# Patient Record
Sex: Female | Born: 1965 | Race: White | Hispanic: No | Smoking: Never smoker
Health system: Southern US, Community
[De-identification: ages and names within clinical notes are randomized; demographics above are authoritative.]

## PROBLEM LIST (undated history)

## (undated) DIAGNOSIS — F329 Major depressive disorder, single episode, unspecified: Secondary | ICD-10-CM

## (undated) DIAGNOSIS — E663 Overweight: Secondary | ICD-10-CM

## (undated) DIAGNOSIS — K589 Irritable bowel syndrome without diarrhea: Secondary | ICD-10-CM

## (undated) DIAGNOSIS — I509 Heart failure, unspecified: Secondary | ICD-10-CM

## (undated) DIAGNOSIS — E8940 Asymptomatic postprocedural ovarian failure: Secondary | ICD-10-CM

## (undated) DIAGNOSIS — J45909 Unspecified asthma, uncomplicated: Secondary | ICD-10-CM

## (undated) DIAGNOSIS — F32A Depression, unspecified: Secondary | ICD-10-CM

## (undated) DIAGNOSIS — G8929 Other chronic pain: Secondary | ICD-10-CM

## (undated) DIAGNOSIS — E785 Hyperlipidemia, unspecified: Secondary | ICD-10-CM

## (undated) DIAGNOSIS — F419 Anxiety disorder, unspecified: Secondary | ICD-10-CM

## (undated) DIAGNOSIS — M549 Dorsalgia, unspecified: Secondary | ICD-10-CM

## (undated) DIAGNOSIS — G35 Multiple sclerosis: Secondary | ICD-10-CM

## (undated) DIAGNOSIS — M797 Fibromyalgia: Secondary | ICD-10-CM

## (undated) DIAGNOSIS — G40909 Epilepsy, unspecified, not intractable, without status epilepticus: Secondary | ICD-10-CM

## (undated) DIAGNOSIS — M1990 Unspecified osteoarthritis, unspecified site: Secondary | ICD-10-CM

## (undated) DIAGNOSIS — K219 Gastro-esophageal reflux disease without esophagitis: Secondary | ICD-10-CM

## (undated) HISTORY — PX: BRAIN SURGERY: SHX531

## (undated) HISTORY — DX: Overweight: E66.3

## (undated) HISTORY — DX: Irritable bowel syndrome, unspecified: K58.9

## (undated) HISTORY — DX: Fibromyalgia: M79.7

## (undated) HISTORY — DX: Depression, unspecified: F32.A

## (undated) HISTORY — PX: TUBAL LIGATION: SHX77

## (undated) HISTORY — DX: Hyperlipidemia, unspecified: E78.5

## (undated) HISTORY — DX: Other chronic pain: G89.29

## (undated) HISTORY — PX: COLONOSCOPY: SHX174

## (undated) HISTORY — DX: Dorsalgia, unspecified: M54.9

## (undated) HISTORY — DX: Unspecified osteoarthritis, unspecified site: M19.90

## (undated) HISTORY — DX: Epilepsy, unspecified, not intractable, without status epilepticus: G40.909

## (undated) HISTORY — DX: Unspecified asthma, uncomplicated: J45.909

## (undated) HISTORY — PX: RHINOPLASTY: SUR1284

## (undated) HISTORY — PX: CHOLECYSTECTOMY: SHX55

## (undated) HISTORY — DX: Anxiety disorder, unspecified: F41.9

## (undated) HISTORY — DX: Asymptomatic postprocedural ovarian failure: E89.40

## (undated) HISTORY — DX: Gastro-esophageal reflux disease without esophagitis: K21.9

## (undated) HISTORY — DX: Major depressive disorder, single episode, unspecified: F32.9

---

## 1994-09-16 HISTORY — PX: VAGINAL HYSTERECTOMY: SUR661

## 2000-09-16 DIAGNOSIS — E8940 Asymptomatic postprocedural ovarian failure: Secondary | ICD-10-CM

## 2000-09-16 HISTORY — PX: SALPINGOOPHORECTOMY: SHX82

## 2000-09-16 HISTORY — DX: Asymptomatic postprocedural ovarian failure: E89.40

## 2001-06-25 ENCOUNTER — Encounter (HOSPITAL_COMMUNITY): Admission: RE | Admit: 2001-06-25 | Discharge: 2001-07-25 | Payer: Self-pay | Admitting: Rheumatology

## 2003-11-15 ENCOUNTER — Inpatient Hospital Stay (HOSPITAL_COMMUNITY): Admission: EM | Admit: 2003-11-15 | Discharge: 2003-11-18 | Payer: Self-pay | Admitting: Emergency Medicine

## 2003-12-12 ENCOUNTER — Ambulatory Visit (HOSPITAL_COMMUNITY): Admission: RE | Admit: 2003-12-12 | Discharge: 2003-12-12 | Payer: Self-pay | Admitting: Internal Medicine

## 2003-12-19 ENCOUNTER — Ambulatory Visit (HOSPITAL_COMMUNITY): Admission: RE | Admit: 2003-12-19 | Discharge: 2003-12-19 | Payer: Self-pay | Admitting: Pulmonary Disease

## 2004-02-17 ENCOUNTER — Ambulatory Visit (HOSPITAL_COMMUNITY): Admission: RE | Admit: 2004-02-17 | Discharge: 2004-02-17 | Payer: Self-pay | Admitting: Internal Medicine

## 2004-03-14 ENCOUNTER — Ambulatory Visit (HOSPITAL_COMMUNITY): Admission: RE | Admit: 2004-03-14 | Discharge: 2004-03-14 | Payer: Self-pay | Admitting: Orthopaedic Surgery

## 2004-08-29 ENCOUNTER — Ambulatory Visit (HOSPITAL_COMMUNITY): Admission: RE | Admit: 2004-08-29 | Discharge: 2004-08-29 | Payer: Self-pay | Admitting: Pulmonary Disease

## 2004-11-06 ENCOUNTER — Ambulatory Visit: Payer: Self-pay | Admitting: Internal Medicine

## 2005-01-08 ENCOUNTER — Ambulatory Visit: Payer: Self-pay | Admitting: Internal Medicine

## 2005-04-24 ENCOUNTER — Ambulatory Visit: Payer: Self-pay | Admitting: Internal Medicine

## 2005-04-29 ENCOUNTER — Ambulatory Visit (HOSPITAL_COMMUNITY): Admission: RE | Admit: 2005-04-29 | Discharge: 2005-04-29 | Payer: Self-pay | Admitting: Internal Medicine

## 2005-06-19 ENCOUNTER — Ambulatory Visit: Payer: Self-pay | Admitting: Internal Medicine

## 2005-08-20 ENCOUNTER — Ambulatory Visit (HOSPITAL_COMMUNITY): Admission: RE | Admit: 2005-08-20 | Discharge: 2005-08-20 | Payer: Self-pay | Admitting: Pulmonary Disease

## 2005-11-14 ENCOUNTER — Ambulatory Visit (HOSPITAL_COMMUNITY): Admission: RE | Admit: 2005-11-14 | Discharge: 2005-11-14 | Payer: Self-pay | Admitting: Pulmonary Disease

## 2005-12-10 ENCOUNTER — Ambulatory Visit (HOSPITAL_COMMUNITY): Admission: RE | Admit: 2005-12-10 | Discharge: 2005-12-10 | Payer: Self-pay | Admitting: *Deleted

## 2006-02-09 ENCOUNTER — Observation Stay (HOSPITAL_COMMUNITY): Admission: EM | Admit: 2006-02-09 | Discharge: 2006-02-10 | Payer: Self-pay | Admitting: Emergency Medicine

## 2006-05-15 ENCOUNTER — Ambulatory Visit (HOSPITAL_COMMUNITY): Admission: RE | Admit: 2006-05-15 | Discharge: 2006-05-15 | Payer: Self-pay | Admitting: Pulmonary Disease

## 2006-07-31 ENCOUNTER — Observation Stay (HOSPITAL_COMMUNITY): Admission: RE | Admit: 2006-07-31 | Discharge: 2006-08-01 | Payer: Self-pay | Admitting: Obstetrics and Gynecology

## 2006-07-31 ENCOUNTER — Encounter (INDEPENDENT_AMBULATORY_CARE_PROVIDER_SITE_OTHER): Payer: Self-pay | Admitting: Specialist

## 2007-11-30 ENCOUNTER — Ambulatory Visit (HOSPITAL_COMMUNITY): Admission: RE | Admit: 2007-11-30 | Discharge: 2007-11-30 | Payer: Self-pay | Admitting: Pulmonary Disease

## 2008-01-13 ENCOUNTER — Ambulatory Visit (HOSPITAL_BASED_OUTPATIENT_CLINIC_OR_DEPARTMENT_OTHER): Admission: RE | Admit: 2008-01-13 | Discharge: 2008-01-13 | Payer: Self-pay | Admitting: *Deleted

## 2008-02-12 ENCOUNTER — Ambulatory Visit: Payer: Self-pay | Admitting: Pulmonary Disease

## 2008-06-28 ENCOUNTER — Ambulatory Visit (HOSPITAL_COMMUNITY): Admission: RE | Admit: 2008-06-28 | Discharge: 2008-06-28 | Payer: Self-pay | Admitting: Orthopaedic Surgery

## 2008-09-13 ENCOUNTER — Ambulatory Visit: Admission: RE | Admit: 2008-09-13 | Discharge: 2008-09-13 | Payer: Self-pay | Admitting: Pulmonary Disease

## 2008-10-04 ENCOUNTER — Other Ambulatory Visit: Admission: RE | Admit: 2008-10-04 | Discharge: 2008-10-04 | Payer: Self-pay | Admitting: Obstetrics & Gynecology

## 2008-10-11 ENCOUNTER — Ambulatory Visit (HOSPITAL_COMMUNITY): Admission: RE | Admit: 2008-10-11 | Discharge: 2008-10-11 | Payer: Self-pay | Admitting: Obstetrics & Gynecology

## 2008-11-10 ENCOUNTER — Ambulatory Visit: Payer: Self-pay | Admitting: Internal Medicine

## 2008-11-22 ENCOUNTER — Encounter: Payer: Self-pay | Admitting: Internal Medicine

## 2008-11-22 ENCOUNTER — Ambulatory Visit (HOSPITAL_COMMUNITY): Admission: RE | Admit: 2008-11-22 | Discharge: 2008-11-22 | Payer: Self-pay | Admitting: Internal Medicine

## 2008-11-22 ENCOUNTER — Ambulatory Visit: Payer: Self-pay | Admitting: Internal Medicine

## 2008-11-22 HISTORY — PX: ESOPHAGOGASTRODUODENOSCOPY: SHX1529

## 2008-11-26 ENCOUNTER — Encounter: Payer: Self-pay | Admitting: Internal Medicine

## 2009-03-13 ENCOUNTER — Ambulatory Visit (HOSPITAL_COMMUNITY): Admission: RE | Admit: 2009-03-13 | Discharge: 2009-03-13 | Payer: Self-pay | Admitting: Pulmonary Disease

## 2009-05-17 ENCOUNTER — Ambulatory Visit (HOSPITAL_BASED_OUTPATIENT_CLINIC_OR_DEPARTMENT_OTHER): Admission: RE | Admit: 2009-05-17 | Discharge: 2009-05-17 | Payer: Self-pay | Admitting: Neurology

## 2010-03-16 ENCOUNTER — Emergency Department (HOSPITAL_COMMUNITY): Admission: EM | Admit: 2010-03-16 | Discharge: 2010-03-17 | Payer: Self-pay | Admitting: Emergency Medicine

## 2010-06-20 ENCOUNTER — Ambulatory Visit: Payer: Self-pay | Admitting: Internal Medicine

## 2010-06-20 DIAGNOSIS — K921 Melena: Secondary | ICD-10-CM

## 2010-06-20 DIAGNOSIS — R1312 Dysphagia, oropharyngeal phase: Secondary | ICD-10-CM

## 2010-06-20 DIAGNOSIS — R110 Nausea: Secondary | ICD-10-CM

## 2010-06-21 ENCOUNTER — Encounter: Payer: Self-pay | Admitting: Internal Medicine

## 2010-06-26 ENCOUNTER — Ambulatory Visit (HOSPITAL_COMMUNITY): Admission: RE | Admit: 2010-06-26 | Discharge: 2010-06-26 | Payer: Self-pay | Admitting: Internal Medicine

## 2010-06-28 ENCOUNTER — Encounter: Payer: Self-pay | Admitting: Internal Medicine

## 2010-07-11 ENCOUNTER — Telehealth (INDEPENDENT_AMBULATORY_CARE_PROVIDER_SITE_OTHER): Payer: Self-pay | Admitting: *Deleted

## 2010-07-11 ENCOUNTER — Encounter: Payer: Self-pay | Admitting: Internal Medicine

## 2010-07-11 LAB — CONVERTED CEMR LAB
ALT: 8 units/L (ref 0–35)
AST: 14 units/L (ref 0–37)
Albumin: 4.2 g/dL (ref 3.5–5.2)
Alkaline Phosphatase: 43 units/L (ref 39–117)
Bilirubin, Direct: 0.1 mg/dL (ref 0.0–0.3)
Indirect Bilirubin: 0.4 mg/dL (ref 0.0–0.9)
Total Bilirubin: 0.5 mg/dL (ref 0.3–1.2)
Total Protein: 6.9 g/dL (ref 6.0–8.3)

## 2010-07-16 ENCOUNTER — Ambulatory Visit (HOSPITAL_COMMUNITY): Admission: RE | Admit: 2010-07-16 | Discharge: 2010-07-16 | Payer: Self-pay | Admitting: Internal Medicine

## 2010-07-23 ENCOUNTER — Ambulatory Visit: Payer: Self-pay | Admitting: Internal Medicine

## 2010-07-23 DIAGNOSIS — K644 Residual hemorrhoidal skin tags: Secondary | ICD-10-CM | POA: Insufficient documentation

## 2010-07-24 ENCOUNTER — Encounter: Payer: Self-pay | Admitting: Gastroenterology

## 2010-07-24 ENCOUNTER — Ambulatory Visit: Payer: Self-pay | Admitting: Internal Medicine

## 2010-07-24 ENCOUNTER — Telehealth (INDEPENDENT_AMBULATORY_CARE_PROVIDER_SITE_OTHER): Payer: Self-pay

## 2010-07-24 LAB — CONVERTED CEMR LAB
Basophils Absolute: 0 10*3/uL (ref 0.0–0.1)
Basophils Relative: 0 % (ref 0–1)
Eosinophils Absolute: 0.1 10*3/uL (ref 0.0–0.7)
Eosinophils Relative: 1 % (ref 0–5)
HCT: 43.6 % (ref 36.0–46.0)
Hemoglobin: 13.7 g/dL (ref 12.0–15.0)
Lymphocytes Relative: 35 % (ref 12–46)
Lymphs Abs: 2.6 10*3/uL (ref 0.7–4.0)
MCHC: 31.4 g/dL (ref 30.0–36.0)
MCV: 98.9 fL (ref 78.0–100.0)
Monocytes Absolute: 0.8 10*3/uL (ref 0.1–1.0)
Monocytes Relative: 11 % (ref 3–12)
Neutro Abs: 3.9 10*3/uL (ref 1.7–7.7)
Neutrophils Relative %: 53 % (ref 43–77)
Platelets: 341 10*3/uL (ref 150–400)
RBC: 4.41 M/uL (ref 3.87–5.11)
RDW: 12.8 % (ref 11.5–15.5)
WBC: 7.4 10*3/uL (ref 4.0–10.5)

## 2010-07-30 ENCOUNTER — Encounter: Payer: Self-pay | Admitting: Internal Medicine

## 2010-10-07 ENCOUNTER — Encounter: Payer: Self-pay | Admitting: Pulmonary Disease

## 2010-10-07 ENCOUNTER — Encounter: Payer: Self-pay | Admitting: Internal Medicine

## 2010-10-12 ENCOUNTER — Encounter (INDEPENDENT_AMBULATORY_CARE_PROVIDER_SITE_OTHER): Payer: Self-pay

## 2010-10-16 NOTE — Letter (Signed)
Summary: RECORDS FROM DR [facility]  RECORDS FROM DR [facility]   Imported By: Rexene Alberts 06/21/2010 16:03:11  _____________________________________________________________________  External Attachment:    Type:   Image     Comment:   External Document

## 2010-10-16 NOTE — Letter (Signed)
Summary: CT SCAN ORDER  CT SCAN ORDER   Imported By: Ave Filter 06/20/2010 12:04:12  _____________________________________________________________________  External Attachment:    Type:   Image     Comment:   External Document

## 2010-10-16 NOTE — Miscellaneous (Signed)
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 Thanks.  Appended Document: Orders Update LMOM to call.  Appended Document: Orders Update Please f/u on this. Pt has not had labs done.  Appended Document: Orders Update LFTs from September 2011 scanned into EMR. WNL.

## 2010-10-16 NOTE — Progress Notes (Signed)
Summary: vomiting  Phone Note Call from Patient Call back at Home Phone [phone]   Caller: Patient Summary of Call: pt came to office- yesterday after she picked up her child from school, she got something to eat and started having abd pain and a fever. She layed down and took a nap from 230-730 and when she woke up, she started vomiting and it looked like there was blood in it. Her ifobt is positive. please advise Initial call taken by: Hendricks Limes LPN,  July 24, 2010 8:53 AM     Appended Document: vomiting pt reports fevers but does not actually take temp. personally saw pt today in exam room, as she brought in emesis for [place] to view. on rectal exam at office, visit, +hemorrhoids, ifobt positive, likely hemorrhoids source. continues to c/o abdominal pain. emesis had very scant amount of pinkish tinged to contents. has stool studies orderd by dr. Juanetta Gosling, was dropping these off today. started on a ppi at office visit a few days ago. needs time for PPI to take effect. sent pt to have cbc drawn as was very anxious about test results and emesis.

## 2010-10-16 NOTE — Assessment & Plan Note (Signed)
Summary: OV TO REASSESS BOWEL FUNCTION/SS   Visit Type:  Follow-up Visit Primary Care Provider:  Juanetta Gosling  CC:  F/U bowel bunctions.  History of Present Illness: Colleen Sellers is a 45 year old Caucasian female who is well known to our practice. She presents today in follow-up from a CT and UGI/BPE. These were done secondary to reported abdominal pain and dysphagia, she had recently had an EGD/colon  by Dr. Jena Gauss on 11/22/08 due to symptoms of alternating constipation and diarrhea, intermittent bpr, dysphagia, reflux. EGD showed a noncritical Schatzki ring, otherwise normal. Colonoscopy showed a friable anal canal and scatterered pancolic diverticula, otherwise normal. At her visit in mid-october, she reported lower abdominal pain, yet today she reports no lower abdominal pain, only upper abdominal/epigastric pain. She states that for the past three months she experiences crampy pain 30-45 minutes after eating. +nausea. c/o underlying constant "achy" pain. Reports BM daily or every other day as yellow/"pinkish hamburger meat". She is down 7 lbs to 177 from mid October, as she states she is afraid to eat secondary to pain. however, her weight is maintining in the higher 170s to low 180s after reviewing records. CT was essentially normal yet showed mild CBD dilation, LFTs normal. UGI/BPE showed normal esophagus motility, near absence of normal rugal folds suggestive of atrophic gastritis.   Current Medications (verified): 1)  Lorcet 10/650 10-650 Mg Tabs (Hydrocodone-Acetaminophen) .... As Needed 2)  Zyrtec Allergy 10 Mg Caps (Cetirizine Hcl) .... Once Daily 3)  Lasix 40 Mg Tabs (Furosemide) .... As Needed 4)  Oxycodone Hcl 5 Mg Tabs (Oxycodone Hcl) .... At Bedtime 5)  Depakote 500 Mg Tbec (Divalproex Sodium) .... Once Daily 6)  Alprazolam 1 Mg Tabs (Alprazolam) .... At Bedtime 7)  Adderall 20 Mg Tabs (Amphetamine-Dextroamphetamine) .Marland Kitchen.. 1 By Mouth Two Times A Day  Allergies (verified): 1)  ! Codeine 2)   ! Morphine  Past History:  Past Medical History: Last updated: 06/20/2010 IBS Seizure disorder Depression Fibromyalgia Chronic back pain Migraine Vertigo DDD Fatigue mild narcolepsy GERD scoliosis  Past Surgical History: Last updated: 06/20/2010 rectocele 07 total hysterectomy cholecystectomy tubal ligation mass in frontal sinus cavity, removed through nose, benign  deviated septum   Review of Systems General:  Denies fever, chills, and anorexia. Eyes:  Denies blurring, irritation, and discharge. ENT:  Denies sore throat, hoarseness, and difficulty swallowing. CV:  Denies chest pains, palpitations, and syncope. Resp:  Denies dyspnea at rest, cough, and wheezing. GI:  Complains of nausea and abdominal pain; denies indigestion/heartburn, diarrhea, and constipation. GU:  Denies urinary burning, blood in urine, and urinary frequency. Colleen:  Denies joint pain / LOM, joint swelling, and joint stiffness. Derm:  Denies rash, itching, and dry skin. Neuro:  Denies weakness, paralysis, and frequent headaches. Psych:  Denies depression and anxiety. Endo:  Denies cold intolerance and heat intolerance.  Vital Signs:  Patient profile:   45 year old female Height:      69 inches Weight:      177.50 pounds BMI:     26.31 Temp:     97.5 degrees F oral Pulse rate:   64 / minute BP sitting:   102 / 80  (left arm) Cuff size:   regular  Vitals Entered By: Cloria Spring LPN (July 23, 2010 11:29 AM)  Physical Exam  General:  Well developed, well nourished, no acute distress. Lungs:  Clear throughout to auscultation. Heart:  Regular rate and rhythm; no murmurs, rubs,  or bruits. Abdomen:  normal bowel sounds, epigastric  tenderness, without guarding, without rebound, and no hepatomegally or splenomegaly.   Rectal:  external hemorrhoid.    Impression & Recommendations:  Problem # 1:  ABDOMINAL PAIN OTHER SPECIFIED SITE (ICD-789.09)  Colleen Sellers is a 45 year old female with a  hx of chronic abdominal pain,N/V. she was seen in october with c/o lower abdominal pain and bloating. 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 For past three months she now reports crampy pain 30-45 minutes after eating. +nausea. c/o underlying constant "achy" pain. Reports BM daily or every other day as yellow/"pinkish hamburger meat". Not on PPI. Diff dx: gastritis, peptic ulcer disease, esophagitis  Nexium 40 mg daily ifobt to check for occult blood  Return in 3 mos Pt will contact us if upper abdominal pain does not resolve with PPI therapy Will follow-up in ifobt  Orders: Est. Patient Level III ([address])  Problem # 2:  NAUSEA (ICD-787.02) Likely secondary to #1. See plan.   Problem # 3:  HEMORRHOIDS-EXTERNAL (ICD-455.3)  Pt with hx of hemorrhoid surgery, rectal exam + for hemorrhoids. LIkely source of possible reported hematochezia (pinkish hamburger meat stool reportedly).  ifobt Anusol suppositories contact us if persists or worsens Return in 3 mos  Orders: Est. Patient Level III ([address]) Prescriptions: ANUSOL-HC 25 MG SUPP (HYDROCORTISONE ACETATE) one per rectum twice a day as needed  #20 x 0   Entered and Authorized by:   Gerrit Halls NP   Signed by:   Gerrit Halls NP on 07/23/2010   Method used:   Faxed to ...       [facility]  [address] # [phone]* (retail)       [address]       [place], [place]  [address]       Ph: [phone] or [phone]       Fax: [phone]   RxID:   [id number] NEXIUM 40 MG CPDR (ESOMEPRAZOLE MAGNESIUM) take one by mouth daily  #30 x 3   Entered and Authorized by:   Gerrit Halls NP   Signed by:   Gerrit Halls NP on 07/23/2010   Method used:   Faxed to ...       [facility]  [address] # [phone]* (retail)       [address]       [place], [place]  [address]       Ph: [phone] or [phone]       Fax: [phone]   RxID:   [id number]   Appended Document: OV TO REASSESS BOWEL  FUNCTION/SS 3 MONTH F/U REMINDER IS IN THE COMPUTER

## 2010-10-16 NOTE — Progress Notes (Signed)
Summary: pt doesnt understand why she needs this test done  Phone Note Call from Patient   Summary of Call: Pt called unsure what her test was about that is scheduled for 10/31. She also asked about her blood work weather or not to get it done. I told her if the doctor had ordered it to go ahead and have the blood work done and I would have CM call her when she gets in to explain why she needs this test done. [phone] Initial call taken by: Karina Eves,  July 11, 2010 1:23 PM     Appended Document: pt doesnt understand why she needs this test done I called pt no answer, lmom.  Appended Document: pt doesnt understand why she needs this test done test needed to further evaluate dysphagia and abd pain per lsl  Appended Document: pt doesnt understand why she needs this test done Actually, pt seen by AS.  Appended Document: pt doesnt understand why she needs this test done [facility] to call.   Appended Document: pt doesnt understand why she needs this test done Pt called and wanted her results and I told her they are not in yet.  Appended Document: pt doesnt understand why she needs this test done pt aware of appt for 07/23/10 @ 11 w/LSL

## 2010-10-16 NOTE — Assessment & Plan Note (Signed)
Summary: ABD PAIN/DISCOLORED STOOLS/LAW   Visit Type:  Follow-up Consult Referring Seanmichael Salmons:  Gerilyn Pilgrim Primary Care Janila Arrazola:  Juanetta Gosling  Chief Complaint:  abd pain and discolored stools.  History of Present Illness: Colleen Sellers is a 45 year old Caucasian female who is well known to our practice. She has a history of chronic abdominal pain, N/V, constipation, and diarrhea. She was referred by her neurologist, Dr Gerilyn Pilgrim, to be evaluated for abdominal bloating, pain. Pt presented to his office with symptoms of lower abdominal pain, almost suprapubic, and "dark stools". Last EGD/colonoscopy was done by Dr. Jena Gauss on 11/22/08 due to symptoms of alternating constipation and diarrhea, intermittent bpr, dysphagia, reflux. EGD showed a noncritical Schatzki ring, otherwise normal. Colonoscopy showed a friable anal canal and scatterered pancolic diverticula, otherwise normal. Pt's main complaint today is of a 2-3 months hx of stabbing, intermittent lower abdominal pain, "like having a baby", not associated with eating/drinking, exacerbated by movement, and unrelieved by BM. States she "breaks into a sweat when using bathroom" and feels like she will "pass out". Reports daily BM, described as "balls" at first then soft. States stool appear to look like "hambuger meat", "pinkish/red". Reported wiping last night and saw a quarter-sized amount of bright red blood on tissue. States has lost 2 pant sizes over the past few months. Weight actually up about 5 pounds from last documented weight. +nausea, loss of appetite, has to "force" herself to eat. C/o oropharygneal dysphagia with pills or tough textures. Labs from Dr. Gerilyn Pilgrim with chart from 05/28/10. LFTs, amylase normal. Will be scanned into EMR.   Current Medications (verified): 1)  Lorcet 10/650 10-650 Mg Tabs (Hydrocodone-Acetaminophen) .... As Needed 2)  Zyrtec Allergy 10 Mg Caps (Cetirizine Hcl) .... Once Daily 3)  Lasix 40 Mg Tabs (Furosemide) .... As Needed 4)   Oxycodone Hcl 5 Mg Tabs (Oxycodone Hcl) .... At Bedtime 5)  Depakote 500 Mg Tbec (Divalproex Sodium) .... Once Daily 6)  Alprazolam 1 Mg Tabs (Alprazolam) .... At Bedtime 7)  Adderall 20 Mg Tabs (Amphetamine-Dextroamphetamine) .Marland Kitchen.. 1 By Mouth Two Times A Day  Allergies: 1)  ! Codeine 2)  ! Morphine  Past History:  Past Medical History: IBS Seizure disorder Depression Fibromyalgia Chronic back pain Migraine Vertigo DDD Fatigue mild narcolepsy GERD scoliosis  Past Surgical History: rectocele 07 total hysterectomy cholecystectomy tubal ligation mass in frontal sinus cavity, removed through nose, benign  deviated septum  Family History: maternal Grandfather: colon ca                                     breast ca                                     uterine cancer  Paternal grandparents: uterine cancer  Dad deceased: renal failure Mom deceased: suicide Siblings: gum cancer               HTN                                        Social History: Patient has never smoked.  Alcohol Use - no Daily Caffeine Use: 2 cups of coffee in am Smoking Status:  never  Review of Systems General:  Complains of fever, chills, and anorexia;  not documented by thermometer reading. Eyes:  Denies blurring, irritation, and discharge. ENT:  Complains of difficulty swallowing; denies sore throat and hoarseness. CV:  Denies chest pains, angina, and dyspnea on exertion. Resp:  Denies dyspnea at rest, cough, and wheezing. GI:  Complains of difficulty swallowing, nausea, and bloody BM's; denies indigestion/heartburn. GU:  Denies urinary burning, blood in urine, and urinary frequency. MS:  Denies joint pain / LOM and joint swelling. Derm:  Denies rash, itching, and dry skin. Neuro:  Denies weakness and paralysis. Psych:  Denies anxiety and confusion.  Vital Signs:  Patient profile:   45 year old female Height:      69 inches Weight:      184 pounds BMI:     27.27 Temp:     97.7  degrees F oral Pulse rate:   72 / minute BP sitting:   122 / 82  (left arm) Cuff size:   regular  Vitals Entered By: Hendricks Limes LPN (June 20, 2010 10:52 AM)  Physical Exam  General:  Well developed, well nourished, no acute distress. Head:  Normocephalic and atraumatic. Eyes:  conjuctiva clear, no icterus Mouth:  No deformity or lesions, dentition normal. Neck:  Supple; no masses or thyromegaly. Lungs:  Clear throughout to auscultation. Heart:  Regular rate and rhythm; no murmurs, rubs,  or bruits. Abdomen:  normal bowel sounds, without guarding, and no hepatomegally or splenomegaly.  tender to palpation lower abdomen. +Carnett's sign.  Msk:  Symmetrical with no gross deformities. Normal posture. Pulses:  Normal pulses noted. Extremities:  No clubbing, cyanosis, edema or deformities noted. Neurologic:  Alert and  oriented x4;  grossly normal neurologically.  Impression & Recommendations:  Problem # 1:  ABDOMINAL PAIN OTHER SPECIFIED SITE (ICD-37.56)  45 year old Caucasian female with long-standing history of chronic abdominal pain. Last EGD/Colonoscopy done 11/22/08 with relatively normal findings. Has noted for the past 2-3 months a new onset of intermittent, lower abdominal, "like having a baby", unrelated to eating or drinking, not relieved by anything. +Carnett's sign.  As EGD/Colonoscopy done in 2010, we will proceed with a CT of abd/pelvis to assess any intra-abdominal abnormalities.  If CT is normal, may need to proceed with colonoscopy.  Orders: Consultation Level IV ([address])  Problem # 2:  HEMATOCHEZIA (ICD-578.1)  2-3 month history of bloody stools. Noted small amount of fresh blood on tissue last night, quarter-sized amount. Denies hemorrhoids. Last colon/egd 2010.  See #1  Orders: Consultation Level IV (60454)  Problem # 3:  NAUSEA (ICD-787.02)  Nausea X 2-3 months. Although reportedly down 2 pant sizes, unable to assess actual weight change via  documentation.  Likely related to abdominal pain.  Pt informed we would assess CT first, then proceed with further work-up if CT negative.   Orders: Consultation Level IV (09811)  Problem # 4:  DYSPHAGIA OROPHARYNGEAL PHASE (ICD-787.22)  Dysphagia noted when swallowing pills, tough textures. No odonyphagia. Denies reflux. Last EGD 11/2008, no reported dilatation performed, non-critical Schatzki ring.  Continue current plan of care. Informed patient we will assess abdominal pain with CT, review results. Will most likely need UGI/BPE since last EGD somewhat recent. Pt informed of this and stated understanding.  Orders: Consultation Level IV (91478)  Patient Instructions: 1)  CT of abd/pelvis 2)  Possbile UGI/BPE in near future after abdominal pain addressed I would like to thank Dr. Gerilyn Pilgrim for allowing [place] to take part in the care of this nice patient.   Appended Document: ABD PAIN/DISCOLORED STOOLS/LAW Please set  up for an UGI/BPE secondary to dysphagia, recent endoscopy 2010. Pt was informed at ov that this may be the next step.   Appended Document: ABD PAIN/DISCOLORED STOOLS/LAW Pt to be set up for office visit to reassess bowel function.   Appended Document: ABD PAIN/DISCOLORED STOOLS/LAW Pt scheduled for 07/16/10@8 :30am  Appended Document: ABD PAIN/DISCOLORED STOOLS/LAW Please schedule OV as per Anna's request.   Appended Document: ABD PAIN/DISCOLORED STOOLS/LAW lmom for return call

## 2010-10-16 NOTE — Letter (Addendum)
Summary: UGI/BPE ORDER  UGI/BPE ORDER   Imported By: Ave Filter 07/11/2010 09:32:57  _____________________________________________________________________  External Attachments:     1. Type:   Image          Comment:   External Document    2. Type:   Image          Comment:   External Document    3. Type:   Image          Comment:   External Document  Appended Document: UGI/BPE ORDER Called pt lmom for pt to return my call.

## 2010-10-16 NOTE — Miscellaneous (Signed)
Summary: Orders Update  Clinical Lists Changes  Orders: Added new Test order of T-CBC w/Diff ([id number]) - Signed 

## 2010-10-16 NOTE — Assessment & Plan Note (Signed)
Summary: DROPPED OFF STOOL/SS  pt returned ifobt and it was positive  Allergies: 1)  ! Codeine 2)  ! Morphine  Other Orders: Immuno-chemical Fecal Occult ([address])  Appended Document: DROPPED OFF STOOL/SS noted; needed cbc just prior to ov in 3 mos  Appended Document: DROPPED OFF STOOL/SS Pt aware of the above. Lab order on file for 10/17/2010.

## 2010-10-18 ENCOUNTER — Encounter (INDEPENDENT_AMBULATORY_CARE_PROVIDER_SITE_OTHER): Payer: Self-pay | Admitting: *Deleted

## 2010-10-18 NOTE — Letter (Signed)
Summary: Recall, Labs Needed  [facility]  [address]   [place], [place] [address]   Phone: [phone]  Fax: [phone]    October 12, 2010  Colleen Sellers [address] [place], [place]  [address] August 31, 1966   Dear Ms. Uppal,   Our records indicate it is time to repeat your blood work.  You can take the enclosed form to the lab on or near the date indicated.  Please make note of the new location of the lab:   [address], 2nd floor   [facility]  Our office will call you within a week to ten business days with the results.  If you do not hear from [place] in 10 business days, you should call the office.  If you have any questions regarding this, call the office at [phone], and ask for the nurse.  Labs are due on 10/17/10.   Sincerely,    Hendricks Limes LPN  [facility] Ph: [phone]   Fax: [phone]

## 2010-10-24 NOTE — Letter (Signed)
Summary: Recall Office Visit  [facility]  [address]   [place], [place] [address]   Phone: [phone]  Fax: [phone]      October 18, 2010   Colleen Sellers [address] [place], [place]  [address] 01-24-1966   Dear Ms. Sanker,   According to our records, it is time for you to schedule a follow-up office visit with [place].   At your convenience, please call [phone] to schedule an office visit. If you have any questions, concerns, or feel that this letter is in error, we would appreciate your call.   Sincerely,    Belem Eves  [facility] Ph: [phone]   Fax: [phone]

## 2010-12-02 LAB — CBC
MCH: 32.2 pg (ref 26.0–34.0)
MCHC: 34.3 g/dL (ref 30.0–36.0)
MCV: 93.7 fL (ref 78.0–100.0)
Platelets: 247 10*3/uL (ref 150–400)
RDW: 12.5 % (ref 11.5–15.5)
WBC: 6.8 10*3/uL (ref 4.0–10.5)

## 2010-12-02 LAB — URINALYSIS, ROUTINE W REFLEX MICROSCOPIC
Bilirubin Urine: NEGATIVE
Glucose, UA: NEGATIVE mg/dL
Hgb urine dipstick: NEGATIVE
Nitrite: NEGATIVE
Protein, ur: NEGATIVE mg/dL
Specific Gravity, Urine: 1.02 (ref 1.005–1.030)
Urobilinogen, UA: 0.2 mg/dL (ref 0.0–1.0)
pH: 7 (ref 5.0–8.0)

## 2010-12-02 LAB — BASIC METABOLIC PANEL
BUN: 8 mg/dL (ref 6–23)
CO2: 28 mEq/L (ref 19–32)
Chloride: 106 mEq/L (ref 96–112)
Creatinine, Ser: 0.94 mg/dL (ref 0.4–1.2)
Glucose, Bld: 114 mg/dL — ABNORMAL HIGH (ref 70–99)

## 2010-12-02 LAB — DIFFERENTIAL
Basophils Absolute: 0 10*3/uL (ref 0.0–0.1)
Eosinophils Absolute: 0.3 10*3/uL (ref 0.0–0.7)
Eosinophils Relative: 5 % (ref 0–5)
Monocytes Absolute: 0.9 10*3/uL (ref 0.1–1.0)

## 2010-12-02 LAB — SEDIMENTATION RATE: Sed Rate: 7 mm/hr (ref 0–22)

## 2011-01-29 NOTE — Procedures (Signed)
NAMEMERRANDA, Colleen Sellers NO.:  [id number]   MEDICAL RECORD NO.:  [id number]          PATIENT TYPE:  OUT   LOCATION:  SLEEP CENTER                 FACILITY:  [facility]   PHYSICIAN:  Barbaraann Share, MD,FCCPDATE OF BIRTH:  Dec 07, 1963   DATE OF STUDY:  01/13/2008                            NOCTURNAL POLYSOMNOGRAM   REFERRING PHYSICIAN:   LOCATION:  Sleep lab.   REFERRING PHYSICIAN:  Estanislado Pandy, M.D.   INDICATIONS FOR THE STUDY:  Hypersomnia with sleep apnea.   EPWORTH SLEEPINESS SCORE:  16.   SLEEP ARCHITECTURE:  Patient had total sleep time of 386 minutes with no  slow wave sleep and decreased quantity of REM.  Sleep onset latency was  slightly increased at 33 minutes, and REM onset was normal at 66  minutes.  Sleep efficiency was mildly decreased at 87%.   RESPIRATORY DATA:  Patient was found to have 3 obstructive apneas, 8  central apneas, and 4 obstructive hypopneas for an apnea-hypopnea index  of 2.3 events per hour.  The events were not positional, nor were they  REM-predominant.  Minimal snoring was noted during this study.   OXYGEN DATA:  Patient had O2 desaturation as low as 87% with her  obstructive events.   CARDIAC DATA:  No clinically significant arrhythmias were noted.   MOVEMENT-PARASOMNIA:  Patient had no significant leg jerks or abnormal  behaviors.   IMPRESSIONS-RECOMMENDATIONS:  1. Small numbers of obstructive and central events which do not meet      the apnea-hypopnea index criteria for the obstructive sleep apnea      syndrome.  There was transient oxygen desaturation to 87% with the      small numbers of      events.  2. No significant leg jerks or abnormal behaviors noted.      Barbaraann Share, MD,FCCP  Diplomate, American Board of Sleep  Medicine  Electronically Signed     [facility]  D:  02/16/2008 18:30:49  T:  02/16/2008 19:00:45  Job:  [id number]

## 2011-01-29 NOTE — Op Note (Signed)
NAMEADLEAN, HARDEMAN                 ACCOUNT NO.:  [id number]   MEDICAL RECORD NO.:  [id number]          PATIENT TYPE:  AMB   LOCATION:  DAY                           FACILITY:  [facility]   PHYSICIAN:  R. Roetta Sessions, M.D. DATE OF BIRTH:  1965-12-03   DATE OF PROCEDURE:  11/22/2008  DATE OF DISCHARGE:                               OPERATIVE REPORT   PROCEDURE PERFORMED:  Esophagogastroduodenoscopy with small bowel biopsy  followed by diagnostic colonoscopy.   INDICATIONS FOR PROCEDURE:  The patient is a 45 year old lady with  alternating constipation and diarrhea, now with intermittent blood per  rectum.  She describes frequent regurgitation, reflux symptoms, no  odynophagia, no dysphagia.  She is concerned that her altered bowel  habits may be related to celiac disease and wants to be checked.  She  has not been on any acid suppression therapy recently.   Esophagogastroduodenoscopy and colonoscopy now being done to further  evaluate her symptoms.  This approach has been discussed with the  patient at length.  Potential risks, benefits, alternatives and  limitations have been reviewed.  Her last colonoscopy was in 2001 for  intermittent rectal bleeding.  She was found to have a normal rectum and  terminal ileum as well as colon.   Oxygen saturations, blood pressure, pulse and respirations were  monitored throughout the entirety of the procedure.   CONSCIOUS SEDATION:  Versed 10 mg IV, Demerol 150 mg IV in divided  doses.  Cetacaine spray for topical pharyngeal anesthesia.   INSTRUMENT USED:  Pentax video chip system.   FINDINGS:  Esophagogastroduodenoscopy examination of the tubular  esophagus revealed a noncritical Schatzki ring.  The overlying  esophageal mucosa appeared entirely normal.  Esophagogastric junction  was easily traversed.   Stomach:  The gastric cavity was empty and insufflated well with air.  Thorough examination of the gastric mucosa including retroflex view of  the proximal stomach, esophagogastric junction demonstrated a small  hiatal hernia and a couple of tiny antral erosions.  Otherwise, the  gastric mucosa appeared entirely normal.  Pylorus was patent and easily  traversed.  Examination of the bulb, second and third portion revealed  no abnormalities.   THERAPY/DIAGNOSTIC MANEUVERS PERFORMED:  Biopsy of the second and third  portion of duodenum were taken for histologic study.  The patient  tolerated the procedure well and was prepared for colonoscopy.   Digital rectal exam revealed no abnormalities.   ENDOSCOPIC FINDINGS:  Prep was adequate.  Colon:  Colonic mucosa was surveyed from the rectosigmoid junction to  the left, transverse and right colon to the area of the appendiceal  orifice, ileocecal valve and cecum.  These structures were well seen and  photographed for the record.  The terminal ileum was intubated to 10 cm.  From this level was slowly and cautiously withdrawn.  All previously  mentioned mucosal surfaces were again seen.  The patient was noted to  have scattered pancolonic diverticula.  Otherwise, the colonic mucosa as  well as the terminal ileal mucosa appeared entirely normal.  Scope was  pulled down in the rectum where  a thorough examination of the rectal  mucosa including a retroflex view of the anal verge and en face view of  the anal canal demonstrated only friable anal canal.  The patient  tolerated both procedures well and was reacted in endoscopy.   IMPRESSION:  Esophagogastroduodenoscopy:  Noncritical Schatzki's ring,  otherwise normal esophagus.  Small hiatal hernia.  Tiny antral erosions  without clinical significance.  Otherwise, normal stomach, normal D1  through D3, status post biopsy of D2 and D3.  Colonoscopy:  Friable anal canal.  Otherwise normal rectum.  Scattered  pancolonic diverticula.  Remainder of colonic mucosa and terminal ileal  mucosa appeared normal.   RECOMMENDATIONS:  1. Begin Zegerid  40 mg orally daily 30 minutes before breakfast.      Prescription given.  2. Begin fiber supplement with Benefiber 1 tablespoon daily.  3. 10-day course of Anusol HC suppositories 1 per rectum at bedtime.  4. Follow up on pathology.  5. Further recommendations to follow.      Jonathon Bellows, M.D.  Electronically Signed     RMR/MEDQ  D:  11/22/2008  T:  11/22/2008  Job:  [id number]   cc:   Ramon Dredge L. Juanetta Gosling, M.D.  Fax: [phone]

## 2011-01-29 NOTE — Procedures (Signed)
NAMENECHUMA, BOVEN                 ACCOUNT NO.:  [id number]   MEDICAL RECORD NO.:  [id number]          PATIENT TYPE:  OUT   LOCATION:  SLEE                          FACILITY:  [facility]   PHYSICIAN:  Kofi A. Gerilyn Pilgrim, M.D. DATE OF BIRTH:  [birth year]   DATE OF PROCEDURE:  DATE OF DISCHARGE:  09/13/2008                             SLEEP DISORDER REPORT   NOCTURNAL POLYSOMNOGRAPHY REPORT:   REFERRING PHYSICIAN:  Edward L. Juanetta Gosling, MD   The recording date September 13, 2008.   INDICATIONS:  A 42-year lady who presents with snoring, hypersomnia, and  is being evaluated for obstructive sleep apnea syndrome.   MEDICATIONS:  Estrogen, hydrocodone, topiramate, and Zyrtec.   Epworth sleepiness scale 14 and BMI 26.   ARCHITECTURAL SUMMARY:  The total recording time is 387 minutes.  The  stage N1 7.2%, N2 68%, N3 6% and REM sleep 19% respiratory; the baseline  oxygen saturation is 98%, lowest saturation 94%.  The AHI is 0.   LEG MOVEMENT SUMMARY:  PLM index 0.   ELECTROCARDIOGRAM SUMMARY:  Average heart rate 51 with isolated PVCs  observed.   IMPRESSION:  This is an unremarkable nocturnal polysomnography.      Kofi A. Gerilyn Pilgrim, M.D.  Electronically Signed     KAD/MEDQ  D:  09/18/2008  T:  09/19/2008  Job:  [id number]

## 2011-01-29 NOTE — Procedures (Signed)
NAMECLOEY, SFERRAZZA                 ACCOUNT NO.:  [id number]   MEDICAL RECORD NO.:  [id number]          PATIENT TYPE:  OUT   LOCATION:  SLEE                         FACILITY:  [facility]   PHYSICIAN:  Kofi A. Gerilyn Pilgrim, M.D. DATE OF BIRTH:  Jun 25, 1966   DATE OF PROCEDURE:  05/17/2009  DATE OF DISCHARGE:  05/17/2009                             SLEEP DISORDER REPORT   INDICATIONS:  This is a 43-year lady who presents with hypersomnia.  She  has had a previous polysomnography, which was unremarkable.   MEDICATIONS:  Hydrocodone, Singulair, Depakote, Zyrtec, omeprazole,  estradiol, and Prilosec.   EPWORTH SLEEPINESS SCALE:  18.   BMI:  27.   ARCHITECTURE SUMMARY:  This is a multiple sleep latency test.  The test  is conducted after the patient was given a sleep logs and well rested.  Previous sleep study was unrevealing.  The patient had a total of 5 naps  with the mean sleep latency 4.9.  There was one sleep onset REM cycle.   IMPRESSION:  This study provide objective evidence for severe daytime  sleepiness, but does not meets the criteria for narcolepsy.      Kofi A. Gerilyn Pilgrim, M.D.  Electronically Signed     KAD/MEDQ  D:  05/19/2009  T:  05/20/2009  Job:  [id number]

## 2011-01-29 NOTE — Consult Note (Signed)
NAMEHILARY, MILKS                 ACCOUNT NO.:  [id number]   MEDICAL RECORD NO.:  [id number]          PATIENT TYPE:  AMB   LOCATION:  DAY                           FACILITY:  [facility]   PHYSICIAN:  R. Roetta Sessions, M.D. DATE OF BIRTH:  1966/01/10   DATE OF CONSULTATION:  11/10/2008  DATE OF DISCHARGE:                                 CONSULTATION   REASON FOR CONSULTATION:  Abdominal pain, diarrhea, and rectal bleeding.   HISTORY OF PRESENT ILLNESS:  Colleen Sellers is a pleasant 45 year old Caucasian female with a  long history of intermittent postprandial abdominal cramps and diarrhea  punctuated by long periods of constipation.  She has had some  intermittent blood per rectum lately with diarrhea.  Over the past 2  weeks, however, she has been constipated and has gone 2-3 days without a  bowel movement.  She at times passes some bright red blood per rectum.   She also complains of some difficulty with food coming back in the back  of her throat the morning after she has consumed the evening meal.  She  goes to bed about 4 hours after she eats.  She has not had any true  odynophagia or dysphagia.  She has not had any nausea or vomiting.  Her  gallbladder is out.   There is no family history of GI neoplasia.  She tells me she has more  problems when she drinks beer or has anything with whole wheat.  She has  abdominal bloating and tend to have more diarrhea and she actually has  actually asked me about celiac disease.  It is notable she did have a  negative celiac panel she saw [place] back in 2006.  She also had a small-  bowel follow-through which demonstrated normal appearing mucosa with a  rapid transit..  She did have a sigmoidoscopy by Dr. Loreta Ave back in 2005  for nausea, vomiting, and some bloody diarrhea.  She was found to have  small external hemorrhoids.  Back in 2001, she underwent a colonoscopy  for intermittent rectal bleeding.  She was found have a normal rectum  and  terminal ileum and normal appearing colon.   PAST MEDICAL HISTORY:  Significant for irritable bowel syndrome, seizure  disorder, depression, fibromyalgia, status post cholecystectomy,  hysterectomy, tubal ligation, chronic back pain.   CURRENT MEDICATIONS:  Lorcet p.r.n., Topamax 100 mg daily, estradiol 10  mg daily, Zyrtec 10 mg daily.   ALLERGIES:  Codeine.   FAMILY HISTORY:  Mother committed suicide.  Father had a leg amputation.  No history chronic GI or liver illness.   SOCIAL HISTORY:  Patient is divorced and has four children, three which  are grown, in good health.  She is unemployed.  No tobacco.  Rarely  consumes alcohol.   REVIEW OF SYSTEMS:  No chest pain.  No dyspnea on exertion.  She has  lost weight over the past 9 years.  She was 204 pounds back in 2001.  She is 178 pounds today.  No night sweats.  No fever or chills.   PHYSICAL  EXAMINATION:  A pleasant 45 year old lady resting comfortably.  Weight 178, height 5 feet 9 and a half inches, temperature 97.7, BP  112/70, pulse 56.  SKIN:  Warm and dry.  HEENT:  No scleral icterus.  Conjunctivae pink.  CHEST:  Lungs are clear to auscultation.  CARDIAC:  Regular rate and rhythm without murmur, gallop or rub.  ABDOMEN:  Flat, positive bowel sounds, soft, nontender without  appreciable mass or organomegaly.  EXTREMITIES:  No edema.  RECTAL:  Exam deferred until time of colonoscopy.   IMPRESSION:  Colleen Sellers is a pleasant 45 year old lady with altering  constipation and diarrhea, now with intermittent blood per rectum which  she has had intermittently over the years.  She also describes food  coming up in the back of her throat the next morning, the same food she  consumed the evening before.  She is concerned about celiac disease.  She did have a negative celiac panel previously.   She really does not have any typical gastroesophageal reflux disease  symptoms and she had no evidence of inflammatory bowel disease on  prior  sigmoidoscopy, colonoscopy or small-bowel follow-through although it has  been several years since she has been evaluated.   I told Colleen Sellers we ought to go ahead and evaluate her upper GI tract  with an EGD and go ahead and repeat colonoscopy.  Risks, benefits,  alternatives, and limitations have been reviewed and questions answered.  She is agreeable.  We will make further recommendations in the very near  future.   I would like to thank Dr. Shaune Pollack for allowing me to see this nice  lady once again in consultation.      Colleen Sellers, M.D.  Electronically Signed     RMR/MEDQ  D:  11/10/2008  T:  11/10/2008  Job:  [id number]

## 2011-02-01 NOTE — Consult Note (Signed)
[facility]  Patient:    Colleen Sellers, Colleen Sellers Visit Number: [id number] MRN: [id number]          Service Type: RHE Location: [facility] Attending Physician:  Aundra Dubin Dictated by:   Aundra Dubin, M.D. Proc. Date: 06/25/01 Admit Date:  06/25/2001                            Consultation Report  CHIEF COMPLAINT:  Arthritis.  Dear Renae Fickle:  Thank you for this consultation.  Ms. Colleen Sellers is a 45 year old white female who has had a long history of back pain.  Worked with [facility] in 1994.  She was evaluated by Dr. Venetia Maxon during the spring of 2002.  She tells me an MRI was taken and that she has bulging disks and arthritis.  Her back is her worst area of achiness and is in the low back. She says this is a "continuous ache."  She is here also for an evaluation of possible fibromyalgia.  She has achiness all over that she says is flu like. She has headaches almost every day.  She is quite tired and fatigued.  She describes her energy level as being "exhausted."  She has a diagnosis of irritable bowel syndrome for about four years.  There has been no swollen joints.  She is stiff in the mornings for two hours and awakens unrested.  REVIEW OF SYSTEMS:  She describes having low grade fevers "off and on."  Her weight is down 22 pounds and she has no explanation for this.  She says her appetite is normal.  She denies rashes, psoriasis, photosensitivity, oral ulcers, alopecia, pleurisy, Raynauds.  There has been no blood or mucus to the bowel movement.  She denies chest pain or shortness of breath.  PAST MEDICAL HISTORY/PAST SURGICAL HISTORY:  Depression.  Her mother committed suicide when she was 11 years of age.  She was treated with antidepressants at that time.  She named several antidepressant medicines that she has been on and she says they do not particularly help.  She works with a Therapist, sports and a Warden/ranger.  Cholecystectomy 1996,  hysterectomy 1997.  MEDICATIONS: 1. Xanax 1-1.5 mg h.s. p.r.n. 2. Lorcet b.i.d.-t.i.d. p.r.n.  ALLERGIES:  None.  FAMILY HISTORY:  Her father is 79 years of age, has bad circulation and may require a leg amputation.  He also has HTN.  Her mother died at age 78.  SOCIAL HISTORY:  She has lived most of her life in [place].  She is presently separated.  She has four children at home.  She is on SSI.  She last worked about 10 years ago.  She completed 11th grade.  She never smoked and does not drink alcohol.  PHYSICAL EXAMINATION  VITAL SIGNS:  Weight 199 pounds, height 5 feet 10 inches, blood pressure 120/80, respirations 16.  GENERAL:  She appears tired and low spirited.  SKIN:  Clear.  HEENT:  Normal hair pattern.  PERRL/EOMI.  Mouth:  Clear.  NECK:  Negative JVD.  LUNGS:  Clear.  HEART:  Regular, no murmur.  ABDOMEN:  Soft, nontender.  Negative HSM.  MUSCULOSKELETAL:  Hands, wrists, elbows, shoulders, neck:  Good range of motion and no synovitis.  Neck was moderately stiff.  Trigger points were in the shoulder, neck occiput, anterior chest, and upper paraspinous muscles were reported as tender and hurting but there was no wincing.  The back was more tender and  there was wincing.  Hips, knees, ankles, feet had a painless full range of motion and showed no synovitis.  NEUROLOGIC:  Strength 5/5.  DTRs are 2+ throughout.  Negative SLRs.  ASSESSMENT AND PLAN: 1. Back pain.  Although she has the reported bulging disk by MRI, I do not    believe that she is really showing any radiculopathy.  She has had a long    history of back pain.  She has been through physical therapy.  Some of the    usual modalities such as walking and swimming certainly could be useful to    her.  These were recommended. 2. Depression.  I believe she may be better characterized as having dysthymia.    She also has the insomnia.  She is still awakening unrested.  She will keep    her Xanax at 1 mg and  I have added a small amount of Flexeril which she    might increase to 10-20 mg h.s.  Spent time explaining how improved sleep    can reduce some of this overall aching. 3. Hysterectomy. 4. Irritable bowel syndrome.  Ed, we will try the above approach.  I believe one place to help is to improve her sleep.  She is on adequate amount of pain medicine.  I do not see signs and symptoms to suggest an inflammatory arthritis.  I will check a CBC, CMET, and TSH today.  She will return in two months. Dictated by:   Aundra Dubin, M.D. Attending Physician:  Aundra Dubin DD:  06/25/01 TD:  06/26/01 Job: [id number]

## 2011-02-01 NOTE — Group Therapy Note (Signed)
Colleen Sellers, Colleen Sellers                 ACCOUNT NO.:  [id number]   MEDICAL RECORD NO.:  [id number]          PATIENT TYPE:  INP   LOCATION:  A218                          FACILITY:  [facility]   PHYSICIAN:  Edward L. Juanetta Gosling, M.D.DATE OF BIRTH:  04-14-1966   DATE OF PROCEDURE:  02/10/2006  DATE OF DISCHARGE:                                   PROGRESS NOTE   PROBLEM:  Possible seizure disorder.   HISTORY:  Colleen Sellers had surgery with an ethmoidectomy several days ago and  was put on Ultram.  Ultram has been associated with seizures and she did  have a seizure.  She has not had any since she has been here.  This morning,  she says she feels okay and wants to go home.  Dr. Gerilyn Pilgrim, the  neurologist, is not available today.   OBJECTIVE:  VITAL SIGNS:  Her physical exam shows her temperature is 97.4,  pulse 61, respirations 20, blood pressure 115/70.  O2 SAT is 96% on room  air.  Weight 173.8.  Her face looks okay.  Her chest is clear.  She has been  up and moving around this morning and her CT was negative.   ASSESSMENT:  I think she probably can go home for outpatient followup with  instructions to return immediately if she has problems.  I will discuss this  with her in a moment and see what she wants to do.      Edward L. Juanetta Gosling, M.D.  Electronically Signed     ELH/MEDQ  D:  02/10/2006  T:  02/10/2006  Job:  [id number]

## 2011-02-01 NOTE — Op Note (Signed)
NAME:  Colleen Sellers, Colleen Sellers                          ACCOUNT NO.:  [id number]   MEDICAL RECORD NO.:  [id number]                   PATIENT TYPE:  AMB   LOCATION:  DAY                                  FACILITY:  [facility]   PHYSICIAN:  Lionel December, M.D.                 DATE OF BIRTH:  1965/09/19   DATE OF PROCEDURE:  12/12/2003  DATE OF DISCHARGE:                                 OPERATIVE REPORT   PROCEDURE:  Flexible sigmoidoscopy.   ENDOSCOPIST:  Lionel December, M.D.   INDICATIONS:  This patient is a 45 year old Caucasian female who was  admitted about 4 weeks ago to this facility for nausea, vomiting and  diarrhea which subsequently turned bloody.  Stool studies were negative.  Sigmoidoscopy was offered at that time, but the patient wanted to wait. She  has not responded to antispasmodic therapy and is still having diarrhea and  intermittent bleeding. Since she had a colonoscopy in November 2001 we  decided to do just a sigmoidoscopy and she is agreeable.   FINDINGS:  Procedure performed in endoscopy suite.  The patient's vital  signs and O2 saturation were monitored during the procedure.  The patient  was placed in the left lateral recumbent position and rectal examination  performed.  She complained of some anal discomfort and Xylocaine jelly was  applied to her anal canal.  A digital exam was normal.   Olympus videoscope was placed in the rectum where the mucosa was noted to be  normal.  It was advanced in sigmoid colon to 40 cm and mucosa was entirely  normal.  Pictures were taken for the record.  The rectal mucosa was  reexamined on the way out and there were no mucosal abnormalities.   The scope was retroflexed to examine the anorectal junction and small  hemorrhoids were noted below the dentate line.  The endoscope was  straightened and withdrawn.  The patient tolerated the procedure well.   FINAL DIAGNOSES:  1. Small external hemorrhoids, otherwise normal sigmoidoscopy.  2. I  suspect her diarrhea is do to irritable bowel syndrome, post infection,     and rectal bleeding secondary to hemorrhoids.   RECOMMENDATIONS:  1. We will stop her levosin and try her on dicyclomine 10-20 mg before each     meal.  2. She will keep a stool diary as to the frequency and consistency of her     stools, as well as record of bleeding episodes and will return for OV in     4-6 weeks.      ___________________________________________                                            Lionel December, M.D.   NR/MEDQ  D:  12/12/2003  T:  12/12/2003  Job:  [id number]   cc:   Ramon Dredge L. Juanetta Gosling, M.D.  [address]  [place]  [place] [address]  Fax: [phone]

## 2011-02-01 NOTE — H&P (Signed)
NAME:  Colleen Sellers, Colleen Sellers NO.:  [id number]   MEDICAL RECORD NO.:  [id number]                   PATIENT TYPE:  EMS   LOCATION:  ED                                   FACILITY:  [facility]   PHYSICIAN:  Vania Rea, M.D.              DATE OF BIRTH:  07-May-1966   DATE OF ADMISSION:  11/15/2003  DATE OF DISCHARGE:                                HISTORY & PHYSICAL   PRIMARY CARE PHYSICIAN:  Dr. Juanetta Gosling.   CHIEF COMPLAINT:  Vomiting and bloody diarrhea since yesterday.   HISTORY OF PRESENT ILLNESS:  This is a 45 year old Caucasian lady with a  history of irritable bowel syndrome, fibromyalgia and depression who was in  her baseline state of healthuntil yesterday when she started having fevers  on and off, and episodic vomiting.  The patient vomited twice.  The first  time, it contained undigested food.  Both times, contained bilious material,  no blood.  Also started to have diarrhea yesterday multiple times, and in  the evening, there was some blood mixed with loose stool.  Today, so far,  she has had about five loose stools, sometimes blood mixed in with the  stool.  Sometimes she says it looks like frank pots of blood.  Diarrhea is  associated with abdominal cramps, episodic chest pains and episodic  shortness of breath.  The patient has no history of fibroids.  The patient  had a colonoscopy two years ago, and according to the patient, polyps were  found.  The patient has been having dizziness but no associated syncope.   The patient denies headache, sinusitis or post nasal drip.  Denies any  episodes of weight loss. Denies nausea or constipation.  Denies frequency,  dysuria o hematuria.  Denies orthopnea and PND, but has been having episodic  lower extremity edema.   The patient lives with four children.  No one else in the household is  having vomiting, diarrhea, or viral illness.  The patient has no history of  recent antibiotic use.   PAST MEDICAL  HISTORY:  Nasal allergies, depression for many years, irritable  bowel syndrome, chronic lower-extremity pain.  Numbness of both hands for  the past two weeks, fibromyalgia.  Status post cholecystectomy in 1996,  status post hysterectomy in 1997.   MEDICATIONS:  1. Clarinex one tablet daily.  2. Wellbutrin 300 mg daily.  3. Xanax 0.5 mg p.r.n.  4. Lorcet 10/650 p.r.n. for pain.   ALLERGIES:  1. CODEINE AND SULFATE causes nausea.  2. MORPHINE caused itching.  3. DILAUDID causes a feeling of heat all over.   SOCIAL HISTORY/FAMILY HISTORY:  The patient is a divorced mother of five.  She suffers from chronic depression.  She has a history of her mother  comitting suicide at age 46 when the patient was 45 years old.  The patient  has been depressed ever since.  She  has not worked outside of her home for  the past 13 years.  She has never smoked.  She does not drink alcohol.  She  has never used illicit drugs.  Her father is 34 years old and has  atherosclerosis and hypertension.   REVIEW OF SYSTEMS:  No further contributory.   PHYSICAL EXAMINATION:  GENERAL:  This is an obese, young Caucasian lady,  lying in bed, in no obvious distress.  VITAL SIGNS:  Temperature 98.2, blood pressure 128/70, pulse 61,  respirations 20.  HEENT:  She is pink and anicteric.  Mucous membranes are moist.  She has  received IV fluids, Zofran and morphine in the emergency room.  There is no  jugular venous distension.  No lymphadenopathy.  CHEST:  Clear to auscultation bilaterally.  CARDIOVASCULAR:  Regular rhythm, no murmurs.  ABDOMEN:  Obese, soft with marked epigastric tenderness.  She has normal  abdominal bowel sounds.  RECTAL:  Per the emergency room physician, revealed some blood PR.  EXTREMITIES:  She has 1+ edema bilaterally.  CNS:  Depressed affect, but alert and oriented x3, no focal deficit.   LABORATORY DATA:  White count 12,000, hematocrit 38.8, MCV 91.7, RDW 11.7.  Absolute granulocyte  count is elevated at 9.2 thousand.  Liver function  tests are completely normal.  Lipase is 30.  Amylase is not done.  Serum  chemistry is also normal with a sodium of 135, potassium 3.6, chloride 103.  CO2 28.  BUN 7.  Creatinine 0.9.  Calcium 8.7.  Urinalysis:  Specific  gravity 1.010, and completely bland.  Abdominal series was done which was  negative for any acute abnormality.   ASSESSMENT:  This is a young Caucasian lady with a history of irritable  bowel syndrome and fibromyalgia who presents with a history of two days of  vomiting and bloody diarrhea, who is found to have epigastric and blood per  rectum, but whose urinalysis reveals no evidence of dehydration, whose serum  chemistry is so far normal, but who does have a mild leukocytosis.   PLAN:  1. We will admit her, continue hydration, work her up for an infectious     diarrhea.  2. We will have the gastroenterologist see her in view of her history of     polyps, and they may consider doing a repeat colonoscopy.     ___________________________________________                                         Vania Rea, M.D.   LC/MEDQ  D:  11/15/2003  T:  11/15/2003  Job:  [id number]
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
## 2011-02-01 NOTE — Discharge Summary (Signed)
NAMELONDIN, ANTONE                 ACCOUNT NO.:  [id number]   MEDICAL RECORD NO.:  [id number]          PATIENT TYPE:  INP   LOCATION:  A218                          FACILITY:  [facility]   PHYSICIAN:  Edward L. Juanetta Gosling, M.D.DATE OF BIRTH:  07/12/1966   DATE OF ADMISSION:  02/09/2006  DATE OF DISCHARGE:  05/28/2007LH                                 DISCHARGE SUMMARY   FINAL DISCHARGE DIAGNOSES:  1.  Possible seizure disorder.  2.  Status post recent ethmoidectomy.  3.  Chronic low back pain.  4.  Fibromyalgia.   HISTORY OF PRESENT ILLNESS:  This is a 45 year old who was brought to the  emergency room because of confusion.  She had had a recent ethmoidectomy,  and she was placed on a number of medications including Ultram.  She had  been taking her medications as prescribed when she developed confusion and  may have had a seizure.  The seizure history is not clear.   PHYSICAL EXAMINATION ON ADMISSION:  GENERAL:  She was awake but somewhat  lethargic.  The rest of her exam per the previous history and physical.   LABORATORY DATA:  Lab work also per the history and physical.   HOSPITAL COURSE:  She was brought in and had a CT of the brain, which was  normal, routine lab tests which were normal, and had no further episodes.  Over the next 24 hours, she became very alert, oriented, had no further  seizure activity, and she is discharged with the thought that this may have  been related to Ultracet.  She is going to have outpatient neurological  evaluation and was told to return here immediately if she has further  episodes.  She is going to stop the Ultram.      Edward L. Juanetta Gosling, M.D.  Electronically Signed     ELH/MEDQ  D:  02/10/2006  T:  02/10/2006  Job:  [id number]

## 2011-02-01 NOTE — Discharge Summary (Signed)
NAME:  Colleen Sellers, Colleen Sellers                          ACCOUNT NO.:  [id number]   MEDICAL RECORD NO.:  [id number]                   PATIENT TYPE:  INP   LOCATION:  A319                                 FACILITY:  [facility]   PHYSICIAN:  Vania Rea, M.D.              DATE OF BIRTH:  20-Jul-1966   DATE OF ADMISSION:  11/15/2003  DATE OF DISCHARGE:  11/18/2003                                 DISCHARGE SUMMARY   PRIMARY CARE PHYSICIAN:  Dr. Kari Baars.   DISCHARGE DIAGNOSES:  1. Inflammatory diarrhea, resolved.  2. Lower gastrointestinal bleed, resolved.  3. History of fibromyalgia, stable.  4. History of irritable bowel syndrome.  5. History of depression.  6. History of nasal allergies.  7. History of asthma.  8. History of migraine.   DISCHARGE CONDITION:  Stable.   DISPOSITION:  Discharged to home.   HOSPITAL COURSE:  Please refer to history and physical of November 15, 2003.  This is a 45 year old Caucasian lady with a history of irritable bowel  syndrome, fibromyalgia, depression, who was at her baseline until she  started having nausea, vomiting, and diarrhea for 2 days.  On the evening of  the first day the diarrhea started to become bloody, and she was passing by  the next day loose stool mixed with blood as well as frank blood.  The  patient had a past history of cholecystectomy.  The patient was admitted,  kept initially on clear liquids, IV fluid rehydration, and GI consult  sought.  It was the opinion of the GI specialist that this lady had an  inflammatory diarrhea.  She had a history of a colonoscopy about 2 years ago  by Dr. Roetta Sessions which was reported as negative.  With simple  symptomatic treatments the patient's symptoms started to resolve.  She  received no antibiotics for most of her care.  Her C. difficile was  negative, and cultures of the stool so far are negative.  Her last stool  Hemoccult was negative.  The patient was started on Cipro this morning, but  it has been reconsidered; she will not be discharged on Cipro.  We will  await the results of her stool cultures.  The patient is now tolerating a  normal diet without any ill effect.   The patient developed an attack of wheezing this morning and was given  nebulizations.  The patient uses albuterol nebulizer at home.   FOLLOW-UP:  1. Follow up with Dr. Kari Baars.  2. Follow up with Dr. Ok Anis Rehman/Dr. Roetta Sessions for possible     sigmoidoscopy in a few weeks.   SPECIAL INSTRUCTIONS:  Just continue the usual hygienic precautions; to wash  hands before meals and after using the bathroom, and avoid foods which upset  the stomach.  She is to return to the emergency room or call her doctor for  any further episodes of passing blood per rectum.  ___________________________________________                                         Vania Rea, M.D.   LC/MEDQ  D:  11/18/2003  T:  11/19/2003  Job:  [id number]

## 2011-02-01 NOTE — Consult Note (Signed)
NAME:  Colleen Sellers, Colleen Sellers                          ACCOUNT NO.:  [id number]   MEDICAL RECORD NO.:  [id number]                   PATIENT TYPE:  INP   LOCATION:  A319                                 FACILITY:  [facility]   PHYSICIAN:  Lionel December, M.D.                 DATE OF BIRTH:  03-10-1966   DATE OF CONSULTATION:  11/16/2003  DATE OF DISCHARGE:                                   CONSULTATION   REASON FOR CONSULTATION:  Bloody diarrhea.   HISTORY OF PRESENT ILLNESS:  Colleen Sellers is a 45 year old Caucasian female who was  admitted to Dr. Blair Dolphin service yesterday for evaluation and treatment of  acute nausea, vomiting, abdominal pain, and diarrhea.   The patient was in her usual state of health until she woke up 1 day prior  to admission.  She had nausea, vomiting, cramps, and began to have loose,  watery stools.  After having five or six stools she began to pass fresh  blood with her bowel movements.  She felt very bad.  She also felt febrile  although she did not check her temperature.  She came to the emergency room  yesterday.  She was evaluated and hospitalized for further management.  She  has been treated with IV fluids and she has been tolerating clear liquids.  She had five stools yesterday and noted blood in all of them but today thus  far she has had one liquid stool but this did not have any obvious or frank  blood in it.  She feels some better.  She notices rawness in her lower  abdomen.  She has not had any nausea or vomiting.  She still does not feel  well and has a headache.  There is no recent history of antibiotic use.   She is on:  1. Wellbutrin 300 mg daily.  2. Claritin 10 mg daily.  3. Xanax 0.5 mg t.i.d. p.r.n.  4. Hydrocodone/APAP 10/650 q.4h. p.r.n.  5. Promethazine 12.5 mg IV q.4h. p.r.n.   PAST MEDICAL HISTORY:  History of irritable bowel syndrome which lately has  not bothered her.  She generally has post prandial cramps and diarrhea.  She  has been evaluated  by Dr. Jena Gauss in the past.  She had colonoscopy in  November 2001.  At that time she was also having hematochezia.  She had a  normal colonoscopy and a terminal ileoscopy.  She has history of depression,  fibromyalgia.  She has had tubal ligation, hysterectomy, and  cholecystectomy.   ALLERGIES:  CODEINE causing nausea.  Dr. Blair Dolphin note also mentions  SULFA, MORPHINE, and DILAUDID although the patient did not tell me or forgot  about it.   FAMILY HISTORY:  Mother died at age 73.  She committed suicide.  Father has  arthrosclerosis and is not doing well.  She has two brothers and one sister  in good health.   SOCIAL HISTORY:  She is divorced.  She is presently not working.  She has  four children.  She does not smoke cigarettes or drink alcohol.   PHYSICAL EXAMINATION:  GENERAL:  Well-developed, well-nourished Caucasian  female who does not appear to be in any distress.  VITAL SIGNS:  She weighs 187.4 pounds, she is 5 feet 9.5 inches tall.  Pulse  is 57 per minute, blood pressure 100/54, respiratory rate is 20, and  temperature is 97.6.  HEENT:  Conjunctivae are pink, sclerae nonicteric.  Oropharyngeal mucosa is  normal.  NECK:  Without masses or thyromegaly.  Neck is supple.  CARDIAC:  With regular rhythm, normal S1 and S2.  No murmur or gallop noted.  LUNGS:  Clear to auscultation.  ABDOMEN:  Symmetrical, bowel sounds are normal.  Palpation reveals soft  abdomen.  She has mild generalized tenderness without any guarding or  rebound.  No organomegaly or masses noted.  RECTAL:  Deferred.  She does not have clubbing, peripheral edema, or skin  rash.   LABORATORY DATA:  From admission:  WBC 7.0, H&H is 12 and 35.0, platelet  count is 276.  Diff pertinent for 1% eosinophils.  Sodium 137, potassium  3.8, chloride 105, CO2 is 31, glucose 109, BUN 5, creatinine 1.0, calcium  8.2.  Her H&H from this morning is 11.9 and 34.7.  Stool C. difficile toxin  titer and culture is pending.    ASSESSMENT:  Colleen Sellers is a 45 year old Caucasian female who presents with acute  illness starting with nausea, vomiting, abdominal cramps, and nonbloody  diarrhea which subsequently turned bloody.  She is improving with  symptomatic therapy.  She has remained afebrile in the hospital.  There is  no history of prior antibiotic use but she just came [place] from the beach  recently.   I suspect we are dealing with infectious enterocolitis although inflammatory  bowel disease can present in a similar fashion.  As long as she is improving  with supportive therapy we would hold off any endoscopic evaluation.  However, if her symptoms aggress would offer a flexible sigmoidoscopy.   RECOMMENDATIONS:  1. As discussed with Dr. Orvan Falconer, the Reglan will be discontinued as it may     induce cramps or make her diarrhea worse.  2. Levsin/SL one q.i.d. p.r.n.  3. Diet will be advanced to full liquids.  4. Also obtain stool for O&P.  5. She will have CBC and LFTs in a.m.  6. The patient will be reevaluated in a.m. and determination made whether or     not she should undergo a flexible sigmoidoscopy.  However, if stool     culture reveals Salmonella or other pathogens she will not need any more     studies.   I would like to thank Dr. Orvan Falconer for the opportunity to participate in the  care of this nice lady.      ___________________________________________                                            Lionel December, M.D.   NR/MEDQ  D:  11/16/2003  T:  11/16/2003  Job:  [id number]

## 2011-02-01 NOTE — Op Note (Signed)
Colleen Sellers, MIRABELLA                 ACCOUNT NO.:  [id number]   MEDICAL RECORD NO.:  [id number]          PATIENT TYPE:  AMB   LOCATION:  DAY                           FACILITY:  [facility]   PHYSICIAN:  Tilda Burrow, M.D. DATE OF BIRTH:  October 08, 1965   DATE OF PROCEDURE:  07/31/2006  DATE OF DISCHARGE:                                 OPERATIVE REPORT   PREOPERATIVE DIAGNOSIS:  Rectocele, external hemorrhoid, painful ovarian  cyst.   POSTOPERATIVE DIAGNOSIS:  Endometriosis, extensive pelvic adhesions, status  post vaginal hysterectomy, rectocele, external hemorrhoid.   PROCEDURE:  Laparoscopic bilateral salpingo-oophorectomy, excision of  endometriosis implant left uterosacral ligament, posterior repair, and  external hemorrhoidectomy.   SURGEON:  Tilda Burrow, M.D.   ASSISTANT:  Marlinda Mike, RN and [place], [place]   ANESTHESIA:  General.   COMPLICATIONS:  None.   FINDINGS:  Endometriosis densely attached to the left uterosacral ligament,  bilateral ovaries attached to the vaginal cuff.  External hemorrhoid and  rectocele.   DESCRIPTION OF PROCEDURE:  The patient was taken to the operating room and  prepped for combined abdominal and vaginal procedure.  Foley catheter was in  place.  The abdominal portion of the case was performed first.  Infraumbilical vertical 1 cm skin incision was made as well as transverse  suprapubic incision of 1 cm length.  The Veress needle was introduced  through the umbilicus being careful to orient it to the pelvis while  elevating the abdominal wall.  Pneumonia was achieved, but there was some  preperitoneal insufflation of CO2 identified when we placed the laparoscopic  trocar.  The abdominal contents were inspected and there was no suspicion of  trauma to the internal organs.  There was no bleeding.  The pelvis was  inspected and photos #1 and 2 show the adnexal structures as identified.  The infundibulopelvic ligaments on both sides were very elongate,  relaxed,  and the tubes and ovaries were densely adherent to the pelvic cuff.  There  was endometriosis on the left uterosacral ligaments is identified in photo  #4 and epiploic fat attachments to the pelvic structures is identified in  photo #3.  Attention was then directed to resection.  Photo #5 shows the  right tube and ovary on traction and counter traction and the ureters  clearly visible out of the surgical field.  We transected the right  infundibulopelvic ligament and left infundibulopelvic ligament staying close  to the tube and ovary and gradually peeling them off the pelvic sidewall.  Careful attention was used throughout the case to maintain appreciation of  where the ureters traversed the pelvic side wall and then dived into the  pelvic floor.  Ureters were considered safe throughout the procedure.  The  epiploic fat appendages were released from the tube and ovary and then the  specimens removed as seen in photo #6 and 7.  Photo #6 shows the ureter  coursing clearly along the side wall and shows the pelvis after resection of  the dense endometriosis implant on the left uterosacral ligament.   The uterosacral ligament excision was  performed by identifying the ureter  going posteriorly 0.5 cm, opening the retroperitoneum, and then carefully  displacing the ureter and retroperitoneal structures laterally while placing  the endometrial implant on counter traction and using Harmonic scalpel to  carefully peel the specimen off from the underlying fatty tissue.  A  specimen approximately 1 cm x 8 mm x 8 mm was removed.  Improved mobility of  the pelvic cuff was achieved by this process.  Irrigation of the pelvis was  performed and hemostasis confirmed.  Photo #8 shows some phsyiologic  attachments to the right ileocecal junction.  This was released partially in  case this was the source of discomfort.  Irrigation again confirmed good  hemostasis and 200 mL approximately was left in  the pelvis and laparoscopic  instruments removed.  The specimens had been removed earlier with an  EndoCatch bag.  Fascial closure of the 10-12 mm trocars was performed easily  and the right lower quadrant 5 mm port did not require fascial closure.  Subcuticular 3-0 Dexon closure of the skin was followed by placement of  Dermabond over the skin of the abdominal incisions.   Posterior repair.  The legs were placed in the high lithotomy position using  the adjustable leg supports and the vaginal portions of the procedure  performed.  The hymen remnants were grasped with Allis clamps.  The  posterior vaginal tissues infiltrated with Marcaine with epinephrine, split  in the midline halfway up the vagina.  The vaginal mucosa resected off of  the underlying connective tissue.  The digital rectal examination was placed  beneath the vaginal bib into the rectum and using this to maintain  orientation to the rectum, we were able to identify pararectal supportive  tissues on either side, pulling the levator plate together in such a way  with vertical mattress sutures to result in improved posterior perineal  support and reduction of anterior flexion of the rectosigmoid.   Vaginal mucosa was trimmed and pulled together with interrupted 2-0 chromic  suture.   Hemorrhoidectomy was then performed by grasping the prolapsed redundant  rectal hemorrhoid with hemostats, excising the excess tissue, and placing  three interrupted 2-0 chromic sutures to reapproximate the skin edges and  then Bovie cautery used as necessary to control some oozing.  Marcaine with  epinephrine was infiltrated on the right side to improve pain control  initially postoperatively.  The patient went to the recovery room in good  condition.  Needle, sponge, and instrument counts correct.      Tilda Burrow, M.D.  Electronically Signed     JVF/MEDQ  D:  07/31/2006  T:  07/31/2006  Job:  [id number]

## 2011-02-01 NOTE — H&P (Signed)
NAME:  Colleen Sellers, Colleen Sellers NO.:  [id number]   MEDICAL RECORD NO.:  [id number]                   PATIENT TYPE:  EMS   LOCATION:  ED                                   FACILITY:  [facility]   PHYSICIAN:  Vania Rea, M.D.              DATE OF BIRTH:  02/18/1966   DATE OF ADMISSION:  11/15/2003  DATE OF DISCHARGE:                                HISTORY & PHYSICAL   PRIMARY CARE PHYSICIAN:  Dr. Juanetta Gosling.   CHIEF COMPLAINT:  Vomiting and bloody diarrhea since yesterday.   HISTORY OF PRESENT ILLNESS:  This is a 45 year old Caucasian lady with a  history of irritable bowel syndrome, fibromyalgia and depression who was in  her baseline state of health until yesterday when she started having fevers  on and off, and episodic vomiting.  The patient vomited twice.  The first  time, it contained undigested food.  Both times, contained bilious material,  no blood.  Also started to have diarrhea yesterday multiple times, and in  the evening, there was some blood mixed with loose stool.  Today, so far,  she has had about five loose stools, sometimes blood mixed in with the  stool.  Sometimes she says it looks like frank pots of  blood.  Diarrhea is  associated with abdominal cramps, episodic chest pains and episodic  shortness of breath.  The patient has no history of fibroids.  The patient  had a colonoscopy two years ago, and according to the patient, polyps were  found.  The patient has been having dizziness but no associated syncope.   The patient denies headache, sinusitis or post nasal drip.  Denies any  episodes of weight loss.  Denies nausea or constipation.  Denies frequency,  dysuria or hematuria.  Denies orthopnea and PND, but has been having  episodic lower-extremity edema.   DICTATION ENDED AT THIS POINT.     ___________________________________________                                         Vania Rea, M.D.   LC/MEDQ  D:  11/15/2003  T:  11/15/2003   Job:  [id number]

## 2011-02-01 NOTE — H&P (Signed)
Colleen Sellers, Colleen Sellers                 ACCOUNT NO.:  [id number]   MEDICAL RECORD NO.:  [id number]          PATIENT TYPE:  INP   LOCATION:  A218                          FACILITY:  [facility]   PHYSICIAN:  Tesfaye D. Felecia Shelling, MD   DATE OF BIRTH:  01/04/1966   DATE OF ADMISSION:  02/09/2006  DATE OF DISCHARGE:  LH                                HISTORY & PHYSICAL   CHIEF COMPLAINT:  Confusion and change in mental status.   HISTORY OF PRESENT ILLNESS:  This is a 45 years old female patient with  history of multiple medical illnesses including anxiety, depression and  fibromyalgia who was brought to the emergency room with the above  complaints.  The patient recently under a surgery for allergic rhinitis.  She was given multiple pain medications.  The patient claimed she took her  medicine according to her prescription.  However, the patient had an episode  of shaking-like seizure, then she was confused and became lethargic.  The  patient was then brought to the emergency room where she was evaluated.  Her  lab tests and a CT scan of the head was negative.  The patient was admitted  under telemetry for further evaluation.   REVIEW OF SYSTEMS:  The patient is somewhat confused and lethargic.  Unable  to give a detailed history.  However, according to her daughter, who lives  with the patient, the patient had no complaint of chest pain, nausea,  vomiting or calf fever or chills.   PAST MEDICAL HISTORY:  1.  Inflammatory diarrhea.  2.  History of lower GI bleed.  3.  Fibromyalgia.  4.  Irritable bowel syndrome.  5.  Depression disorder.  6.  Nasal allergies.  7.  Migraine headaches.   CURRENT MEDICATIONS:  1.  Darvocet-N 100 as needed.  2.  Hydrocodone/acetaminophen 10/500 1 tablet as needed.  3.  Ultracet 50 mg as needed.  4.  Omnicef 300 mg daily.  5.  Xanax as needed.   SOCIAL HISTORY:  The patient lives with her daughter.  No history of  alcohol, tobacco or substance abuse.   PHYSICAL EXAMINATION:  The patient is arousable but somewhat lethargic and  confused with vitals blood pressure 138/80, pulse 70, respiratory rate 18,  temperature 97.9 degree Fahrenheit.  HEENT pupils are equal and reactive.  Neck is supple.  Chest clear lung field.  Good air entry.  Cardiovascular  system secondary sound heard.  No murmur, no gallop.  Abdomen is soft and  relaxed.  Bowel sounds are present.  No masses or organomegaly.  Extremities  no leg edema.   LABS:  WBC 9.7, hemoglobin 13.2, hematocrit 40.6, platelets 331,000.  Sodium  140, potassium 3.7, chloride 102, carbon dioxide 31, glucose 96, BUN 12,  creatinine 1.0.   ASSESSMENT:  This is a 45 year old female patient, with a history of  multiple medical illnesses, who recently underwent surgery on her nose.  She  came with complaint of seizure-like activity and confusion.  The patient has  been taking multiple narcotic medication including Darvocet, hydrocodone and  Ultracet.  Her CT scan of the head and lab tests are within normal limits.  Her symptoms could be due to a combination of narcotic medications.   PLAN:  We will admit the patient under telemetry.  We will do neurologic  checks q.2h.  We will hold all narcotic medications.  We will continue the  patient on IV fluid and we will continue to monitor any seizure activity.      Tesfaye D. Felecia Shelling, MD  Electronically Signed     TDF/MEDQ  D:  02/09/2006  T:  02/09/2006  Job:  [id number]

## 2011-07-01 ENCOUNTER — Encounter: Payer: Self-pay | Admitting: Cardiology

## 2011-07-02 ENCOUNTER — Encounter: Payer: Self-pay | Admitting: *Deleted

## 2011-07-02 ENCOUNTER — Encounter: Payer: Self-pay | Admitting: Cardiology

## 2011-07-02 ENCOUNTER — Ambulatory Visit (INDEPENDENT_AMBULATORY_CARE_PROVIDER_SITE_OTHER): Payer: Medicaid - Out of State | Admitting: Cardiology

## 2011-07-02 DIAGNOSIS — M549 Dorsalgia, unspecified: Secondary | ICD-10-CM

## 2011-07-02 DIAGNOSIS — K589 Irritable bowel syndrome without diarrhea: Secondary | ICD-10-CM

## 2011-07-02 DIAGNOSIS — M797 Fibromyalgia: Secondary | ICD-10-CM | POA: Insufficient documentation

## 2011-07-02 DIAGNOSIS — E669 Obesity, unspecified: Secondary | ICD-10-CM

## 2011-07-02 DIAGNOSIS — G40909 Epilepsy, unspecified, not intractable, without status epilepticus: Secondary | ICD-10-CM

## 2011-07-02 DIAGNOSIS — G8929 Other chronic pain: Secondary | ICD-10-CM | POA: Insufficient documentation

## 2011-07-02 DIAGNOSIS — Q676 Pectus excavatum: Secondary | ICD-10-CM

## 2011-07-02 DIAGNOSIS — R079 Chest pain, unspecified: Secondary | ICD-10-CM | POA: Insufficient documentation

## 2011-07-02 DIAGNOSIS — F329 Major depressive disorder, single episode, unspecified: Secondary | ICD-10-CM

## 2011-07-02 NOTE — Patient Instructions (Signed)
Your physician recommends that you schedule a follow-up appointment in: 2 months   Your physician has requested that you have a stress echocardiogram. For further information please visit [email or web address] Please follow instruction sheet as given.

## 2011-07-02 NOTE — Assessment & Plan Note (Addendum)
Patient has a minor pectus deformity.  Chest x-ray reviewed, which demonstrates a mild thoracic scoliosis.  While these abnormalities may be contributing to impairment of respiratory dynamics, I doubt they are severe enough to create symptoms.

## 2011-07-02 NOTE — Progress Notes (Signed)
HPI: Ms. Dornbush is seen at the kind request of Dr. Juanetta Gosling for evaluation of chest discomfort associated with dyspnea and diaphoresis.  This nice young woman is disabled as a result of fibromyalgia and arthritis.  She has no known cardiac disease, but has previously been evaluated by Dr. Earna Coder, perhaps 4 years ago.  Records concerning that assessment have been requested.  A Holter monitor interpreted by Dr. Dorethea Clan 7 years ago was normal.  She has also undergone pulmonary function testing with an ENT physician who was previously following her.  Those records as well as the results of recent laboratory studies have also been requested.    She presents with fairly long-standing symptoms, stretching back for at least a number of years.  She experiences nonexertional chest heaviness associated with dyspnea.  When discomfort is severe, she also notes diaphoresis.  There is intermittent radiation to the left shoulder.  There is no relationship to exertion, which typically is modest due to easy fatigability.  She denies pedal edema, orthopnea, PND, or palpitations.  Current Outpatient Prescriptions  Medication Sig Dispense Refill  . ALPRAZolam (XANAX) 1 MG tablet Take 1 mg by mouth at bedtime as needed.        Marland Kitchen amphetamine-dextroamphetamine (ADDERALL, 20MG ,) 20 MG tablet Take 20 mg by mouth 2 (two) times daily.        . cetirizine (ZYRTEC) 10 MG tablet Take 10 mg by mouth daily.        . divalproex (DEPAKOTE) 500 MG DR tablet Take 100 mg by mouth at bedtime.        . furosemide (LASIX) 40 MG tablet Take 40 mg by mouth as needed.        . Oxycodone-Acetaminophen (PERCOCET PO) Take by mouth as needed.           Allergies  Allergen Reactions  . Codeine   . Morphine       Past Medical History  Diagnosis Date  . IBS (irritable bowel syndrome)     Mild hematochezia; negative for celiac disease; mild Schatzki's ring; small hiatal hernia; minor gastric erosions; few diverticula  . Seizure disorder   .  Depression   . Chronic back pain   . Chest pain     Dyspnea, diaphoresis  . Fibromyalgia   . Overweight     Negative sleep study-2010, but treated for narcolepsy  . Surgical menopause 2002    age 45  . Degenerative joint disease     Also scoliosis and pectus deformity     Past Surgical History  Procedure Date  . Tubal ligation   . Cholecystectomy   . Colonoscopy 2001, 2010    negative  . Vaginal hysterectomy 1996  . Salpingoophorectomy 2002    Bilateral     Family History  Problem Relation Age of Onset  . Depression Mother     suicide     History   Social History  . Marital Status: Divorced    Spouse Name: N/A    Number of Children: 4  . Years of Education: N/A   Occupational History  . Unemployed    Social History Main Topics  . Smoking status: Never Smoker   . Smokeless tobacco: Never Used  . Alcohol Use: No     minimal use  . Drug Use: No  . Sexually Active: Not on file   Other Topics Concern  . Not on file   Social History Narrative  . No narrative on file  ROS: Patient reports intermittent headache and dizziness.  She requires corrective lenses.  She has also experienced wheezing and cough in the past with a diagnosis of asthma.  These symptoms have not been problematic of late.  Appetite is poor, and she has lost significant weight from her high, which was in excess of 200 pounds.  She notes constipation and gastroesophageal reflux disease and has been evaluated by GI specialists in the past.  Her most recent colonoscopy revealed no significant pathology.  She experiences urinary frequency. There is a remote history of syncope.   She experiences daytime somnolence, frequently napping, despite treatment with amphetamine.  She intermittently notes abdominal pain associated with nausea and emesis.  All other systems reviewed and are negative.  PHYSICAL EXAM: BP 133/86  Pulse 61  Ht 5\' 7"  (1.702 m)  Wt 182 lb (82.555 kg)  BMI 28.51 kg/m2    General-Well-developed; no acute distress Body Habitus-overweight HEENT-Reserve/AT; PERRL; EOM intact; conjunctiva and lids nl Neck-No JVD; no carotid bruits Endocrine-No thyromegaly Thorax-minor pectus deformity; scoliosis not easily appreciated on examination. Lungs-Clear lung fields; resonant percussion; normal I-to-E ratio Cardiovascular- normal PMI; distant S1 and S2 Abdomen-BS normal; soft and non-tender without masses or organomegaly Musculoskeletal-No deformities, cyanosis or clubbing Neurologic-Nl cranial nerves; symmetric strength and tone Skin- Warm, no significant lesions Extremities-Nl distal pulses; no edema  EKG: Normal sinus rhythm; within normal limits; no change when compared to a previous tracing performed 03/16/10  ASSESSMENT AND PLAN:

## 2011-07-02 NOTE — Assessment & Plan Note (Signed)
Patient reports symptoms that are atypical, but not totally incompatible with a cardiac etiology.  We will proceed with a stress echocardiogram for further evaluation.  Recent laboratory studies will be reviewed to exclude other causes for her fatigue and exercise intolerance.

## 2011-07-09 ENCOUNTER — Ambulatory Visit (HOSPITAL_COMMUNITY)
Admission: RE | Admit: 2011-07-09 | Discharge: 2011-07-09 | Disposition: A | Discharge status: Home / Self Care | Payer: Medicaid - Out of State | Source: Ambulatory Visit | Attending: Cardiology | Admitting: Cardiology

## 2011-07-09 ENCOUNTER — Encounter (HOSPITAL_COMMUNITY): Payer: Self-pay | Admitting: Cardiology

## 2011-07-09 DIAGNOSIS — R0989 Other specified symptoms and signs involving the circulatory and respiratory systems: Secondary | ICD-10-CM | POA: Insufficient documentation

## 2011-07-09 DIAGNOSIS — R0609 Other forms of dyspnea: Secondary | ICD-10-CM | POA: Insufficient documentation

## 2011-07-09 DIAGNOSIS — R079 Chest pain, unspecified: Secondary | ICD-10-CM | POA: Insufficient documentation

## 2011-07-09 DIAGNOSIS — R072 Precordial pain: Secondary | ICD-10-CM

## 2011-07-09 NOTE — Progress Notes (Signed)
Stress Lab Nurses Notes - Suzann Lazaro 07/09/2011  Reason for doing test: Chest Pain and Dyspnea  Type of test: Stress Echo  Nurse performing test: Parke Poisson, RN  Nuclear Medicine Tech: Not Applicable  Echo Tech: Karrie Doffing  MD performing test: R. Dietrich Pates  Family MD: Juanetta Gosling  Test explained and consent signed: yes  IV started: No IV started  Symptoms: Dizziness and fatigue  Treatment/Intervention: None  Reason test stopped: dizziness and fatigue  After recovery IV was: none  Patient to return to Nuc. Med at : None  Patient discharged: Home  Patient's Condition upon discharge was: stable  Comments: During test peak BP 148/72 & HR 142.  Recovery BP 128/60 & HR 72.  Symptoms resolved in recovery.  Erskine Speed T

## 2011-07-09 NOTE — Progress Notes (Signed)
*  PRELIMINARY RESULTS* Echocardiogram 2D Echocardiogram has been performed.  Conrad [place] 07/09/2011, 10:47 AM

## 2011-08-13 ENCOUNTER — Ambulatory Visit: Payer: Medicaid - Out of State | Admitting: Gastroenterology

## 2011-08-13 ENCOUNTER — Telehealth: Payer: Self-pay | Admitting: Gastroenterology

## 2011-08-13 NOTE — Telephone Encounter (Signed)
Pt was a no show

## 2011-08-21 ENCOUNTER — Encounter: Payer: Self-pay | Admitting: Internal Medicine

## 2011-08-21 NOTE — Telephone Encounter (Signed)
Needs f/u appt 

## 2011-08-22 ENCOUNTER — Encounter: Payer: Self-pay | Admitting: Cardiology

## 2011-08-27 NOTE — Telephone Encounter (Signed)
Pt is aware of OV on 12/14 @ 10 with KJ

## 2011-08-28 ENCOUNTER — Encounter: Payer: Self-pay | Admitting: Internal Medicine

## 2011-08-30 ENCOUNTER — Ambulatory Visit: Payer: Medicaid - Out of State | Admitting: Urgent Care

## 2011-09-02 ENCOUNTER — Encounter: Payer: Self-pay | Admitting: Cardiology

## 2011-09-02 ENCOUNTER — Ambulatory Visit: Payer: Medicaid - Out of State | Admitting: Cardiology

## 2011-09-03 ENCOUNTER — Ambulatory Visit: Payer: Medicaid - Out of State | Admitting: Urgent Care

## 2011-09-03 ENCOUNTER — Telehealth: Payer: Self-pay | Admitting: Urgent Care

## 2011-09-03 NOTE — Telephone Encounter (Signed)
Pt was a no show

## 2011-09-03 NOTE — Telephone Encounter (Signed)
Patient has no showed twice

## 2011-09-11 ENCOUNTER — Encounter: Payer: Self-pay | Admitting: Cardiology

## 2011-10-09 ENCOUNTER — Encounter: Payer: Medicaid - Out of State | Admitting: Cardiology

## 2011-10-10 NOTE — Progress Notes (Signed)
This encounter was created in error - please disregard.

## 2011-10-16 ENCOUNTER — Ambulatory Visit: Payer: Medicaid - Out of State | Admitting: Cardiology

## 2011-10-25 ENCOUNTER — Ambulatory Visit: Payer: Medicaid - Out of State | Admitting: Cardiology

## 2011-11-01 ENCOUNTER — Other Ambulatory Visit (HOSPITAL_COMMUNITY): Payer: Self-pay | Admitting: Pulmonary Disease

## 2011-11-01 ENCOUNTER — Ambulatory Visit (HOSPITAL_COMMUNITY)
Admission: RE | Admit: 2011-11-01 | Discharge: 2011-11-01 | Disposition: A | Discharge status: Home / Self Care | Payer: Medicaid - Out of State | Source: Ambulatory Visit | Attending: Pulmonary Disease | Admitting: Pulmonary Disease

## 2011-11-01 DIAGNOSIS — M542 Cervicalgia: Secondary | ICD-10-CM

## 2011-11-01 DIAGNOSIS — M549 Dorsalgia, unspecified: Secondary | ICD-10-CM

## 2011-11-01 DIAGNOSIS — M545 Low back pain, unspecified: Secondary | ICD-10-CM | POA: Insufficient documentation

## 2011-11-08 ENCOUNTER — Telehealth: Payer: Self-pay | Admitting: Cardiology

## 2011-11-08 NOTE — Telephone Encounter (Signed)
PATIENT CANCELED OR NO SHOWED LAST THREE OR MORE APPOINTMENTS/TMJ

## 2012-09-21 ENCOUNTER — Ambulatory Visit (HOSPITAL_COMMUNITY)
Admission: RE | Admit: 2012-09-21 | Discharge: 2012-09-21 | Disposition: A | Discharge status: Home / Self Care | Payer: Medicaid - Out of State | Source: Ambulatory Visit | Attending: Pulmonary Disease | Admitting: Pulmonary Disease

## 2012-09-21 ENCOUNTER — Other Ambulatory Visit (HOSPITAL_COMMUNITY): Payer: Self-pay | Admitting: Pulmonary Disease

## 2012-09-21 DIAGNOSIS — M546 Pain in thoracic spine: Secondary | ICD-10-CM | POA: Insufficient documentation

## 2012-09-21 DIAGNOSIS — M549 Dorsalgia, unspecified: Secondary | ICD-10-CM

## 2012-09-21 DIAGNOSIS — M545 Low back pain, unspecified: Secondary | ICD-10-CM | POA: Insufficient documentation

## 2012-11-12 ENCOUNTER — Other Ambulatory Visit (HOSPITAL_COMMUNITY): Payer: Self-pay | Admitting: Pulmonary Disease

## 2012-11-12 ENCOUNTER — Ambulatory Visit (HOSPITAL_COMMUNITY)
Admission: RE | Admit: 2012-11-12 | Discharge: 2012-11-12 | Disposition: A | Discharge status: Home / Self Care | Payer: Medicaid - Out of State | Source: Ambulatory Visit | Attending: Pulmonary Disease | Admitting: Pulmonary Disease

## 2012-11-12 DIAGNOSIS — J3489 Other specified disorders of nose and nasal sinuses: Secondary | ICD-10-CM

## 2012-11-26 ENCOUNTER — Encounter: Payer: Self-pay | Admitting: *Deleted

## 2014-03-10 ENCOUNTER — Other Ambulatory Visit: Payer: Self-pay | Admitting: Obstetrics and Gynecology

## 2014-06-12 ENCOUNTER — Emergency Department (HOSPITAL_COMMUNITY): Payer: Medicaid Other

## 2014-06-12 ENCOUNTER — Emergency Department (HOSPITAL_COMMUNITY)
Admission: EM | Admit: 2014-06-12 | Discharge: 2014-06-13 | Disposition: A | Discharge status: Home / Self Care | Payer: Medicaid Other | Attending: Emergency Medicine | Admitting: Emergency Medicine

## 2014-06-12 ENCOUNTER — Encounter (HOSPITAL_COMMUNITY): Payer: Self-pay | Admitting: Emergency Medicine

## 2014-06-12 DIAGNOSIS — Z8742 Personal history of other diseases of the female genital tract: Secondary | ICD-10-CM | POA: Insufficient documentation

## 2014-06-12 DIAGNOSIS — Z79899 Other long term (current) drug therapy: Secondary | ICD-10-CM | POA: Insufficient documentation

## 2014-06-12 DIAGNOSIS — Z8719 Personal history of other diseases of the digestive system: Secondary | ICD-10-CM | POA: Diagnosis not present

## 2014-06-12 DIAGNOSIS — S335XXA Sprain of ligaments of lumbar spine, initial encounter: Secondary | ICD-10-CM | POA: Insufficient documentation

## 2014-06-12 DIAGNOSIS — W108XXA Fall (on) (from) other stairs and steps, initial encounter: Secondary | ICD-10-CM | POA: Diagnosis not present

## 2014-06-12 DIAGNOSIS — Y9389 Activity, other specified: Secondary | ICD-10-CM | POA: Insufficient documentation

## 2014-06-12 DIAGNOSIS — S139XXA Sprain of joints and ligaments of unspecified parts of neck, initial encounter: Secondary | ICD-10-CM | POA: Insufficient documentation

## 2014-06-12 DIAGNOSIS — F329 Major depressive disorder, single episode, unspecified: Secondary | ICD-10-CM | POA: Diagnosis not present

## 2014-06-12 DIAGNOSIS — F3289 Other specified depressive episodes: Secondary | ICD-10-CM | POA: Diagnosis not present

## 2014-06-12 DIAGNOSIS — I509 Heart failure, unspecified: Secondary | ICD-10-CM | POA: Diagnosis not present

## 2014-06-12 DIAGNOSIS — G40909 Epilepsy, unspecified, not intractable, without status epilepticus: Secondary | ICD-10-CM | POA: Insufficient documentation

## 2014-06-12 DIAGNOSIS — E663 Overweight: Secondary | ICD-10-CM | POA: Insufficient documentation

## 2014-06-12 DIAGNOSIS — G8929 Other chronic pain: Secondary | ICD-10-CM | POA: Insufficient documentation

## 2014-06-12 DIAGNOSIS — Y929 Unspecified place or not applicable: Secondary | ICD-10-CM | POA: Insufficient documentation

## 2014-06-12 DIAGNOSIS — W010XXA Fall on same level from slipping, tripping and stumbling without subsequent striking against object, initial encounter: Secondary | ICD-10-CM | POA: Insufficient documentation

## 2014-06-12 DIAGNOSIS — S161XXA Strain of muscle, fascia and tendon at neck level, initial encounter: Secondary | ICD-10-CM

## 2014-06-12 DIAGNOSIS — S39012A Strain of muscle, fascia and tendon of lower back, initial encounter: Secondary | ICD-10-CM

## 2014-06-12 DIAGNOSIS — IMO0002 Reserved for concepts with insufficient information to code with codable children: Secondary | ICD-10-CM | POA: Insufficient documentation

## 2014-06-12 DIAGNOSIS — T07XXXA Unspecified multiple injuries, initial encounter: Secondary | ICD-10-CM | POA: Insufficient documentation

## 2014-06-12 DIAGNOSIS — Z8739 Personal history of other diseases of the musculoskeletal system and connective tissue: Secondary | ICD-10-CM | POA: Diagnosis not present

## 2014-06-12 HISTORY — DX: Heart failure, unspecified: I50.9

## 2014-06-12 HISTORY — DX: Multiple sclerosis: G35

## 2014-06-12 MED ORDER — OXYCODONE-ACETAMINOPHEN 5-325 MG PO TABS
1.0000 | ORAL_TABLET | Freq: Once | ORAL | Status: AC
  Administered 2014-06-12: 1 via ORAL
  Filled 2014-06-12: qty 1

## 2014-06-12 NOTE — ED Notes (Addendum)
Pt states she slipped down 3 stairs. Pt denies loc but c/o left wrist, elbow, shoulder and left sided back pain. Pt has small cut to left elbow.

## 2014-06-12 NOTE — ED Provider Notes (Signed)
CSN: [id number]     Arrival date & time 06/12/14  2023 History   First MD Initiated Contact with Patient 06/12/14 2044     Chief Complaint  Patient presents with  . Fall     (Consider location/radiation/quality/duration/timing/severity/associated sxs/prior Treatment) HPI   Colleen Sellers is a 48 y.o. female who presents to the Emergency Department complaining of pain to her left side after a mechanical fall.  She states that she slipped on wet steps and "slid" down three concrete steps landing on her elbow and left lower back.  She states she has pain with any movement of the left shoulder, neck, elbow or wrist.  She denies head injury, dizziness, headaches, vomiting or LOC.  Incident occurred just prior to ED arrival.  She also reports having a small laceration to her left elbow.  States her tetanus vaccination update.  patient also reports h/o chronic back pain and fibromyalgia.   Past Medical History  Diagnosis Date  . IBS (irritable bowel syndrome)     Mild hematochezia; negative for celiac disease; mild Schatzki's ring; small hiatal hernia; minor gastric erosions; few diverticula  . Seizure disorder   . Depression   . Chronic back pain   . Chest pain     Dyspnea, diaphoresis  . Fibromyalgia   . Overweight(278.02)     Negative sleep study-2010, but treated for narcolepsy  . Surgical menopause 2002    age 82  . Degenerative joint disease     Also scoliosis and pectus deformity  . MS (multiple sclerosis)   . CHF (congestive heart failure)    Past Surgical History  Procedure Laterality Date  . Tubal ligation    . Cholecystectomy    . Colonoscopy  2001, 2010    negative/friable anal canal scattered pancolonic diverticula  . Vaginal hysterectomy  1996  . Salpingoophorectomy  2002    Bilateral  . Rhinoplasty      Deviated septum  . Esophagogastroduodenoscopy  11/22/08    noncritical Schatzi ring/small hiatal herina/tiny antral reosions   Family History  Problem Relation  Age of Onset  . Depression Mother     suicide  . Alcohol abuse Father   . Coronary artery disease Father    History  Substance Use Topics  . Smoking status: Never Smoker   . Smokeless tobacco: Never Used  . Alcohol Use: No     Comment: minimal use   OB History   Grav Para Term Preterm Abortions TAB SAB Ect Mult Living                 Review of Systems  Constitutional: Negative for fever and chills.  Respiratory: Negative for chest tightness and shortness of breath.   Cardiovascular: Negative for chest pain.  Gastrointestinal: Negative for nausea, vomiting and abdominal pain.  Genitourinary: Negative for dysuria, hematuria, flank pain and difficulty urinating.  Musculoskeletal: Positive for arthralgias and neck pain. Negative for joint swelling.  Skin: Negative for color change and wound.  Neurological: Negative for dizziness, syncope, weakness, light-headedness, numbness and headaches.  All other systems reviewed and are negative.     Allergies  Ceftin; Codeine; and Morphine  Home Medications   Prior to Admission medications   Medication Sig Start Date End Date Taking? Authorizing Provider  DULoxetine (CYMBALTA) 60 MG capsule Take 60 mg by mouth daily.   Yes Historical Provider, MD  albuterol (PROVENTIL HFA;VENTOLIN HFA) 108 (90 BASE) MCG/ACT inhaler Inhale 2 puffs into the lungs every 6 (six) hours  as needed.    Historical Provider, MD  ALPRAZolam Prudy Feeler) 1 MG tablet Take 1 mg by mouth at bedtime as needed.      Historical Provider, MD  amphetamine-dextroamphetamine (ADDERALL, ,) 20 MG tablet Take 20 mg by mouth 2 (two) times daily.      Historical Provider, MD  cetirizine (ZYRTEC) 10 MG tablet Take 10 mg by mouth daily.      Historical Provider, MD  divalproex (DEPAKOTE) 500 MG DR tablet Take 100 mg by mouth at bedtime.      Historical Provider, MD  furosemide (LASIX) 40 MG tablet Take 40 mg by mouth as needed.      Historical Provider, MD  Oxycodone-Acetaminophen  (PERCOCET PO) Take by mouth as needed.      Historical Provider, MD   BP 151/94  Pulse 75  Temp(Src) 98.2 F (36.8 C) (Oral)  Resp 20  Ht 5' 8.5" (1.74 m)  Wt 184 lb (83.462 kg)  BMI 27.57 kg/m2  SpO2 100%  LMP 09/16/1994 Physical Exam  Nursing note and vitals reviewed. Constitutional: She is oriented to person, place, and time. She appears well-developed and well-nourished. No distress.  HENT:  Head: Normocephalic and atraumatic.  Eyes: EOM are normal. Pupils are equal, round, and reactive to light.  Neck: Normal range of motion and phonation normal. Neck supple. Spinous process tenderness and muscular tenderness present. No thyromegaly present.    Diffuse ttp of the cervical spine and paraspinal muscles. No edema or step off deformity  Cardiovascular: Normal rate, regular rhythm, normal heart sounds and intact distal pulses.   No murmur heard. Pulmonary/Chest: Effort normal and breath sounds normal. No respiratory distress. She exhibits no tenderness.  Musculoskeletal: She exhibits tenderness. She exhibits no edema.  ttp of the left anterior and posterior shoulder.  Pain with abduction of the left arm and rotation of the shoulder. ttp and mild STS of the left elbow.  Small abrasion present just distal to olecranon process.   Radial pulse is brisk, distal sensation intact, CR< 2 sec. Grip strength is strong and symmetrical.   No  erythema or step-off deformity of the joint.   Lymphadenopathy:    She has no cervical adenopathy.  Neurological: She is alert and oriented to person, place, and time. She has normal strength. No sensory deficit. She exhibits normal muscle tone. Coordination normal.  Skin: Skin is warm and dry.    ED Course  Procedures (including critical care time) Labs Review Labs Reviewed - No data to display  Imaging Review Dg Cervical Spine Complete  06/12/2014   CLINICAL DATA:  Fall today. Left neck pain. Head tilt due to guarding from pain.  EXAM: CERVICAL  SPINE  4+ VIEWS  COMPARISON:  Cervical spine 11/01/2011  FINDINGS: The lower cervical spine below the C5-6 level is not visualized due to soft tissue attenuation and therefore remains indeterminate. In the visualized cervical spine, is no evidence of cervical spine fracture or prevertebral soft tissue swelling. Alignment is normal. No other significant bone abnormalities are identified.  IMPRESSION: No displaced fractures identified in the visualized cervical spine. Lower cervical levels are not visualized and remain indeterminate.   Electronically Signed   By: Burman Nieves M.D.   On: 06/12/2014 22:54   Dg Lumbar Spine Complete  06/12/2014   CLINICAL DATA:  Fall, left greater than right back pain  EXAM: LUMBAR SPINE - COMPLETE 4+ VIEW  COMPARISON:  09/21/2012  FINDINGS: Five lumbar type vertebral bodies.  Normal lumbar lordosis.  No evidence of fracture or dislocation. Vertebral body heights are maintained.  Mild degenerative changes at L4-5.  Visualized bony pelvis appears intact.  Cholecystectomy clips.  IMPRESSION: No fracture or dislocation is seen.  Degenerative changes at L4-5.   Electronically Signed   By: Charline Bills M.D.   On: 06/12/2014 22:54   Dg Elbow Complete Left  06/12/2014   CLINICAL DATA:  Fall, laceration to olecranon, elbow pain  EXAM: LEFT ELBOW - COMPLETE 3+ VIEW  COMPARISON:  None.  FINDINGS: No fracture or dislocation is seen.  The joint spaces are preserved.  No displaced elbow joint fat pads to suggest an elbow joint effusion.  Visualized soft tissues are grossly unremarkable.  No radiopaque foreign body is seen.  IMPRESSION: No fracture, dislocation, or radiopaque foreign body is seen.   Electronically Signed   By: Charline Bills M.D.   On: 06/12/2014 22:52   Dg Wrist Complete Left  06/12/2014   CLINICAL DATA:  Fall, wrist pain/laceration  EXAM: LEFT WRIST - COMPLETE 3+ VIEW  COMPARISON:  None.  FINDINGS: No fracture or dislocation is seen.  The joint spaces are  preserved.  Mild dorsal soft tissue swelling.  No radiopaque foreign body is seen.  IMPRESSION: No fracture, dislocation, or radiopaque foreign body is seen.   Electronically Signed   By: Charline Bills M.D.   On: 06/12/2014 22:54   Ct Cervical Spine Wo Contrast  06/12/2014   CLINICAL DATA:  Neck pain after a fall.  EXAM: CT CERVICAL SPINE WITHOUT CONTRAST  TECHNIQUE: Multidetector CT imaging of the cervical spine was performed without intravenous contrast. Multiplanar CT image reconstructions were also generated.  COMPARISON:  Cervical spine radiographs 06/12/2014  FINDINGS: Normal alignment of the cervical vertebrae and facet joints. No vertebral compression deformities. Intervertebral disc space heights are preserved. C1-2 articulation appears intact. Mild convexity of the cervical spine towards the right may indicate muscle spasm. No prevertebral soft tissue swelling. No focal bone lesion or bone destruction. Bone cortex and trabecular architecture appear intact. Soft tissues are unremarkable. Incidental note of degenerative changes in the temporomandibular joints.  IMPRESSION: Normal alignment of the cervical spine. No displaced fractures identified. Cervical spine convexity towards the right may indicate muscle spasm.   Electronically Signed   By: Burman Nieves M.D.   On: 06/12/2014 23:50   Dg Shoulder Left  06/12/2014   CLINICAL DATA:  Fall, limited range of motion  EXAM: LEFT SHOULDER - 2+ VIEW  COMPARISON:  None.  FINDINGS: No fracture or dislocation is seen.  The joint spaces are preserved.  The visualized soft tissues are unremarkable.  Visualized left lung is clear.  IMPRESSION: No fracture or dislocation is seen.   Electronically Signed   By: Charline Bills M.D.   On: 06/12/2014 22:50    EKG Interpretation None      MDM   Final diagnoses:  Multiple contusions  Cervical strain, acute, initial encounter  Lumbar strain, initial encounter   Abrasion to left elbow cleaned and  bandaged.  Imaging results discussed.  velcro splint and aling applied to left arm.  Pain improved, remains NV intact.  Pain improved after percocet.  Pt has pain medication and muscle relaxer at home.  She appears stable for d/c and agrees to return here if needed and to f/u with her PMD.       Kody Vigil L. Novak Stgermaine, PA-C 06/15/14 1624

## 2014-06-13 NOTE — Discharge Instructions (Signed)
Cervical Sprain °A cervical sprain is when the tissues (ligaments) that hold the neck bones in place stretch or tear. °HOME CARE  °· Put ice on the injured area. °· Put ice in a plastic bag. °· Place a towel between your skin and the bag. °· Leave the ice on for 15-20 minutes, 3-4 times a day. °· You may have been given a collar to wear. This collar keeps your neck from moving while you heal. °· Do not take the collar off unless told by your doctor. °· If you have long hair, keep it outside of the collar. °· Ask your doctor before changing the position of your collar. You may need to change its position over time to make it more comfortable. °· If you are allowed to take off the collar for cleaning or bathing, follow your doctor's instructions on how to do it safely. °· Keep your collar clean by wiping it with mild soap and water. Dry it completely. If the collar has removable pads, remove them every 1-2 days to hand wash them with soap and water. Allow them to air dry. They should be dry before you wear them in the collar. °· Do not drive while wearing the collar. °· Only take medicine as told by your doctor. °· Keep all doctor visits as told. °· Keep all physical therapy visits as told. °· Adjust your work station so that you have good posture while you work. °· Avoid positions and activities that make your problems worse. °· Warm up and stretch before being active. °GET HELP IF: °· Your pain is not controlled with medicine. °· You cannot take less pain medicine over time as planned. °· Your activity level does not improve as expected. °GET HELP RIGHT AWAY IF:  °· You are bleeding. °· Your stomach is upset. °· You have an allergic reaction to your medicine. °· You develop new problems that you cannot explain. °· You lose feeling (become numb) or you cannot move any part of your body (paralysis). °· You have tingling or weakness in any part of your body. °· Your symptoms get worse. Symptoms include: °· Pain,  soreness, stiffness, puffiness (swelling), or a burning feeling in your neck. °· Pain when your neck is touched. °· Shoulder or upper back pain. °· Limited ability to move your neck. °· Headache. °· Dizziness. °· Your hands or arms feel week, lose feeling, or tingle. °· Muscle spasms. °· Difficulty swallowing or chewing. °MAKE SURE YOU:  °· Understand these instructions. °· Will watch your condition. °· Will get help right away if you are not doing well or get worse. °Document Released: 02/19/2008 Document Revised: 05/05/2013 Document Reviewed: 03/10/2013 °ExitCare® Patient Information ©2015 [employer]. This information is not intended to replace advice given to you by your health care provider. Make sure you discuss any questions you have with your health care provider. ° °Lumbosacral Strain °Lumbosacral strain is a strain of any of the parts that make up your lumbosacral vertebrae. Your lumbosacral vertebrae are the bones that make up the lower third of your backbone. Your lumbosacral vertebrae are held together by muscles and tough, fibrous tissue (ligaments).  °CAUSES  °A sudden blow to your back can cause lumbosacral strain. Also, anything that causes an excessive stretch of the muscles in the low back can cause this strain. This is typically seen when people exert themselves strenuously, fall, lift heavy objects, bend, or crouch repeatedly. °RISK FACTORS °· Physically demanding work. °· Participation in pushing   or pulling sports or sports that require a sudden twist of the back (tennis, golf, baseball). °· Weight lifting. °· Excessive lower back curvature. °· Forward-tilted pelvis. °· Weak back or abdominal muscles or both. °· Tight hamstrings. °SIGNS AND SYMPTOMS  °Lumbosacral strain may cause pain in the area of your injury or pain that moves (radiates) down your leg.  °DIAGNOSIS °Your health care provider can often diagnose lumbosacral strain through a physical exam. In some cases, you may need tests  such as X-ray exams.  °TREATMENT  °Treatment for your lower back injury depends on many factors that your clinician will have to evaluate. However, most treatment will include the use of anti-inflammatory medicines. °HOME CARE INSTRUCTIONS  °· Avoid hard physical activities (tennis, racquetball, waterskiing) if you are not in proper physical condition for it. This may aggravate or create problems. °· If you have a back problem, avoid sports requiring sudden body movements. Swimming and walking are generally safer activities. °· Maintain good posture. °· Maintain a healthy weight. °· For acute conditions, you may put ice on the injured area. °¨ Put ice in a plastic bag. °¨ Place a towel between your skin and the bag. °¨ Leave the ice on for 20 minutes, 2-3 times a day. °· When the low back starts healing, stretching and strengthening exercises may be recommended. °SEEK MEDICAL CARE IF: °· Your back pain is getting worse. °· You experience severe back pain not relieved with medicines. °SEEK IMMEDIATE MEDICAL CARE IF:  °· You have numbness, tingling, weakness, or problems with the use of your arms or legs. °· There is a change in bowel or bladder control. °· You have increasing pain in any area of the body, including your belly (abdomen). °· You notice shortness of breath, dizziness, or feel faint. °· You feel sick to your stomach (nauseous), are throwing up (vomiting), or become sweaty. °· You notice discoloration of your toes or legs, or your feet get very cold. °MAKE SURE YOU:  °· Understand these instructions. °· Will watch your condition. °· Will get help right away if you are not doing well or get worse. °Document Released: 06/12/2005 Document Revised: 09/07/2013 Document Reviewed: 04/21/2013 °ExitCare® Patient Information ©2015 [employer]. This information is not intended to replace advice given to you by your health care provider. Make sure you discuss any questions you have with your health care  provider. ° °

## 2014-06-13 NOTE — ED Notes (Signed)
Discharge instructions given and reviewed with patient.  Patient verbalized understanding to follow up with orthopedics as needed.  Patient ambulatory; discharged home in good condition.

## 2014-06-15 ENCOUNTER — Telehealth: Payer: Self-pay | Admitting: Orthopedic Surgery

## 2014-06-15 NOTE — Telephone Encounter (Signed)
Patient called asking to make an appointment after an ER visit for falling down some steps. All x-rays were normal, patient does have [place] [employer] and Okey Regal and I suggested for this lady to see her PCP first beings there was numerous issues going on at once and then let her PCP refer her to Orthopedics. I called the patient and LMOM for her.

## 2014-06-20 NOTE — ED Provider Notes (Signed)
Medical screening examination/treatment/procedure(s) were performed by non-physician practitioner and as supervising physician I was immediately available for consultation/collaboration.   EKG Interpretation None       Irina Okelly, MD 06/20/14 0817 

## 2015-04-19 ENCOUNTER — Encounter (HOSPITAL_COMMUNITY): Payer: Self-pay | Admitting: *Deleted

## 2015-04-19 ENCOUNTER — Emergency Department (HOSPITAL_COMMUNITY): Payer: Medicaid Other

## 2015-04-19 ENCOUNTER — Observation Stay (HOSPITAL_COMMUNITY)
Admission: EM | Admit: 2015-04-19 | Discharge: 2015-04-20 | Disposition: A | Discharge status: Home / Self Care | Payer: Medicaid Other | Attending: Internal Medicine | Admitting: Internal Medicine

## 2015-04-19 DIAGNOSIS — I509 Heart failure, unspecified: Secondary | ICD-10-CM | POA: Insufficient documentation

## 2015-04-19 DIAGNOSIS — M797 Fibromyalgia: Secondary | ICD-10-CM | POA: Diagnosis not present

## 2015-04-19 DIAGNOSIS — G40909 Epilepsy, unspecified, not intractable, without status epilepticus: Secondary | ICD-10-CM | POA: Diagnosis not present

## 2015-04-19 DIAGNOSIS — G35 Multiple sclerosis: Secondary | ICD-10-CM | POA: Insufficient documentation

## 2015-04-19 DIAGNOSIS — R079 Chest pain, unspecified: Principal | ICD-10-CM | POA: Insufficient documentation

## 2015-04-19 DIAGNOSIS — Z91040 Latex allergy status: Secondary | ICD-10-CM | POA: Insufficient documentation

## 2015-04-19 DIAGNOSIS — F329 Major depressive disorder, single episode, unspecified: Secondary | ICD-10-CM | POA: Diagnosis not present

## 2015-04-19 DIAGNOSIS — R51 Headache: Secondary | ICD-10-CM | POA: Diagnosis not present

## 2015-04-19 DIAGNOSIS — M1990 Unspecified osteoarthritis, unspecified site: Secondary | ICD-10-CM | POA: Diagnosis not present

## 2015-04-19 DIAGNOSIS — R519 Headache, unspecified: Secondary | ICD-10-CM

## 2015-04-19 DIAGNOSIS — F32A Depression, unspecified: Secondary | ICD-10-CM | POA: Diagnosis present

## 2015-04-19 DIAGNOSIS — M549 Dorsalgia, unspecified: Secondary | ICD-10-CM

## 2015-04-19 DIAGNOSIS — K589 Irritable bowel syndrome without diarrhea: Secondary | ICD-10-CM | POA: Insufficient documentation

## 2015-04-19 DIAGNOSIS — N958 Other specified menopausal and perimenopausal disorders: Secondary | ICD-10-CM | POA: Diagnosis not present

## 2015-04-19 DIAGNOSIS — R55 Syncope and collapse: Secondary | ICD-10-CM | POA: Diagnosis not present

## 2015-04-19 DIAGNOSIS — Z79899 Other long term (current) drug therapy: Secondary | ICD-10-CM | POA: Insufficient documentation

## 2015-04-19 DIAGNOSIS — Q676 Pectus excavatum: Secondary | ICD-10-CM

## 2015-04-19 DIAGNOSIS — G8929 Other chronic pain: Secondary | ICD-10-CM | POA: Insufficient documentation

## 2015-04-19 DIAGNOSIS — E663 Overweight: Secondary | ICD-10-CM | POA: Diagnosis not present

## 2015-04-19 LAB — CBC WITH DIFFERENTIAL/PLATELET
Basophils Absolute: 0 10*3/uL (ref 0.0–0.1)
Basophils Relative: 0 % (ref 0–1)
EOS ABS: 0.1 10*3/uL (ref 0.0–0.7)
Eosinophils Relative: 1 % (ref 0–5)
HEMATOCRIT: 41.5 % (ref 36.0–46.0)
Hemoglobin: 14.1 g/dL (ref 12.0–15.0)
LYMPHS PCT: 26 % (ref 12–46)
Lymphs Abs: 2.7 10*3/uL (ref 0.7–4.0)
MCH: 30.6 pg (ref 26.0–34.0)
MCHC: 34 g/dL (ref 30.0–36.0)
MCV: 90 fL (ref 78.0–100.0)
MONO ABS: 0.7 10*3/uL (ref 0.1–1.0)
Monocytes Relative: 6 % (ref 3–12)
NEUTROS ABS: 6.9 10*3/uL (ref 1.7–7.7)
Neutrophils Relative %: 67 % (ref 43–77)
PLATELETS: 334 10*3/uL (ref 150–400)
RBC: 4.61 MIL/uL (ref 3.87–5.11)
RDW: 12.1 % (ref 11.5–15.5)
WBC: 10.4 10*3/uL (ref 4.0–10.5)

## 2015-04-19 LAB — BASIC METABOLIC PANEL
Anion gap: 10 (ref 5–15)
BUN: 8 mg/dL (ref 6–20)
CHLORIDE: 101 mmol/L (ref 101–111)
CO2: 28 mmol/L (ref 22–32)
Calcium: 9.4 mg/dL (ref 8.9–10.3)
Creatinine, Ser: 1.07 mg/dL — ABNORMAL HIGH (ref 0.44–1.00)
GFR calc Af Amer: >60 mL/min (ref >60)
GFR, EST NON AFRICAN AMERICAN: 59 mL/min — AB (ref >60)
Glucose, Bld: 113 mg/dL — ABNORMAL HIGH (ref 65–99)
Potassium: 3.9 mmol/L (ref 3.5–5.1)
SODIUM: 139 mmol/L (ref 135–145)

## 2015-04-19 LAB — TROPONIN I

## 2015-04-19 LAB — D-DIMER, QUANTITATIVE (NOT AT ARMC): D DIMER QUANT: 0.32 ug{FEU}/mL (ref 0.00–0.48)

## 2015-04-19 LAB — BRAIN NATRIURETIC PEPTIDE: B Natriuretic Peptide: 15 pg/mL (ref 0.0–100.0)

## 2015-04-19 NOTE — ED Notes (Signed)
Pt c/o chest tightness that radiates to right shoulder and right arm that started around 4pm today, pt also states that she "passed out:" at home, unsure of how long she was out, pain is associated sob, dizziness, weakness, diaphoresis, nausea

## 2015-04-19 NOTE — ED Provider Notes (Signed)
CSN: [id number]     Arrival date & time 04/19/15  2134 History  This chart was scribed for Glynn Octave, MD by Budd Palmer, ED Scribe. This patient was seen in room APA12/APA12 and the patient's care was started at 10:04 PM.    Chief Complaint  Patient presents with  . Chest Pain   The history is provided by the patient. No language interpreter was used.   HPI Comments: Colleen Sellers is a 49 y.o. female who presents to the Emergency Department complaining of constant, worsening, pressure like, left-sided CP onset 6 hours ago. She states the pain radiates down to her left arm. Pt notes the CP began as a tightness earlier today and felt her heart racing at time of onset. She had a sudden-onset HA 5 hours ago and diaphoresis. She made it to her bed when she felt dizzy, nauseated and as though her heart slowed down and stopped, after which she passed out. After waking up again she felt dizzy and experienced tunnel vision. She states that the HA has abated some. She has never had similar pain before. She has had an episode of LOC a long time ago, but denies similar previous symptoms. She notes no exacerbating or alleviating factors.She has a PMHx of CHF but never an MI. She also has a PMHx of fibromyalgia, HLD, and MS. She has a PSHx of cholecystectomy. She reports associated SOB, blurry vision, nausea, and leg swelling.  Past Medical History  Diagnosis Date  . IBS (irritable bowel syndrome)     Mild hematochezia; negative for celiac disease; mild Schatzki's ring; small hiatal hernia; minor gastric erosions; few diverticula  . Seizure disorder   . Depression   . Chronic back pain   . Chest pain     Dyspnea, diaphoresis  . Fibromyalgia   . Overweight(278.02)     Negative sleep study-2010, but treated for narcolepsy  . Surgical menopause 2002    age 24  . Degenerative joint disease     Also scoliosis and pectus deformity  . MS (multiple sclerosis)   . CHF (congestive heart failure)     Past Surgical History  Procedure Laterality Date  . Tubal ligation    . Cholecystectomy    . Colonoscopy  2001, 2010    negative/friable anal canal scattered pancolonic diverticula  . Vaginal hysterectomy  1996  . Salpingoophorectomy  2002    Bilateral  . Rhinoplasty      Deviated septum  . Esophagogastroduodenoscopy  11/22/08    noncritical Schatzi ring/small hiatal herina/tiny antral reosions  . Brain surgery     Family History  Problem Relation Age of Onset  . Depression Mother     suicide  . Alcohol abuse Father   . Coronary artery disease Father    History  Substance Use Topics  . Smoking status: Never Smoker   . Smokeless tobacco: Never Used  . Alcohol Use: No     Comment: minimal use   OB History    No data available     Review of Systems A complete 10 system review of systems was obtained and all systems are negative except as noted in the HPI and PMH.    Allergies  Ceftin; Codeine; Morphine; Adhesive; and Latex  Home Medications   Prior to Admission medications   Medication Sig Start Date End Date Taking? Authorizing Provider  ADDERALL XR 30 MG 24 hr capsule Take 30 mg by mouth every morning. 03/08/15  Yes Historical Provider, MD  albuterol (PROVENTIL HFA;VENTOLIN HFA) 108 (90 BASE) MCG/ACT inhaler Inhale 2 puffs into the lungs every 6 (six) hours as needed for wheezing or shortness of breath.    Yes Historical Provider, MD  amphetamine-dextroamphetamine (ADDERALL) 30 MG tablet Take 30 mg by mouth 2 (two) times daily.   Yes Historical Provider, MD  cyanocobalamin (,VITAMIN B-12,) 1000 MCG/ML injection INJECT ONE ML INTRAMUSCULARLY TWICE A MONTH 04/12/15  Yes Historical Provider, MD  diazepam (VALIUM) 10 MG tablet Take 10 mg by mouth at bedtime.   Yes Historical Provider, MD  oxyCODONE (OXY IR/ROXICODONE) 5 MG immediate release tablet Take 5 mg by mouth every 4 (four) hours as needed for severe pain.   Yes Historical Provider, MD  eszopiclone (LUNESTA) 2 MG  TABS tablet Take 2 mg by mouth at bedtime as needed for sleep.  04/12/15   Historical Provider, MD  furosemide (LASIX) 40 MG tablet Take 40 mg by mouth as needed for fluid.     Historical Provider, MD   BP 148/87 mmHg  Pulse 72  Temp(Src) 98.1 F (36.7 C) (Oral)  Resp 16  Ht  (1.727 m)  Wt 162 lb (73.483 kg)  BMI 24.64 kg/m2  SpO2 99%  LMP 09/16/1994 Physical Exam  Constitutional: She is oriented to person, place, and time. She appears well-developed and well-nourished. No distress.  HENT:  Head: Normocephalic and atraumatic.  Mouth/Throat: Oropharynx is clear and moist. No oropharyngeal exudate.  Eyes: Conjunctivae and EOM are normal. Pupils are equal, round, and reactive to light.  Neck: Normal range of motion. Neck supple.  No meningismus.  Cardiovascular: Normal rate, regular rhythm, normal heart sounds and intact distal pulses.   No murmur heard. Pulmonary/Chest: Effort normal and breath sounds normal. No respiratory distress. She exhibits tenderness.  Pectus excavatum TTP L chest wall  Abdominal: Soft. There is no tenderness. There is no rebound and no guarding.  Musculoskeletal: Normal range of motion. She exhibits no edema or tenderness.  Neurological: She is alert and oriented to person, place, and time. No cranial nerve deficit. She exhibits normal muscle tone. Coordination normal.  No ataxia on finger to nose bilaterally. No pronator drift. 5/5 strength throughout. CN 2-12 intact. Negative Romberg. Equal grip strength. Sensation intact. Gait is normal.   Skin: Skin is warm.  Psychiatric: She has a normal mood and affect. Her behavior is normal.  Nursing note and vitals reviewed.   ED Course  Procedures  DIAGNOSTIC STUDIES: Oxygen Saturation is 100% on RA, normal by my interpretation.    COORDINATION OF CARE: 10:13 PM - Discussed plans to order diagnostic studies and imaging. Pt advised of plan for treatment and pt agrees.  10:31 PM - Saw pt again to obtain  the full HPI after being called away on a consult for another pt.   Labs Review Labs Reviewed  BASIC METABOLIC PANEL - Abnormal; Notable for the following:    Glucose, Bld 113 (*)    Creatinine, Ser 1.07 (*)    GFR calc non Af Amer 59 (*)    All other components within normal limits  CBC WITH DIFFERENTIAL/PLATELET  TROPONIN I  BRAIN NATRIURETIC PEPTIDE  D-DIMER, QUANTITATIVE (NOT AT [facility])    Imaging Review Dg Chest 2 View  04/19/2015   CLINICAL DATA:  Chest pain radiating to RIGHT shoulder and arm beginning at 4 p.m. today. Shortness of breath, dizziness, weakness, diaphoresis, nausea. Syncopal episode at home. History of CHF, seizures, chest pain.  EXAM: CHEST  2 VIEW  COMPARISON:  Thoracic spine radiograph September 21, 2012  FINDINGS: The cardiac silhouette appears mildly enlarged though, likely artifactual due to pectus excavatum pectus excavatum. No pleural effusion or focal consolidation. No pneumothorax. Surgical clips in the included right abdomen compatible with cholecystectomy.  IMPRESSION: No acute cardiopulmonary process.   Electronically Signed   By: Awilda Metro M.D.   On: 04/19/2015 22:56     EKG Interpretation   Date/Time:  Wednesday April 19 2015 21:45:05 EDT Ventricular Rate:  72 PR Interval:  169 QRS Duration: 70 QT Interval:  410 QTC Calculation: 449 R Axis:   33 Text Interpretation:  Sinus rhythm Low voltage, precordial leads No  significant change was found Confirmed by Manus Gunning  MD, Domino Holten [phone]) on  04/19/2015 9:56:42 PM      MDM   Final diagnoses:  Headache, unspecified headache type  Chest pain, unspecified chest pain type   Patient reports onset of chest tightness and pressure in her left chest that started around 4 PM today this radiates to the right side of her chest. Is associated with some shortness of breath, dizziness, nausea, diaphoresis. Her chest wall is sore but this does not reproduce her pain.  EKG is normal sinus rhythm. Patient  denies any cardiac history she reports no stents in her heart. She does report she has a history of "fluid" and takes Lasix. She reports a negative stress test in 2012.  Patient reports that she had a sudden onset headache around 6 pm. Because the pain got so bad. This was followed about an hour later by a syncopal episode after episode of tunnel vision.  She reports the headache is since improved. She has no focal weakness, numbness or tingling. Her sudden onset headache is concerning for possible subarachnoid hemorrhage. CT scan is not available tonight.  Transfer arranged to [facility] for CT scan. Discussed with Dr. Karma Ganja.  Patient's chest pain is somewhat atypical in that has been constant since 4 PM. Her EKG is normal. Troponin is negative. D-dimer is negative. Chest x-rays negative.  CT scan will likely be obtained greater than 6 hours after her sudden onset headache. She may require CT angiogram as well to rule out subarachnoid hemorrhage.  Her story of chest pain in the setting of syncope is concerning and observation admission is likely reasonable.    Addendum: CT now available.  CT negative for [facility]  CTA negative for dissection or aneurysm.  Doubt SAH, meningitis, temporal arteritis.  Admission for chest pain in setting of syncope d/w Dr. Sharl Ma.  I personally performed the services described in this documentation, which was scribed in my presence. The recorded information has been reviewed and is accurate.   Glynn Octave, MD 04/20/15 [phone]

## 2015-04-20 ENCOUNTER — Encounter (HOSPITAL_COMMUNITY): Payer: Self-pay

## 2015-04-20 ENCOUNTER — Emergency Department (HOSPITAL_COMMUNITY): Payer: Medicaid Other

## 2015-04-20 DIAGNOSIS — G8929 Other chronic pain: Secondary | ICD-10-CM

## 2015-04-20 DIAGNOSIS — M549 Dorsalgia, unspecified: Secondary | ICD-10-CM

## 2015-04-20 DIAGNOSIS — R079 Chest pain, unspecified: Principal | ICD-10-CM

## 2015-04-20 DIAGNOSIS — F329 Major depressive disorder, single episode, unspecified: Secondary | ICD-10-CM

## 2015-04-20 DIAGNOSIS — R519 Headache, unspecified: Secondary | ICD-10-CM | POA: Insufficient documentation

## 2015-04-20 DIAGNOSIS — Q676 Pectus excavatum: Secondary | ICD-10-CM

## 2015-04-20 DIAGNOSIS — M797 Fibromyalgia: Secondary | ICD-10-CM

## 2015-04-20 DIAGNOSIS — R51 Headache: Secondary | ICD-10-CM

## 2015-04-20 LAB — COMPREHENSIVE METABOLIC PANEL
ALT: 16 U/L (ref 14–54)
ANION GAP: 8 (ref 5–15)
AST: 21 U/L (ref 15–41)
Albumin: 4.2 g/dL (ref 3.5–5.0)
Alkaline Phosphatase: 73 U/L (ref 38–126)
BILIRUBIN TOTAL: 0.5 mg/dL (ref 0.3–1.2)
BUN: 7 mg/dL (ref 6–20)
CALCIUM: 9 mg/dL (ref 8.9–10.3)
CO2: 27 mmol/L (ref 22–32)
Chloride: 100 mmol/L — ABNORMAL LOW (ref 101–111)
Creatinine, Ser: 0.95 mg/dL (ref 0.44–1.00)
GFR calc non Af Amer: >60 mL/min (ref >60)
GLUCOSE: 100 mg/dL — AB (ref 65–99)
Potassium: 3.6 mmol/L (ref 3.5–5.1)
Sodium: 135 mmol/L (ref 135–145)
TOTAL PROTEIN: 7 g/dL (ref 6.5–8.1)

## 2015-04-20 LAB — CBC
HEMATOCRIT: 40.4 % (ref 36.0–46.0)
Hemoglobin: 13.8 g/dL (ref 12.0–15.0)
MCH: 30.5 pg (ref 26.0–34.0)
MCHC: 34.2 g/dL (ref 30.0–36.0)
MCV: 89.4 fL (ref 78.0–100.0)
Platelets: 355 10*3/uL (ref 150–400)
RBC: 4.52 MIL/uL (ref 3.87–5.11)
RDW: 12.1 % (ref 11.5–15.5)
WBC: 8.6 10*3/uL (ref 4.0–10.5)

## 2015-04-20 LAB — TROPONIN I
Troponin I: <0.03 ng/mL (ref <0.031)
Troponin I: <0.03 ng/mL (ref <0.031)

## 2015-04-20 MED ORDER — DIAZEPAM 5 MG PO TABS: 10.0000 mg | ORAL_TABLET | Freq: Every day | ORAL | Status: DC

## 2015-04-20 MED ORDER — OXYCODONE HCL 5 MG PO TABS
5.0000 mg | ORAL_TABLET | ORAL | Status: DC | PRN
  Administered 2015-04-20 (×2): 5 mg via ORAL
  Filled 2015-04-20 (×2): qty 1

## 2015-04-20 MED ORDER — ACETAMINOPHEN 650 MG RE SUPP
650.0000 mg | Freq: Four times a day (QID) | RECTAL | Status: DC | PRN
Start: 2015-04-20 — End: 2015-04-20

## 2015-04-20 MED ORDER — IOHEXOL 350 MG/ML SOLN
100.0000 mL | Freq: Once | INTRAVENOUS | Status: AC | PRN
  Administered 2015-04-20: 100 mL via INTRAVENOUS

## 2015-04-20 MED ORDER — ALBUTEROL SULFATE (2.5 MG/3ML) 0.083% IN NEBU: 3.0000 mL | INHALATION_SOLUTION | Freq: Four times a day (QID) | RESPIRATORY_TRACT | Status: DC | PRN

## 2015-04-20 MED ORDER — AMPHETAMINE-DEXTROAMPHET ER 30 MG PO CP24
30.0000 mg | ORAL_CAPSULE | Freq: Every morning | ORAL | Status: DC
  Administered 2015-04-20: 30 mg via ORAL
  Filled 2015-04-20: qty 1

## 2015-04-20 MED ORDER — DIAZEPAM 5 MG PO TABS
10.0000 mg | ORAL_TABLET | Freq: Once | ORAL | Status: AC
  Administered 2015-04-20: 10 mg via ORAL
  Filled 2015-04-20: qty 2

## 2015-04-20 MED ORDER — ACETAMINOPHEN 325 MG PO TABS
650.0000 mg | ORAL_TABLET | Freq: Four times a day (QID) | ORAL | Status: DC | PRN
Start: 2015-04-20 — End: 2015-04-20

## 2015-04-20 MED ORDER — ENOXAPARIN SODIUM 40 MG/0.4ML ~~LOC~~ SOLN
40.0000 mg | SUBCUTANEOUS | Status: DC
  Administered 2015-04-20: 40 mg via SUBCUTANEOUS
  Filled 2015-04-20: qty 0.4

## 2015-04-20 MED ORDER — SODIUM CHLORIDE 0.9 % IV SOLN
INTRAVENOUS | Status: DC
  Administered 2015-04-20: 04:00:00 via INTRAVENOUS

## 2015-04-20 NOTE — Care Management Note (Signed)
Case Management Note  Patient Details  Name: Colleen Sellers MRN: [id number] Date of Birth: [birth year]  Expected Discharge Date:                  Expected Discharge Plan:  Home/Self Care  In-House Referral:  NA  Discharge planning Services  CM Consult  Post Acute Care Choice:  NA Choice offered to:  NA  DME Arranged:    DME Agency:     HH Arranged:    HH Agency:     Status of Service:  Completed, signed off  Medicare Important Message Given:    Date Medicare IM Given:    Medicare IM give by:    Date Additional Medicare IM Given:    Additional Medicare Important Message give by:     If discussed at Long Length of Stay Meetings, dates discussed:    Additional Comments: Pt admitted with chest pain. Pt is from home and independent with ADL's. Pt discharging home today and says she has no PCP, has seen Dr. Juanetta Gosling since 1991 and they now wont see her because she has medicaid. Dr Juanetta Gosling office call and CM spoke with office manager Okey Regal, per Okey Regal patient can no longer be seen by Dr. Juanetta Gosling. Okey Regal said  She cannot give reason why patient cannot be seen, pt may call to find out why but (we should instruct her) she can not come to the office. CM relayed this info to the patient. Pt will need to follow up at the [facility] clinic, pt does not want to go to the health department. CM called [facility] and office is closed today, pt given brochure and told to call in morning to make follow up appointment. No further CM needs, patient discharging home today.  Malcolm Metro, RN 04/20/2015, 2:46 PM

## 2015-04-20 NOTE — ED Notes (Signed)
   04/20/15 0057  Cardiac  Cardiac (WDL) X  Cardiac Regularity Regular  Cardiac Rhythm NSR  Bedside Cardiac Monitor On Yes  Bedside Cardiac Audible Yes  Bedside Cardiac Alarms Set Yes  Cap Refill <3 Sec

## 2015-04-20 NOTE — ED Notes (Signed)
   04/19/15 2145  Chest Pain Assessment  Occurrence Today  Chronicity New  Chest Pain Location Right chest  Pain Descriptors / Indicators Tightness  Associated Symptoms Shortness of breath;Dizziness;Lightheadedness;Diaphoresis;Weakness

## 2015-04-20 NOTE — Discharge Summary (Signed)
Physician Discharge Summary  Colleen Sellers ZOX:[id number] DOB: 1965-09-23 DOA: 04/19/2015  PCP: Fredirick Maudlin, MD  Admit date: 04/19/2015 Discharge date: 04/20/2015  Time spent: 35 minutes  Recommendations for Outpatient Follow-up:  1. Please follow up on Chest Pain symptoms, which could be related to anxiety vs fibromyalgia 2. She was set up with outpatient stress test  Discharge Diagnoses:  Active Problems:   Depression   Chronic back pain   Congenital pectus excavatum   Chest pain   Discharge Condition: Stable  Diet recommendation: Heart Healthy  Filed Weights   04/19/15 2150 04/20/15 0201  Weight: 73.483 kg (162 lb) 77.202 kg (170 lb 3.2 oz)    History of present illness:  49 year old female who  has a past medical history of IBS (irritable bowel syndrome); Seizure disorder; Depression; Chronic back pain; Chest pain; Fibromyalgia; Overweight(278.02); Surgical menopause (2002); Degenerative joint disease; MS (multiple sclerosis); and CHF (congestive heart failure). Today patient came to the hospital with pressure like feeling in the left side chest which started last evening. Patient says that she also had sudden onset headache along with diaphoresis. The headache was mainly in the left side. Patient says that she felt dizzy and experienced on observation. Patient has had similar episodes of headache in the past. In the ED CT angiogram of the head and neck was done which was negative.  Hospital Course:  Patient is a 49 year old female with a past medical history of fibromyalgia, seizure disorder, chronic pain syndrome, fibromyalgia admitted to medicine service on 04/20/2015 which presented with complaints of left-sided chest pain. She reported that symptoms started yesterday well at the social services department where she had been dealing with issues regarding her grandchildren. She reported being under great stress and developed chest pain located in the retrosternal and  left-sided region. Initial workup included an EKG that did not reveal acute ischemic changes. Her troponins were cycled and remained negative. Suspect the cause of her chest pain to be related to fibromyalgia given symptoms reproducible to palpation or perhaps related to anxiety given context of her chest pain. By the following morning she reported feeling better and felt well to go home. She was set up with outpatient stress test.  Discharge Exam: Filed Vitals:   04/20/15 0629  BP: 82/50  Pulse: 64  Temp: 97.9 F (36.6 C)  Resp: 18    General: No acute distress, patient is awake and alert, following commands Cardiovascular: Regular rate and rhythm normal S1-S2 no murmurs rubs or gallops Respiratory: Normal respiratory effort, lungs are clear to auscultation bilaterally Abdomen: Soft nontender nondistended Musculoskeletal: Patient reporting pain to palpation across her chest  Discharge Instructions   Discharge Instructions    Call MD for:  difficulty breathing, headache or visual disturbances    Complete by:  As directed      Call MD for:  extreme fatigue    Complete by:  As directed      Call MD for:  hives    Complete by:  As directed      Call MD for:  persistant dizziness or light-headedness    Complete by:  As directed      Call MD for:  persistant nausea and vomiting    Complete by:  As directed      Call MD for:  redness, tenderness, or signs of infection (pain, swelling, redness, odor or green/yellow discharge around incision site)    Complete by:  As directed      Call MD for:  severe uncontrolled pain    Complete by:  As directed      Call MD for:  temperature >100.4    Complete by:  As directed      Call MD for:    Complete by:  As directed      Diet - low sodium heart healthy    Complete by:  As directed      Increase activity slowly    Complete by:  As directed           Current Discharge Medication List    CONTINUE these medications which have NOT  CHANGED   Details  ADDERALL XR 30 MG 24 hr capsule Take 30 mg by mouth every morning. Refills: 0    albuterol (PROVENTIL HFA;VENTOLIN HFA) 108 (90 BASE) MCG/ACT inhaler Inhale 2 puffs into the lungs every 6 (six) hours as needed for wheezing or shortness of breath.     cyanocobalamin (,VITAMIN B-12,) 1000 MCG/ML injection INJECT ONE ML INTRAMUSCULARLY TWICE A MONTH Refills: 2    diazepam (VALIUM) 10 MG tablet Take 10 mg by mouth at bedtime.    oxyCODONE (OXY IR/ROXICODONE) 5 MG immediate release tablet Take 5 mg by mouth every 4 (four) hours as needed for severe pain.    eszopiclone (LUNESTA) 2 MG TABS tablet Take 2 mg by mouth at bedtime as needed for sleep.  Refills: 5    furosemide (LASIX) 40 MG tablet Take 40 mg by mouth as needed for fluid.       STOP taking these medications     amphetamine-dextroamphetamine (ADDERALL) 30 MG tablet        Allergies  Allergen Reactions  . Ceftin Other (See Comments)    Unknown  . Codeine Nausea And Vomiting  . Morphine Hives and Itching  . Adhesive [Tape] Rash  . Latex Rash   Follow-up Information    Follow up with HAWKINS,EDWARD L, MD In 1 week.   Specialty:  Pulmonary Disease   Contact information:   [address] [place] [address] [phone]        The results of significant diagnostics from this hospitalization (including imaging, microbiology, ancillary and laboratory) are listed below for reference.    Significant Diagnostic Studies: Ct Angio Head W/cm &/or Wo Cm  04/20/2015   CLINICAL DATA:  Acute onset headache 5 hours ago with diaphoresis, dizziness, nausea, syncope. History of multiple sclerosis, seizure.  EXAM: CT ANGIOGRAPHY HEAD AND NECK  TECHNIQUE: Multidetector CT imaging of the head and neck was performed using the standard protocol during bolus administration of intravenous contrast. Multiplanar CT image reconstructions and MIPs were obtained to evaluate the vascular anatomy. Carotid  stenosis measurements (when applicable) are obtained utilizing NASCET criteria, using the distal internal carotid diameter as the denominator.  CONTRAST:  OMNIPAQUE IOHEXOL 350 MG/ML SOLN  COMPARISON:  CT head Feb 09, 2006  FINDINGS: CT HEAD  The ventricles and sulci are normal. No intraparenchymal hemorrhage, mass effect nor midline shift. No acute large vascular territory infarcts. No abnormal intracranial enhancement.  No abnormal extra-axial fluid collections. Basal cisterns are patent.  No skull fracture. The included ocular globes and orbital contents are non-suspicious. The mastoid aircells and included paranasal sinuses are well-aerated. Partially imaged probable FESS.  CTA NECK  Normal appearance of the thoracic arch, normal branch pattern. The origins of the innominate, left Common carotid artery and subclavian artery are widely patent.  Bilateral Common carotid arteries are widely patent, coursing in a straight  line fashion. Normal appearance of the carotid bifurcations without hemodynamically significant stenosis by NASCET criteria. Normal appearance of the included internal carotid arteries.  RIGHT vertebral artery is dominant. Normal appearance of the vertebral arteries, which appear widely patent.  No dissection, no pseudoaneurysm. No abnormal luminal irregularity. No contrast extravasation.  Soft tissues are nonsuspicious. Suboccipital sebaceous. No acute osseous process though bone windows have not been submitted. Pectus excavatum partially imaged.  CTA HEAD  Anterior circulation: Normal appearance of the cervical internal carotid arteries, petrous, cavernous and supra clinoid internal carotid arteries. Widely patent anterior communicating artery. Normal appearance of the anterior and middle cerebral arteries.  Posterior circulation: RIGHT vertebral artery is dominant with normal appearance of the vertebral arteries, vertebrobasilar junction and basilar artery, as well as main branch vessels.  Fenestrated proximal basilar artery. Robust LEFT posterior communicating artery contributing to posterior circulation. Normal appearance of the posterior cerebral arteries.  No large vessel occlusion, hemodynamically significant stenosis, dissection, luminal irregularity, contrast extravasation or aneurysm within the anterior nor posterior circulation.  IMPRESSION: CT HEAD: No acute intracranial process; normal noncontrast CT Head.  CTA NECK: Normal CT angiogram of the neck, no acute vascular process.  CTA HEAD: Normal CT angiogram of the head, no acute vascular process.   Electronically Signed   By: Awilda Metro M.D.   On: 04/20/2015 01:11   Dg Chest 2 View  04/19/2015   CLINICAL DATA:  Chest pain radiating to RIGHT shoulder and arm beginning at 4 p.m. today. Shortness of breath, dizziness, weakness, diaphoresis, nausea. Syncopal episode at home. History of CHF, seizures, chest pain.  EXAM: CHEST  2 VIEW  COMPARISON:  Thoracic spine radiograph September 21, 2012  FINDINGS: The cardiac silhouette appears mildly enlarged though, likely artifactual due to pectus excavatum pectus excavatum. No pleural effusion or focal consolidation. No pneumothorax. Surgical clips in the included right abdomen compatible with cholecystectomy.  IMPRESSION: No acute cardiopulmonary process.   Electronically Signed   By: Awilda Metro M.D.   On: 04/19/2015 22:56   Ct Angio Neck W/cm &/or Wo/cm  04/20/2015   CLINICAL DATA:  Acute onset headache 5 hours ago with diaphoresis, dizziness, nausea, syncope. History of multiple sclerosis, seizure.  EXAM: CT ANGIOGRAPHY HEAD AND NECK  TECHNIQUE: Multidetector CT imaging of the head and neck was performed using the standard protocol during bolus administration of intravenous contrast. Multiplanar CT image reconstructions and MIPs were obtained to evaluate the vascular anatomy. Carotid stenosis measurements (when applicable) are obtained utilizing NASCET criteria, using the distal  internal carotid diameter as the denominator.  CONTRAST:  OMNIPAQUE IOHEXOL 350 MG/ML SOLN  COMPARISON:  CT head Feb 09, 2006  FINDINGS: CT HEAD  The ventricles and sulci are normal. No intraparenchymal hemorrhage, mass effect nor midline shift. No acute large vascular territory infarcts. No abnormal intracranial enhancement.  No abnormal extra-axial fluid collections. Basal cisterns are patent.  No skull fracture. The included ocular globes and orbital contents are non-suspicious. The mastoid aircells and included paranasal sinuses are well-aerated. Partially imaged probable FESS.  CTA NECK  Normal appearance of the thoracic arch, normal branch pattern. The origins of the innominate, left Common carotid artery and subclavian artery are widely patent.  Bilateral Common carotid arteries are widely patent, coursing in a straight line fashion. Normal appearance of the carotid bifurcations without hemodynamically significant stenosis by NASCET criteria. Normal appearance of the included internal carotid arteries.  RIGHT vertebral artery is dominant. Normal appearance of the vertebral arteries, which appear widely patent.  No dissection, no pseudoaneurysm. No abnormal luminal irregularity. No contrast extravasation.  Soft tissues are nonsuspicious. Suboccipital sebaceous. No acute osseous process though bone windows have not been submitted. Pectus excavatum partially imaged.  CTA HEAD  Anterior circulation: Normal appearance of the cervical internal carotid arteries, petrous, cavernous and supra clinoid internal carotid arteries. Widely patent anterior communicating artery. Normal appearance of the anterior and middle cerebral arteries.  Posterior circulation: RIGHT vertebral artery is dominant with normal appearance of the vertebral arteries, vertebrobasilar junction and basilar artery, as well as main branch vessels. Fenestrated proximal basilar artery. Robust LEFT posterior communicating artery contributing to  posterior circulation. Normal appearance of the posterior cerebral arteries.  No large vessel occlusion, hemodynamically significant stenosis, dissection, luminal irregularity, contrast extravasation or aneurysm within the anterior nor posterior circulation.  IMPRESSION: CT HEAD: No acute intracranial process; normal noncontrast CT Head.  CTA NECK: Normal CT angiogram of the neck, no acute vascular process.  CTA HEAD: Normal CT angiogram of the head, no acute vascular process.   Electronically Signed   By: Awilda Metro M.D.   On: 04/20/2015 01:11    Microbiology: No results found for this or any previous visit (from the past 240 hour(s)).   Labs: Basic Metabolic Panel:  Recent Labs Lab 04/19/15 2231 04/20/15 0335  NA 139 135  K 3.9 3.6  CL 101 100*  CO2 28 27  GLUCOSE 113* 100*  BUN 8 7  CREATININE 1.07* 0.95  CALCIUM 9.4 9.0   Liver Function Tests:  Recent Labs Lab 04/20/15 0335  AST 21  ALT 16  ALKPHOS 73  BILITOT 0.5  PROT 7.0  ALBUMIN 4.2   No results for input(s): LIPASE, AMYLASE in the last 168 hours. No results for input(s): AMMONIA in the last 168 hours. CBC:  Recent Labs Lab 04/19/15 2231 04/20/15 0335  WBC 10.4 8.6  NEUTROABS 6.9  --   HGB 14.1 13.8  HCT 41.5 40.4  MCV 90.0 89.4  PLT 334 355   Cardiac Enzymes:  Recent Labs Lab 04/19/15 2231 04/20/15 0335  TROPONINI <0.03 <0.03   BNP: BNP (last 3 results)  Recent Labs  04/19/15 2231  BNP 15.0    ProBNP (last 3 results) No results for input(s): PROBNP in the last 8760 hours.  CBG: No results for input(s): GLUCAP in the last 168 hours.     SignedJeralyn Bennett  [facility] 04/20/2015, 7:58 AM

## 2015-04-20 NOTE — Progress Notes (Signed)
Patient states understanding of discharge instructions.  

## 2015-04-20 NOTE — H&P (Signed)
PCP:   Fredirick Maudlin, MD   Chief Complaint:  Chest pain  HPI: 49 year old female who   has a past medical history of IBS (irritable bowel syndrome); Seizure disorder; Depression; Chronic back pain; Chest pain; Fibromyalgia; Overweight(278.02); Surgical menopause (2002); Degenerative joint disease; MS (multiple sclerosis); and CHF (congestive heart failure). Today patient came to the hospital with pressure like feeling in the left side chest which started last evening. Patient says that she also had sudden onset headache along with diaphoresis. The headache was mainly in the left side. Patient says that she felt dizzy and experienced on observation. Patient has had similar episodes of headache in the past. In the ED CT angiogram of the head and neck was done which was negative.  Allergies:   Allergies  Allergen Reactions  . Ceftin Other (See Comments)    Unknown  . Codeine Nausea And Vomiting  . Morphine Hives and Itching  . Adhesive [Tape] Rash  . Latex Rash      Past Medical History  Diagnosis Date  . IBS (irritable bowel syndrome)     Mild hematochezia; negative for celiac disease; mild Schatzki's ring; small hiatal hernia; minor gastric erosions; few diverticula  . Seizure disorder   . Depression   . Chronic back pain   . Chest pain     Dyspnea, diaphoresis  . Fibromyalgia   . Overweight(278.02)     Negative sleep study-2010, but treated for narcolepsy  . Surgical menopause 2002    age 89  . Degenerative joint disease     Also scoliosis and pectus deformity  . MS (multiple sclerosis)   . CHF (congestive heart failure)     Past Surgical History  Procedure Laterality Date  . Tubal ligation    . Cholecystectomy    . Colonoscopy  2001, 2010    negative/friable anal canal scattered pancolonic diverticula  . Vaginal hysterectomy  1996  . Salpingoophorectomy  2002    Bilateral  . Rhinoplasty      Deviated septum  . Esophagogastroduodenoscopy  11/22/08   noncritical Schatzi ring/small hiatal herina/tiny antral reosions  . Brain surgery      Prior to Admission medications   Medication Sig Start Date End Date Taking? Authorizing Provider  ADDERALL XR 30 MG 24 hr capsule Take 30 mg by mouth every morning. 03/08/15  Yes Historical Provider, MD  albuterol (PROVENTIL HFA;VENTOLIN HFA) 108 (90 BASE) MCG/ACT inhaler Inhale 2 puffs into the lungs every 6 (six) hours as needed for wheezing or shortness of breath.    Yes Historical Provider, MD  amphetamine-dextroamphetamine (ADDERALL) 30 MG tablet Take 30 mg by mouth 2 (two) times daily.   Yes Historical Provider, MD  cyanocobalamin (,VITAMIN B-12,) 1000 MCG/ML injection INJECT ONE ML INTRAMUSCULARLY TWICE A MONTH 04/12/15  Yes Historical Provider, MD  diazepam (VALIUM) 10 MG tablet Take 10 mg by mouth at bedtime.   Yes Historical Provider, MD  oxyCODONE (OXY IR/ROXICODONE) 5 MG immediate release tablet Take 5 mg by mouth every 4 (four) hours as needed for severe pain.   Yes Historical Provider, MD  eszopiclone (LUNESTA) 2 MG TABS tablet Take 2 mg by mouth at bedtime as needed for sleep.  04/12/15   Historical Provider, MD  furosemide (LASIX) 40 MG tablet Take 40 mg by mouth as needed for fluid.     Historical Provider, MD    Social History:  reports that she has never smoked. She has never used smokeless tobacco. She reports that she does  not drink alcohol or use illicit drugs.  Family History  Problem Relation Age of Onset  . Depression Mother     suicide  . Alcohol abuse Father   . Coronary artery disease Father     Ceasar Mons Weights   04/19/15 2150 04/20/15 0201  Weight: 73.483 kg (162 lb) 77.202 kg (170 lb 3.2 oz)    All the positives are listed in BOLD  Review of Systems:  HEENT: Headache, blurred vision, runny nose, sore throat Neck: Hypothyroidism, hyperthyroidism,,lymphadenopathy Chest : Shortness of breath, history of COPD, Asthma Heart : Chest pain, history of coronary arterey  disease GI:  Nausea, vomiting, diarrhea, constipation, GERD GU: Dysuria, urgency, frequency of urination, hematuria. Patient complains of intermittent vaginal bleeding, no bleeding at this time Neuro: Stroke, seizures, syncope Psych: Depression, anxiety, hallucinations   Physical Exam: Blood pressure 133/81, pulse 61, temperature 98.2 F (36.8 C), temperature source Oral, resp. rate 18, height 5' 8.5" (1.74 m), weight 77.202 kg (170 lb 3.2 oz), last menstrual period 09/16/1994, SpO2 100 %. Constitutional:   Patient is a well-developed and well-nourished female* in no acute distress and cooperative with exam. Head: Normocephalic and atraumatic Mouth: Mucus membranes moist Eyes: PERRL, EOMI, conjunctivae normal Neck: Supple, No Thyromegaly Cardiovascular: RRR, S1 normal, S2 normal Pulmonary/Chest: CTAB, no wheezes, rales, or rhonchi Abdominal: Soft. Non-tender, non-distended, bowel sounds are normal, no masses, organomegaly, or guarding present.  Neurological: A&O x3, Strength is normal and symmetric bilaterally, cranial nerve II-XII are grossly intact, no focal motor deficit, sensory intact to light touch bilaterally.  Extremities : No Cyanosis, Clubbing or Edema  Labs on Admission:  Basic Metabolic Panel:  Recent Labs Lab 04/19/15 2231  NA 139  K 3.9  CL 101  CO2 28  GLUCOSE 113*  BUN 8  CREATININE 1.07*  CALCIUM 9.4   Liver Function Tests: No results for input(s): AST, ALT, ALKPHOS, BILITOT, PROT, ALBUMIN in the last 168 hours. No results for input(s): LIPASE, AMYLASE in the last 168 hours. No results for input(s): AMMONIA in the last 168 hours. CBC:  Recent Labs Lab 04/19/15 2231  WBC 10.4  NEUTROABS 6.9  HGB 14.1  HCT 41.5  MCV 90.0  PLT 334   Cardiac Enzymes:  Recent Labs Lab 04/19/15 2231  TROPONINI <0.03    BNP (last 3 results)  Recent Labs  04/19/15 2231  BNP 15.0    ProBNP (last 3 results) No results for input(s): PROBNP in the last 8760  hours.  CBG: No results for input(s): GLUCAP in the last 168 hours.  Radiological Exams on Admission: Ct Angio Head W/cm &/or Wo Cm  04/20/2015   CLINICAL DATA:  Acute onset headache 5 hours ago with diaphoresis, dizziness, nausea, syncope. History of multiple sclerosis, seizure.  EXAM: CT ANGIOGRAPHY HEAD AND NECK  TECHNIQUE: Multidetector CT imaging of the head and neck was performed using the standard protocol during bolus administration of intravenous contrast. Multiplanar CT image reconstructions and MIPs were obtained to evaluate the vascular anatomy. Carotid stenosis measurements (when applicable) are obtained utilizing NASCET criteria, using the distal internal carotid diameter as the denominator.  CONTRAST:  OMNIPAQUE IOHEXOL 350 MG/ML SOLN  COMPARISON:  CT head Feb 09, 2006  FINDINGS: CT HEAD  The ventricles and sulci are normal. No intraparenchymal hemorrhage, mass effect nor midline shift. No acute large vascular territory infarcts. No abnormal intracranial enhancement.  No abnormal extra-axial fluid collections. Basal cisterns are patent.  No skull fracture. The included ocular globes and orbital contents  are non-suspicious. The mastoid aircells and included paranasal sinuses are well-aerated. Partially imaged probable FESS.  CTA NECK  Normal appearance of the thoracic arch, normal branch pattern. The origins of the innominate, left Common carotid artery and subclavian artery are widely patent.  Bilateral Common carotid arteries are widely patent, coursing in a straight line fashion. Normal appearance of the carotid bifurcations without hemodynamically significant stenosis by NASCET criteria. Normal appearance of the included internal carotid arteries.  RIGHT vertebral artery is dominant. Normal appearance of the vertebral arteries, which appear widely patent.  No dissection, no pseudoaneurysm. No abnormal luminal irregularity. No contrast extravasation.  Soft tissues are nonsuspicious.  Suboccipital sebaceous. No acute osseous process though bone windows have not been submitted. Pectus excavatum partially imaged.  CTA HEAD  Anterior circulation: Normal appearance of the cervical internal carotid arteries, petrous, cavernous and supra clinoid internal carotid arteries. Widely patent anterior communicating artery. Normal appearance of the anterior and middle cerebral arteries.  Posterior circulation: RIGHT vertebral artery is dominant with normal appearance of the vertebral arteries, vertebrobasilar junction and basilar artery, as well as main branch vessels. Fenestrated proximal basilar artery. Robust LEFT posterior communicating artery contributing to posterior circulation. Normal appearance of the posterior cerebral arteries.  No large vessel occlusion, hemodynamically significant stenosis, dissection, luminal irregularity, contrast extravasation or aneurysm within the anterior nor posterior circulation.  IMPRESSION: CT HEAD: No acute intracranial process; normal noncontrast CT Head.  CTA NECK: Normal CT angiogram of the neck, no acute vascular process.  CTA HEAD: Normal CT angiogram of the head, no acute vascular process.   Electronically Signed   By: Awilda Metro M.D.   On: 04/20/2015 01:11   Dg Chest 2 View  04/19/2015   CLINICAL DATA:  Chest pain radiating to RIGHT shoulder and arm beginning at 4 p.m. today. Shortness of breath, dizziness, weakness, diaphoresis, nausea. Syncopal episode at home. History of CHF, seizures, chest pain.  EXAM: CHEST  2 VIEW  COMPARISON:  Thoracic spine radiograph September 21, 2012  FINDINGS: The cardiac silhouette appears mildly enlarged though, likely artifactual due to pectus excavatum pectus excavatum. No pleural effusion or focal consolidation. No pneumothorax. Surgical clips in the included right abdomen compatible with cholecystectomy.  IMPRESSION: No acute cardiopulmonary process.   Electronically Signed   By: Awilda Metro M.D.   On: 04/19/2015  22:56   Ct Angio Neck W/cm &/or Wo/cm  04/20/2015   CLINICAL DATA:  Acute onset headache 5 hours ago with diaphoresis, dizziness, nausea, syncope. History of multiple sclerosis, seizure.  EXAM: CT ANGIOGRAPHY HEAD AND NECK  TECHNIQUE: Multidetector CT imaging of the head and neck was performed using the standard protocol during bolus administration of intravenous contrast. Multiplanar CT image reconstructions and MIPs were obtained to evaluate the vascular anatomy. Carotid stenosis measurements (when applicable) are obtained utilizing NASCET criteria, using the distal internal carotid diameter as the denominator.  CONTRAST:  OMNIPAQUE IOHEXOL 350 MG/ML SOLN  COMPARISON:  CT head Feb 09, 2006  FINDINGS: CT HEAD  The ventricles and sulci are normal. No intraparenchymal hemorrhage, mass effect nor midline shift. No acute large vascular territory infarcts. No abnormal intracranial enhancement.  No abnormal extra-axial fluid collections. Basal cisterns are patent.  No skull fracture. The included ocular globes and orbital contents are non-suspicious. The mastoid aircells and included paranasal sinuses are well-aerated. Partially imaged probable FESS.  CTA NECK  Normal appearance of the thoracic arch, normal branch pattern. The origins of the innominate, left Common carotid artery and subclavian  artery are widely patent.  Bilateral Common carotid arteries are widely patent, coursing in a straight line fashion. Normal appearance of the carotid bifurcations without hemodynamically significant stenosis by NASCET criteria. Normal appearance of the included internal carotid arteries.  RIGHT vertebral artery is dominant. Normal appearance of the vertebral arteries, which appear widely patent.  No dissection, no pseudoaneurysm. No abnormal luminal irregularity. No contrast extravasation.  Soft tissues are nonsuspicious. Suboccipital sebaceous. No acute osseous process though bone windows have not been submitted. Pectus  excavatum partially imaged.  CTA HEAD  Anterior circulation: Normal appearance of the cervical internal carotid arteries, petrous, cavernous and supra clinoid internal carotid arteries. Widely patent anterior communicating artery. Normal appearance of the anterior and middle cerebral arteries.  Posterior circulation: RIGHT vertebral artery is dominant with normal appearance of the vertebral arteries, vertebrobasilar junction and basilar artery, as well as main branch vessels. Fenestrated proximal basilar artery. Robust LEFT posterior communicating artery contributing to posterior circulation. Normal appearance of the posterior cerebral arteries.  No large vessel occlusion, hemodynamically significant stenosis, dissection, luminal irregularity, contrast extravasation or aneurysm within the anterior nor posterior circulation.  IMPRESSION: CT HEAD: No acute intracranial process; normal noncontrast CT Head.  CTA NECK: Normal CT angiogram of the neck, no acute vascular process.  CTA HEAD: Normal CT angiogram of the head, no acute vascular process.   Electronically Signed   By: Awilda Metro M.D.   On: 04/20/2015 01:11    EKG: Independently reviewed. Sinus rhythm   Assessment/Plan Active Problems:   Depression   Chronic back pain   Congenital pectus excavatum   Chest pain  Chest pain Will admit the patient in telemetry, cycle the cardiac enzymes. Chest pain has resolved at this time  Headache ? Cluster headache, patient has been followed by a neurologist as outpatient. CTA of head and neck was negative Headache is resolved at this time  Intermittent vaginal bleeding Patient says that she gets intermittent vaginal bleeding, she had hysterectomy in the past Hemoglobin is 14.1 Patient recommended to follow up with GYN as outpatient  Code status: Full code  Family discussion: Admission, patients condition and plan of care including tests being ordered have been discussed with the patient and  her daughter at bedside* who indicate understanding and agree with the plan and Code Status.   Time Spent on Admission: 60  min    Shemeika Starzyk S [facility] Pager: [phone] 04/20/2015, 3:02 AM  If 7PM-7AM, please contact night-coverage  [email or web address]  Password [email or web address]

## 2015-04-28 ENCOUNTER — Encounter: Payer: Medicaid - Out of State | Admitting: Cardiovascular Disease

## 2015-04-28 ENCOUNTER — Encounter: Payer: Self-pay | Admitting: Cardiovascular Disease

## 2015-04-28 ENCOUNTER — Ambulatory Visit (INDEPENDENT_AMBULATORY_CARE_PROVIDER_SITE_OTHER): Payer: Medicaid Other | Admitting: Cardiovascular Disease

## 2015-04-28 VITALS — BP 120/74 | HR 81 | Ht 68.0 in | Wt 171.6 lb

## 2015-04-28 DIAGNOSIS — Q676 Pectus excavatum: Secondary | ICD-10-CM | POA: Diagnosis not present

## 2015-04-28 DIAGNOSIS — Z9289 Personal history of other medical treatment: Secondary | ICD-10-CM

## 2015-04-28 DIAGNOSIS — Z87898 Personal history of other specified conditions: Secondary | ICD-10-CM

## 2015-04-28 DIAGNOSIS — R5383 Other fatigue: Secondary | ICD-10-CM

## 2015-04-28 DIAGNOSIS — R0609 Other forms of dyspnea: Secondary | ICD-10-CM

## 2015-04-28 DIAGNOSIS — R079 Chest pain, unspecified: Secondary | ICD-10-CM

## 2015-04-28 NOTE — Progress Notes (Signed)
Patient ID: Colleen Sellers, female   DOB: 04/13/1966, 49 y.o.   MRN: [id number]       CARDIOLOGY CONSULT NOTE  Patient ID: Colleen Sellers MRN: [id number] DOB/AGE: 10-28-1965 49 y.o.  Admit date: (Not on file) Primary Physician HAWKINS,EDWARD L, MD  Reason for Consultation: chest pain  HPI: 49 yr old woman referred for the evaluation of left-sided chest pain. Has h/o anxiety and depression as well as congenital pectus excavatum. Underwent normal stress echocardiogram in 06/2011. Recently hospitalized for chest pain and ruled out for acute coronary syndrome.  ECG and chest xray were unremarkable. She also has chronic back pain and fibromyalgia. Ultimately, it was deemed either musculoskeletal or anxiety-related (symptoms reportedly reproducible with palpation).  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 Has continued chest pain and shortness of breath. Also c/o "chest swelling" during these episodes.  Says she gained 13 lbs of fluid in 24 hours.  Says she has thyroid disease but is not on medication.  Sees a neurologist in [place], [place].  During hospitalization, BNP 15.  Fam: Significant for daughter and other family members with pectus excavatum.   Allergies  Allergen Reactions  . Ceftin Other (See Comments)    Unknown  . Codeine Nausea And Vomiting  . Morphine Hives and Itching  . Adhesive [Tape] Rash  . Latex Rash    Current Outpatient Prescriptions  Medication Sig Dispense Refill  . ADDERALL XR 30 MG 24 hr capsule Take 30 mg by mouth every morning.  0  . cyanocobalamin (,VITAMIN B-12,) 1000 MCG/ML injection INJECT ONE ML INTRAMUSCULARLY TWICE A MONTH  2  . diazepam (VALIUM) 10 MG tablet Take 10 mg by mouth at bedtime.    Marland Kitchen oxyCODONE (OXY IR/ROXICODONE) 5 MG immediate  release tablet Take 5 mg by mouth every 4 (four) hours as needed for severe pain.     No current facility-administered medications for this visit.    Past Medical History  Diagnosis Date  . IBS (irritable bowel syndrome)     Mild hematochezia; negative for celiac disease; mild Schatzki's ring; small hiatal hernia; minor gastric erosions; few diverticula  . Seizure disorder   . Depression   . Chronic back pain   . Chest pain     Dyspnea, diaphoresis  . Fibromyalgia   . Overweight(278.02)     Negative sleep study-2010, but treated for narcolepsy  . Surgical menopause 2002    age 9  . Degenerative joint disease     Also scoliosis and pectus deformity  . MS (multiple sclerosis)   . CHF (congestive heart failure)     Past Surgical History  Procedure Laterality Date  . Tubal ligation    . Cholecystectomy    . Colonoscopy  2001, 2010    negative/friable anal canal scattered pancolonic diverticula  . Vaginal hysterectomy  1996  . Salpingoophorectomy  2002    Bilateral  . Rhinoplasty      Deviated septum  . Esophagogastroduodenoscopy  11/22/08    noncritical Schatzi ring/small hiatal herina/tiny antral reosions  . Brain surgery      Social History   Social History  . Marital Status: Divorced    Spouse Name: N/A  . Number of Children: 4  . Years of Education: N/A   Occupational History  . Disabled  Fibromyalgia, arthritis   Social History Main Topics  . Smoking status: Never Smoker   . Smokeless tobacco: Never Used  . Alcohol Use: No     Comment: minimal use  . Drug Use: No  . Sexual Activity: Not on file   Other Topics Concern  . Not on file   Social History Narrative       Prior to Admission medications   Medication Sig Start Date End Date Taking? Authorizing Provider  ADDERALL XR 30 MG 24 hr capsule Take 30 mg by mouth every morning. 03/08/15   Historical Provider, MD  cyanocobalamin (,VITAMIN B-12,) 1000 MCG/ML injection INJECT ONE ML  INTRAMUSCULARLY TWICE A MONTH 04/12/15   Historical Provider, MD  diazepam (VALIUM) 10 MG tablet Take 10 mg by mouth at bedtime.    Historical Provider, MD  eszopiclone (LUNESTA) 2 MG TABS tablet Take 2 mg by mouth at bedtime as needed for sleep.  04/12/15   Historical Provider, MD  oxyCODONE (OXY IR/ROXICODONE) 5 MG immediate release tablet Take 5 mg by mouth every 4 (four) hours as needed for severe pain.    Historical Provider, MD     Review of systems complete and found to be negative unless listed above in HPI     Physical exam Blood pressure 120/74, pulse 81, height  (1.727 m), weight 171 lb 9.6 oz (77.837 kg), last menstrual period 09/16/1994, SpO2 98 %. General: NAD Neck: No JVD, no thyromegaly or thyroid nodule.  Lungs: Clear to auscultation bilaterally with normal respiratory effort. CV: Pectus excavatum. Regular rate and rhythm, normal S1/S2, no S3/S4, no murmur.  No peripheral edema.  No carotid bruit.  Normal pedal pulses.  Abdomen: Soft, nontender, no hepatosplenomegaly, no distention.  Skin: Intact without lesions or rashes.  Neurologic: Alert and oriented x 3.  Psych: Normal affect. Extremities: No clubbing or cyanosis.  HEENT: Normal.   ECG: Most recent ECG reviewed.  Labs:   Lab Results  Component Value Date   WBC 8.6 04/20/2015   HGB 13.8 04/20/2015   HCT 40.4 04/20/2015   MCV 89.4 04/20/2015   PLT 355 04/20/2015   No results for input(s): NA, K, CL, CO2, BUN, CREATININE, CALCIUM, PROT, BILITOT, ALKPHOS, ALT, AST, GLUCOSE in the last 168 hours.  Invalid input(s): LABALBU Lab Results  Component Value Date   TROPONINI <0.03 04/20/2015   No results found for: CHOL No results found for: HDL No results found for: LDLCALC No results found for: TRIG No results found for: CHOLHDL No results found for: LDLDIRECT       Studies: No results found.  ASSESSMENT AND PLAN:  1. Chest pain with exertional dyspnea and fatigue: Both atypical and typical  symptoms for ischemic heart disease but given underlying anxiety/depression and fibromyalgia, difficult to say with certainty. Will obtain an echocardiogram and exercise treadmill stress test for further clarification.  Dispo: f/u 1 month.   Signed: Prentice Docker, M.D., F.A.C.C.  04/28/2015, 2:13 PM

## 2015-04-28 NOTE — Patient Instructions (Addendum)
Your physician recommends that you schedule a follow-up appointment in: 1 Month with Gladis Riffle  Your physician has requested that you have an echocardiogram. Echocardiography is a painless test that uses sound waves to create images of your heart. It provides your doctor with information about the size and shape of your heart and how well your heart's chambers and valves are working. This procedure takes approximately one hour. There are no restrictions for this procedure.  Your physician has requested that you have an exercise tolerance test. For further information please visit [email or web address] Please also follow instruction sheet, as given.  Your physician recommends that you continue on your current medications as directed. Please refer to the Current Medication list given to you today.   Thanks for choosing [facility]!!!

## 2015-05-05 ENCOUNTER — Ambulatory Visit (HOSPITAL_COMMUNITY): Admission: RE | Admit: 2015-05-05 | Payer: Medicaid - Out of State | Source: Ambulatory Visit

## 2015-05-05 ENCOUNTER — Ambulatory Visit (HOSPITAL_COMMUNITY): Payer: Medicaid - Out of State

## 2015-05-28 NOTE — Progress Notes (Signed)
Cardiology Office Note   Date:  05/28/2015   ERROR NO SHOW

## 2015-05-29 ENCOUNTER — Encounter: Payer: Medicaid - Out of State | Admitting: Adult Health

## 2015-05-29 ENCOUNTER — Encounter: Payer: Self-pay | Admitting: Adult Health

## 2015-07-19 ENCOUNTER — Ambulatory Visit: Payer: Self-pay | Admitting: Pediatrics

## 2015-07-19 ENCOUNTER — Other Ambulatory Visit: Payer: Self-pay | Admitting: Pediatrics

## 2015-07-20 ENCOUNTER — Ambulatory Visit (INDEPENDENT_AMBULATORY_CARE_PROVIDER_SITE_OTHER): Payer: Medicaid Other | Admitting: Pediatrics

## 2015-07-20 ENCOUNTER — Encounter: Payer: Self-pay | Admitting: Pediatrics

## 2015-07-20 VITALS — BP 123/86 | HR 91 | Temp 98.8°F | Ht 68.0 in | Wt 169.6 lb

## 2015-07-20 DIAGNOSIS — E785 Hyperlipidemia, unspecified: Secondary | ICD-10-CM

## 2015-07-20 DIAGNOSIS — G4700 Insomnia, unspecified: Secondary | ICD-10-CM | POA: Diagnosis not present

## 2015-07-20 DIAGNOSIS — Z8673 Personal history of transient ischemic attack (TIA), and cerebral infarction without residual deficits: Secondary | ICD-10-CM | POA: Diagnosis not present

## 2015-07-20 DIAGNOSIS — F329 Major depressive disorder, single episode, unspecified: Secondary | ICD-10-CM

## 2015-07-20 DIAGNOSIS — G40909 Epilepsy, unspecified, not intractable, without status epilepticus: Secondary | ICD-10-CM

## 2015-07-20 DIAGNOSIS — E538 Deficiency of other specified B group vitamins: Secondary | ICD-10-CM

## 2015-07-20 DIAGNOSIS — R079 Chest pain, unspecified: Secondary | ICD-10-CM | POA: Diagnosis not present

## 2015-07-20 DIAGNOSIS — G35 Multiple sclerosis: Secondary | ICD-10-CM | POA: Diagnosis not present

## 2015-07-20 DIAGNOSIS — F32A Depression, unspecified: Secondary | ICD-10-CM

## 2015-07-20 DIAGNOSIS — R41840 Attention and concentration deficit: Secondary | ICD-10-CM

## 2015-07-20 MED ORDER — ASPIRIN EC 81 MG PO TBEC: 81.0000 mg | DELAYED_RELEASE_TABLET | Freq: Every day | ORAL | Status: DC
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
## 2015-07-20 NOTE — Patient Instructions (Signed)
Start venlafaxine.

## 2015-07-21 LAB — LIPID PANEL
CHOL/HDL RATIO: 3 ratio (ref 0.0–4.4)
CHOLESTEROL TOTAL: 207 mg/dL — AB (ref 100–199)
HDL: 70 mg/dL (ref >39)
LDL Calculated: 126 mg/dL — ABNORMAL HIGH (ref 0–99)
TRIGLYCERIDES: 53 mg/dL (ref 0–149)
VLDL Cholesterol Cal: 11 mg/dL (ref 5–40)

## 2015-07-21 LAB — VITAMIN B12: Vitamin B-12: 1186 pg/mL — ABNORMAL HIGH (ref 211–946)

## 2015-07-23 DIAGNOSIS — Z8673 Personal history of transient ischemic attack (TIA), and cerebral infarction without residual deficits: Secondary | ICD-10-CM | POA: Insufficient documentation

## 2015-07-23 DIAGNOSIS — E785 Hyperlipidemia, unspecified: Secondary | ICD-10-CM | POA: Insufficient documentation

## 2015-07-23 DIAGNOSIS — R41840 Attention and concentration deficit: Secondary | ICD-10-CM | POA: Insufficient documentation

## 2015-07-23 DIAGNOSIS — E538 Deficiency of other specified B group vitamins: Secondary | ICD-10-CM | POA: Insufficient documentation

## 2015-07-23 DIAGNOSIS — G4700 Insomnia, unspecified: Secondary | ICD-10-CM | POA: Insufficient documentation

## 2015-08-04 ENCOUNTER — Encounter: Payer: Self-pay | Admitting: Pediatrics

## 2015-08-04 ENCOUNTER — Ambulatory Visit (INDEPENDENT_AMBULATORY_CARE_PROVIDER_SITE_OTHER): Payer: Medicaid Other | Admitting: Pediatrics

## 2015-08-04 VITALS — BP 122/90 | HR 99 | Temp 97.4°F | Ht 68.0 in | Wt 171.8 lb

## 2015-08-04 DIAGNOSIS — L989 Disorder of the skin and subcutaneous tissue, unspecified: Secondary | ICD-10-CM

## 2015-08-04 DIAGNOSIS — F32A Depression, unspecified: Secondary | ICD-10-CM

## 2015-08-04 DIAGNOSIS — Z124 Encounter for screening for malignant neoplasm of cervix: Secondary | ICD-10-CM

## 2015-08-04 DIAGNOSIS — F329 Major depressive disorder, single episode, unspecified: Secondary | ICD-10-CM | POA: Diagnosis not present
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
## 2015-08-10 LAB — PAP IG AND HPV HIGH-RISK
HPV, high-risk: NEGATIVE
PAP Smear Comment: 0

## 2015-08-11 ENCOUNTER — Encounter: Payer: Self-pay | Admitting: *Deleted

## 2015-09-05 ENCOUNTER — Telehealth: Payer: Self-pay | Admitting: Pediatrics

## 2015-09-05 DIAGNOSIS — G35 Multiple sclerosis: Secondary | ICD-10-CM

## 2015-09-05 DIAGNOSIS — R569 Unspecified convulsions: Secondary | ICD-10-CM

## 2015-09-05 NOTE — Telephone Encounter (Signed)
Pt needs updated referral to [facility] Okayed per Dr Oswaldo Done Faxed to number given

## 2015-09-29 ENCOUNTER — Ambulatory Visit: Payer: Medicaid Other | Admitting: Pediatrics

## 2015-10-02 ENCOUNTER — Encounter: Payer: Self-pay | Admitting: Pediatrics

## 2015-10-05 ENCOUNTER — Ambulatory Visit: Payer: Medicaid Other | Admitting: Pediatrics

## 2015-10-06 ENCOUNTER — Encounter: Payer: Self-pay | Admitting: Pediatrics

## 2015-10-19 ENCOUNTER — Ambulatory Visit (INDEPENDENT_AMBULATORY_CARE_PROVIDER_SITE_OTHER): Payer: Medicaid Other | Admitting: Nurse Practitioner

## 2015-10-19 VITALS — BP 141/86 | HR 72 | Temp 96.7°F | Ht 68.0 in | Wt 186.0 lb

## 2015-10-19 DIAGNOSIS — J0111 Acute recurrent frontal sinusitis: Secondary | ICD-10-CM

## 2015-10-19 DIAGNOSIS — J209 Acute bronchitis, unspecified: Secondary | ICD-10-CM

## 2015-10-19 MED ORDER — ALBUTEROL SULFATE HFA 108 (90 BASE) MCG/ACT IN AERS: 2.0000 | INHALATION_SPRAY | Freq: Four times a day (QID) | RESPIRATORY_TRACT | Status: DC | PRN

## 2015-10-19 MED ORDER — FLUCONAZOLE 150 MG PO TABS
150.0000 mg | ORAL_TABLET | Freq: Once | ORAL | Status: DC
Start: 2015-10-19 — End: 2016-08-26

## 2015-10-19 MED ORDER — AMOXICILLIN-POT CLAVULANATE 875-125 MG PO TABS: 1.0000 | ORAL_TABLET | Freq: Two times a day (BID) | ORAL | Status: DC

## 2015-10-19 NOTE — Progress Notes (Signed)
Subjective:    Patient ID: Colleen Sellers, female    DOB: 1965-10-27, 50 y.o.   MRN: [id number]  HPI Patient in today c/o URI symptoms- started 2 weeks ago with cold chills and fever- coughing  Until she would throw up. Has gotten no better- Is very tired. SLight SOB and hard to take a deep breath. Cough is worse at night- Has used OTC cold meds.    Review of Systems  Constitutional: Positive for fever and chills.  HENT: Positive for congestion and sinus pressure. Negative for ear discharge.   Respiratory: Positive for cough and shortness of breath.   Cardiovascular: Negative.   Gastrointestinal: Negative.   Genitourinary: Negative.   Neurological: Negative.   Psychiatric/Behavioral: Negative.   All other systems reviewed and are negative.      Objective:   Physical Exam  Constitutional: She is oriented to person, place, and time. She appears well-developed and well-nourished.  HENT:  Right Ear: Hearing, external ear and ear canal normal. A middle ear effusion is present.  Left Ear: Hearing, external ear and ear canal normal. A middle ear effusion is present.  Nose: Mucosal edema and rhinorrhea present. Right sinus exhibits frontal sinus tenderness. Right sinus exhibits no maxillary sinus tenderness. Left sinus exhibits frontal sinus tenderness. Left sinus exhibits no maxillary sinus tenderness.  Mouth/Throat: Uvula is midline, oropharynx is clear and moist and mucous membranes are normal.  Eyes: Pupils are equal, round, and reactive to light.  Neck: Normal range of motion.  Cardiovascular: Normal rate, regular rhythm and normal heart sounds.   Pulmonary/Chest: Effort normal. She has wheezes (exp wheezes bil).  Neurological: She is alert and oriented to person, place, and time.  Skin: Skin is warm.  Psychiatric: She has a normal mood and affect. Her behavior is normal. Judgment and thought content normal.    BP 141/86 mmHg  Pulse 72  Temp(Src) 96.7 F (35.9 C) (Oral)  Ht 5'  8" (1.727 m)  Wt 186 lb (84.369 kg)  BMI 28.29 kg/m2  LMP 09/16/1994       Assessment & Plan:   1. Acute recurrent frontal sinusitis   2. Acute bronchitis, unspecified organism    Meds ordered this encounter  Medications  . amoxicillin-clavulanate (AUGMENTIN) 875-125 MG tablet    Sig: Take 1 tablet by mouth 2 (two) times daily.    Dispense:  20 tablet    Refill:  0    Order Specific Question:  Supervising Provider    Answer:  Ernestina Penna [[id number]]  . fluconazole (DIFLUCAN) 150 MG tablet    Sig: Take 1 tablet (150 mg total) by mouth once.    Dispense:  1 tablet    Refill:  0    Order Specific Question:  Supervising Provider    Answer:  Ernestina Penna [[id number]]  . albuterol (PROVENTIL HFA;VENTOLIN HFA) 108 (90 Base) MCG/ACT inhaler    Sig: Inhale 2 puffs into the lungs every 6 (six) hours as needed for wheezing or shortness of breath.    Dispense:  1 Inhaler    Refill:  0    Order Specific Question:  Supervising Provider    Answer:  Ernestina Penna [[id number]]   1. Take meds as prescribed 2. Use a cool mist humidifier especially during the winter months and when heat has been humid. 3. Use saline nose sprays frequently 4. Saline irrigations of the nose can be very helpful if done frequently.  * 4X daily for 1 week*  *  Use of a nettie pot can be helpful with this. Follow directions with this* 5. Drink plenty of fluids 6. Keep thermostat turn down low 7.For any cough or congestion  Use plain Mucinex- regular strength or max strength is fine   * Children- consult with Pharmacist for dosing 8. For fever or aces or pains- take tylenol or ibuprofen appropriate for age and weight.  * for fevers greater than 101 orally you may alternate ibuprofen and tylenol every  3 hours.    Colleen Daphine Deutscher, FNP

## 2015-10-19 NOTE — Patient Instructions (Signed)

## 2016-04-10 ENCOUNTER — Encounter: Payer: Self-pay | Admitting: Pediatrics

## 2016-08-26 ENCOUNTER — Ambulatory Visit (INDEPENDENT_AMBULATORY_CARE_PROVIDER_SITE_OTHER): Payer: Medicaid Other | Admitting: Family

## 2016-08-26 ENCOUNTER — Encounter: Payer: Self-pay | Admitting: Family

## 2016-08-26 VITALS — BP 154/86 | HR 80 | Temp 97.7°F | Ht 68.0 in | Wt 194.2 lb

## 2016-08-26 DIAGNOSIS — J209 Acute bronchitis, unspecified: Secondary | ICD-10-CM | POA: Diagnosis not present

## 2016-08-26 DIAGNOSIS — J0110 Acute frontal sinusitis, unspecified: Secondary | ICD-10-CM | POA: Diagnosis not present

## 2016-08-26 MED ORDER — FLUTICASONE PROPIONATE 50 MCG/ACT NA SUSP: 2.0000 | Freq: Every day | NASAL | 6 refills | Status: DC

## 2016-08-26 MED ORDER — AMOXICILLIN-POT CLAVULANATE 875-125 MG PO TABS: 1.0000 | ORAL_TABLET | Freq: Two times a day (BID) | ORAL | 0 refills | Status: DC

## 2016-08-26 MED ORDER — ALBUTEROL SULFATE HFA 108 (90 BASE) MCG/ACT IN AERS: 2.0000 | INHALATION_SPRAY | Freq: Four times a day (QID) | RESPIRATORY_TRACT | 0 refills | Status: DC | PRN

## 2016-08-26 MED ORDER — FLUCONAZOLE 150 MG PO TABS: 150.0000 mg | ORAL_TABLET | Freq: Once | ORAL | 0 refills | Status: AC

## 2016-08-26 NOTE — Progress Notes (Signed)
   Subjective:    Patient ID: Colleen Sellers, female    DOB: [birth year], 50 y.o.   MRN: [id number]  URI   This is a new problem. The current episode started 1 to 4 weeks ago. The problem has been gradually worsening. Associated symptoms include congestion, coughing, ear pain, headaches, a plugged ear sensation, rhinorrhea, sinus pain, sneezing, a sore throat and wheezing. She has tried NSAIDs, decongestant, acetaminophen and increased fluids for the symptoms. The treatment provided mild relief.      Review of Systems  HENT: Positive for congestion, ear pain, rhinorrhea, sinus pain, sneezing and sore throat.   Respiratory: Positive for cough and wheezing.   Neurological: Positive for headaches.  All other systems reviewed and are negative.      Objective:   Physical Exam  Constitutional: She is oriented to person, place, and time. She appears well-developed and well-nourished. No distress.  HENT:  Head: Normocephalic and atraumatic.  Right Ear: External ear normal.  Left Ear: External ear normal.  Nose: Mucosal edema and rhinorrhea present. Right sinus exhibits frontal sinus tenderness. Left sinus exhibits frontal sinus tenderness.  Mouth/Throat: Posterior oropharyngeal erythema present.  Eyes: Pupils are equal, round, and reactive to light.  Neck: Normal range of motion. Neck supple. No thyromegaly present.  Cardiovascular: Normal rate, regular rhythm, normal heart sounds and intact distal pulses.   No murmur heard. Pulmonary/Chest: Effort normal and breath sounds normal. No respiratory distress. She has no wheezes.  Abdominal: Soft. Bowel sounds are normal. She exhibits no distension. There is no tenderness.  Musculoskeletal: Normal range of motion. She exhibits no edema or tenderness.  Neurological: She is alert and oriented to person, place, and time. She has normal reflexes. No cranial nerve deficit.  Skin: Skin is warm and dry.  Psychiatric: She has a normal mood and affect.  Her behavior is normal. Judgment and thought content normal.  Vitals reviewed.     BP (!) 154/86 (BP Location: Left Arm, Patient Position: Sitting, Cuff Size: Normal)   Pulse 80   Temp 97.7 F (36.5 C) (Oral)   Ht 5\' 8"  (1.727 m)   Wt 194 lb 3.2 oz (88.1 kg)   LMP 09/16/1994   BMI 29.53 kg/m      Assessment & Plan:  1. Acute frontal sinusitis, recurrence not specified - Take meds as prescribed - Use a cool mist humidifier  -Use saline nose sprays frequently -Saline irrigations of the nose can be very helpful if done frequently.  * 4X daily for 1 week*  * Use of a nettie pot can be helpful with this. Follow directions with this* -Force fluids -For any cough or congestion  Use plain Mucinex- regular strength or max strength is fine   * Children- consult with Pharmacist for dosing -For fever or aces or pains- take tylenol or ibuprofen appropriate for age and weight.  * for fevers greater than 101 orally you may alternate ibuprofen and tylenol every  3 hours. -Throat lozenges if help -New toothbrush in 3 days - amoxicillin-clavulanate (AUGMENTIN) 875-125 MG tablet; Take 1 tablet by mouth 2 (two) times daily.  Dispense: 14 tablet; Refill: 0  Jannifer Rodney, FNP

## 2016-08-26 NOTE — Patient Instructions (Signed)

## 2016-09-11 ENCOUNTER — Other Ambulatory Visit: Payer: Self-pay | Admitting: Family

## 2016-09-11 DIAGNOSIS — J209 Acute bronchitis, unspecified: Secondary | ICD-10-CM

## 2016-11-07 ENCOUNTER — Ambulatory Visit (INDEPENDENT_AMBULATORY_CARE_PROVIDER_SITE_OTHER): Payer: Medicaid Other | Admitting: Pediatrics

## 2016-11-07 VITALS — BP 131/73 | HR 79 | Temp 98.4°F | Ht 68.0 in | Wt 191.4 lb

## 2016-11-07 DIAGNOSIS — R5383 Other fatigue: Secondary | ICD-10-CM | POA: Diagnosis not present

## 2016-11-07 DIAGNOSIS — F32A Depression, unspecified: Secondary | ICD-10-CM

## 2016-11-07 DIAGNOSIS — R739 Hyperglycemia, unspecified: Secondary | ICD-10-CM

## 2016-11-07 DIAGNOSIS — F329 Major depressive disorder, single episode, unspecified: Secondary | ICD-10-CM | POA: Diagnosis not present

## 2016-11-07 DIAGNOSIS — R0789 Other chest pain: Secondary | ICD-10-CM | POA: Diagnosis not present

## 2016-11-07 LAB — BAYER DCA HB A1C WAIVED: HB A1C (BAYER DCA - WAIVED): 5.7 % (ref <7.0)

## 2016-11-07 MED ORDER — VENLAFAXINE HCL 37.5 MG PO TABS: 37.5000 mg | ORAL_TABLET | Freq: Two times a day (BID) | ORAL | 1 refills | Status: DC

## 2016-11-07 NOTE — Progress Notes (Signed)
Subjective:   Patient ID: Colleen Sellers, female    DOB: December 04, 1965, 51 y.o.   MRN: [id number] CC: Depression; Dizziness; and Bloated  HPI: Colleen Sellers is a 51 y.o. female presenting for Depression; Dizziness; and Bloated  Depression: Daughter died about a month ago unexpectedly Death under investigation Even before her death was having very low energy, depressed mood  Has had a couple of falls Last was 3 weeks ago  When walking up stairs gets SOB Has occasional chest tightness Comes on when she is getting up doing things around the house hasnt been doing much because not much energy Feels like voer the past year has had much worsening tightness Has some abd bloating, sometimes in her legs or hands Swelling comes and goes, usually worst in the morning If on her feet a lot has leg swelling Chest tightness happening 3-4 times a week Lasts for several minutes Goes to lay down propped up on pillows and gets better  Still follows with Dr Jonelle Sidle for MS    Depression screen [facility] 2/9 11/07/2016 08/26/2016 08/04/2015  Decreased Interest 3 0 0  Down, Depressed, Hopeless 3 1 0  PHQ - 2 Score 6 1 0  Altered sleeping 3 - -  Tired, decreased energy 3 - -  Change in appetite 3 - -  Feeling bad or failure about yourself  3 - -  Trouble concentrating 3 - -  Moving slowly or fidgety/restless 1 - -  Suicidal thoughts 0 - -  PHQ-9 Score 22 - -  Difficult doing work/chores Very difficult - -     Relevant past medical, surgical, family and social history reviewed. Allergies and medications reviewed and updated. History  Smoking Status  . Never Smoker  Smokeless Tobacco  . Never Used   ROS: Per HPI   Objective:    BP 131/73   Pulse 79   Temp 98.4 F (36.9 C) (Oral)   Ht '5\' 8"'  (1.727 m)   Wt 191 lb 6.4 oz (86.8 kg)   LMP 09/16/1994   BMI 29.10 kg/m   Wt Readings from Last 3 Encounters:  11/07/16 191 lb 6.4 oz (86.8 kg)  08/26/16 194 lb 3.2 oz (88.1 kg)  10/19/15 186 lb  (84.4 kg)    Gen: NAD, alert, cooperative with exam, NCAT EYES: EOMI, no conjunctival injection, or no icterus ENT:  TMs pearly gray b/l, OP without erythema LYMPH: no cervical LAD CV: NRRR, normal S1/S2, no murmur, distal pulses 2+ b/l Resp: CTABL, no wheezes, normal WOB Abd: +BS, soft, NTND. no guarding or organomegaly Ext: No edema, warm Neuro: Alert and oriented Psych: slightly tearful at times, no thoughts of self harm  Assessment & Plan:  Colleen Sellers was seen today for depression, dizziness and bloated.  Diagnoses and all orders for this visit:  Depression, unspecified depression type Not controlled, feels safe at home Acute grief from recent death of daughter Start venlafaxine, also may help with HA -     CMP14+EGFR -     CBC with Differential/Platelet -     Ambulatory referral to Psychiatry -     venlafaxine (EFFEXOR) 37.5 MG tablet; Take 1 tablet (37.5 mg total) by mouth 2 (two) times daily.  Other fatigue -     TSH  Chest tightness +Orthopnea, SOB with exertion Will refer for stress test -     Brain natriuretic peptide -     Ambulatory referral to Cardiology  Hyperglycemia -     Bayer DCA Hb  A1c Waived  MS Follows with neurology Not on medication now per pt  Follow up plan: Return in about 4 weeks (around 12/05/2016). Assunta Found, MD [place]

## 2016-11-07 NOTE — Patient Instructions (Signed)
Counselor: [email or web address]  Grief share:  [facility] [phone]  I put in referral for psychiatry and cardiology

## 2016-11-08 ENCOUNTER — Encounter: Payer: Self-pay | Admitting: Pediatrics

## 2016-11-08 LAB — CMP14+EGFR
ALT: 16 IU/L (ref 0–32)
AST: 22 IU/L (ref 0–40)
Albumin/Globulin Ratio: 1.7 (ref 1.2–2.2)
Albumin: 4.6 g/dL (ref 3.5–5.5)
Alkaline Phosphatase: 73 IU/L (ref 39–117)
BUN/Creatinine Ratio: 12 (ref 9–23)
BUN: 13 mg/dL (ref 6–24)
Bilirubin Total: <0.2 mg/dL (ref 0.0–1.2)
CALCIUM: 9.8 mg/dL (ref 8.7–10.2)
CO2: 24 mmol/L (ref 18–29)
Chloride: 99 mmol/L (ref 96–106)
Creatinine, Ser: 1.1 mg/dL — ABNORMAL HIGH (ref 0.57–1.00)
GFR calc Af Amer: 67 (ref >59)
GFR, EST NON AFRICAN AMERICAN: 58 — AB (ref >59)
GLUCOSE: 86 mg/dL (ref 65–99)
Globulin, Total: 2.7 (ref 1.5–4.5)
Potassium: 4.6 mmol/L (ref 3.5–5.2)
Sodium: 141 mmol/L (ref 134–144)
TOTAL PROTEIN: 7.3 g/dL (ref 6.0–8.5)

## 2016-11-08 LAB — CBC WITH DIFFERENTIAL/PLATELET
BASOS ABS: 0 10*3/uL (ref 0.0–0.2)
Basos: 0 %
EOS (ABSOLUTE): 0.1 10*3/uL (ref 0.0–0.4)
Eos: 1 %
HEMOGLOBIN: 13.5 g/dL (ref 11.1–15.9)
Hematocrit: 39.9 % (ref 34.0–46.6)
IMMATURE GRANS (ABS): 0 10*3/uL (ref 0.0–0.1)
Immature Granulocytes: 0 %
LYMPHS: 36 %
Lymphocytes Absolute: 2.7 10*3/uL (ref 0.7–3.1)
MCH: 30.4 pg (ref 26.6–33.0)
MCHC: 33.8 g/dL (ref 31.5–35.7)
MCV: 90 fL (ref 79–97)
MONOCYTES: 10 %
Monocytes Absolute: 0.8 10*3/uL (ref 0.1–0.9)
NEUTROS ABS: 4 10*3/uL (ref 1.4–7.0)
NEUTROS PCT: 53 %
PLATELETS: 371 10*3/uL (ref 150–379)
RBC: 4.44 x10E6/uL (ref 3.77–5.28)
RDW: 13.2 % (ref 12.3–15.4)
WBC: 7.7 10*3/uL (ref 3.4–10.8)

## 2016-11-08 LAB — BRAIN NATRIURETIC PEPTIDE: BNP: 16.2 pg/mL (ref 0.0–100.0)

## 2016-11-08 LAB — TSH: TSH: 5.1 u[IU]/mL — ABNORMAL HIGH (ref 0.450–4.500)

## 2016-11-15 ENCOUNTER — Ambulatory Visit: Payer: Medicaid Other | Admitting: Cardiovascular Disease

## 2016-11-18 ENCOUNTER — Telehealth: Payer: Self-pay

## 2016-11-18 NOTE — Telephone Encounter (Signed)
Pt must call to schedule her own appointment  [phone]

## 2016-11-19 ENCOUNTER — Telehealth: Payer: Self-pay

## 2016-11-19 DIAGNOSIS — M5441 Lumbago with sciatica, right side: Secondary | ICD-10-CM

## 2016-11-20 NOTE — Telephone Encounter (Signed)
What does she want the referral for?

## 2016-11-20 NOTE — Telephone Encounter (Signed)
Patient states she needs the referral for right side sciatica pain and also back pain. FYI

## 2016-11-20 NOTE — Telephone Encounter (Signed)
Referral faxed to [facility] and pt aware to be expecting a phone call from them

## 2016-11-20 NOTE — Telephone Encounter (Signed)
ordered

## 2016-11-29 ENCOUNTER — Telehealth: Payer: Self-pay | Admitting: Pediatrics

## 2016-11-29 NOTE — Telephone Encounter (Signed)
Lidocaine patches do not work well for back pain- NTBS to be rx anyway

## 2016-11-29 NOTE — Telephone Encounter (Signed)
What is the name of the medication? Lidocaine patches  Have you contacted your pharmacy to request a refill? No. This is a new request for her back pain until she sees the ortho.  Which pharmacy would you like this sent to? [facility] in [place].   Patient notified that their request is being sent to the clinical staff for review and that they should receive a call once it is complete. If they do not receive a call within 24 hours they can check with their pharmacy or our office.

## 2016-12-06 ENCOUNTER — Ambulatory Visit: Payer: Medicaid Other | Admitting: Pediatrics

## 2016-12-06 ENCOUNTER — Encounter: Payer: Self-pay | Admitting: *Deleted

## 2016-12-06 ENCOUNTER — Encounter: Payer: Self-pay | Admitting: Cardiovascular Disease

## 2016-12-06 ENCOUNTER — Ambulatory Visit (INDEPENDENT_AMBULATORY_CARE_PROVIDER_SITE_OTHER): Payer: Medicaid Other | Admitting: Cardiovascular Disease

## 2016-12-06 VITALS — BP 128/70 | HR 76 | Ht 69.0 in | Wt 195.0 lb

## 2016-12-06 DIAGNOSIS — R5383 Other fatigue: Secondary | ICD-10-CM | POA: Diagnosis not present

## 2016-12-06 DIAGNOSIS — R55 Syncope and collapse: Secondary | ICD-10-CM

## 2016-12-06 DIAGNOSIS — R0609 Other forms of dyspnea: Secondary | ICD-10-CM | POA: Diagnosis not present

## 2016-12-06 DIAGNOSIS — Q676 Pectus excavatum: Secondary | ICD-10-CM | POA: Diagnosis not present

## 2016-12-06 DIAGNOSIS — R0789 Other chest pain: Secondary | ICD-10-CM | POA: Diagnosis not present

## 2016-12-06 DIAGNOSIS — I209 Angina pectoris, unspecified: Secondary | ICD-10-CM | POA: Diagnosis not present

## 2016-12-06 NOTE — Progress Notes (Signed)
SUBJECTIVE: The patient is a 51 year old woman whom I last evaluated for chest pain in August 2016. She has h/o anxiety and depression as well as congenital pectus excavatum. She was supposed to follow up in September 2016 but never did. I had ordered a stress test and echocardiogram at that time but she never pursued it.  She saw her PCP in February and was complaining of chest tightness. BNP was ordered and was found to be normal, 16.2.  She continues to experience similar chest pains since 2016. They are retrosternal in location and radiate to the back and left shoulder blade as well as her jaw. She describes it as a "squeezing "and "stabbing" sensation. It is accompanied by shortness of breath and dizzy spells. She also describes her heart racing and slowing down from time to time. She also describes episodes of syncope, the most recent one occurring one month ago. She got out of her bed and then passed out and crawled back into bed.  Episodes of chest pain last a few minutes at a time.  She is also describes bilateral ankle swelling and bloating in her stomach.  She has never smoked.  She is felt fatigued over the last several months.  Labs 2/22: TSH 5.1 (mild elevation), normal HbA1c 5.7%, normal CBC, creatinine 1.1.  ECG performed in the office today which I personally reviewed demonstrates normal sinus rhythm with no ischemic ST segment or T-wave abnormalities, nor any arrhythmias.   Family history: Father died at the age of 31 of an MI in 2011. He also had peripheral rashes.    Review of Systems: As per "subjective", otherwise negative.  Allergies  Allergen Reactions  . Ceftin Other (See Comments)    Unknown  . Codeine Nausea And Vomiting  . Morphine Hives and Itching  . Adhesive [Tape] Rash  . Latex Rash    Current Outpatient Prescriptions  Medication Sig Dispense Refill  . fluticasone (FLONASE) 50 MCG/ACT nasal spray Place 2 sprays into both nostrils daily. 16  g 6  . oxyCODONE (OXY IR/ROXICODONE) 5 MG immediate release tablet Take 5 mg by mouth every 4 (four) hours as needed for severe pain.    Marland Kitchen PROVENTIL HFA 108 (90 Base) MCG/ACT inhaler INHALE 2 PUFFS INTO THE LUNGS EVERY 6 (SIX) HOURS AS NEEDED FOR WHEEZING OR SHORTNESS OF BREATH. 6.7 Inhaler 2  . aspirin EC 81 MG tablet Take 1 tablet (81 mg total) by mouth daily. (Patient not taking: Reported on 11/07/2016) 30 tablet 3  . B-D 3CC LUER-LOK SYR 25GX1" 25G X 1" 3 ML MISC USE ONE SYRINGE TO INJECT B-12 TWICE A MONTH.  12  . diazepam (VALIUM) 10 MG tablet Take 10 mg by mouth at bedtime.    Marland Kitchen venlafaxine (EFFEXOR) 37.5 MG tablet Take 1 tablet (37.5 mg total) by mouth 2 (two) times daily. (Patient not taking: Reported on 12/06/2016) 60 tablet 1   No current facility-administered medications for this visit.     Past Medical History:  Diagnosis Date  . Chest pain    Dyspnea, diaphoresis  . CHF (congestive heart failure) (HCC)   . Chronic back pain   . Degenerative joint disease    Also scoliosis and pectus deformity  . Depression   . Fibromyalgia   . IBS (irritable bowel syndrome)    Mild hematochezia; negative for celiac disease; mild Schatzki's ring; small hiatal hernia; minor gastric erosions; few diverticula  . MS (multiple sclerosis) (HCC)   . Overweight(278.02)  Negative sleep study-2010, but treated for narcolepsy  . Seizure disorder (HCC)   . Surgical menopause 2002   age 54    Past Surgical History:  Procedure Laterality Date  . BRAIN SURGERY    . CHOLECYSTECTOMY    . COLONOSCOPY  2001, 2010   negative/friable anal canal scattered pancolonic diverticula  . ESOPHAGOGASTRODUODENOSCOPY  11/22/08   noncritical Schatzi ring/small hiatal herina/tiny antral reosions  . RHINOPLASTY     Deviated septum  . SALPINGOOPHORECTOMY  2002   Bilateral  . TUBAL LIGATION    . VAGINAL HYSTERECTOMY  1996    Social History   Social History  . Marital status: Divorced    Spouse name: N/A  .  Number of children: 4  . Years of education: N/A   Occupational History  . Disabled     Fibromyalgia, arthritis   Social History Main Topics  . Smoking status: Never Smoker  . Smokeless tobacco: Never Used  . Alcohol use No     Comment: minimal use  . Drug use: No  . Sexual activity: Not on file   Other Topics Concern  . Not on file   Social History Narrative  . No narrative on file     Vitals:   12/06/16 1421  BP: 128/70  Pulse: 76  SpO2: 98%  Weight: 195 lb (88.5 kg)  Height: 5\' 9"  (1.753 m)    PHYSICAL EXAM General: NAD HEENT: Normal. Neck: No JVD, no thyromegaly. Lungs: Clear to auscultation bilaterally with normal respiratory effort. CV: Nondisplaced PMI.  Regular rate and rhythm, normal S1/S2, no S3/S4, no murmur. No pretibial or periankle edema.  No carotid bruit.   Abdomen: Soft, mild epigastric tenderness, no distention.  Neurologic: Alert and oriented.  Psych: Normal affect. Skin: Normal. Musculoskeletal: No gross deformities.    ECG: Most recent ECG reviewed.      ASSESSMENT AND PLAN: 1. Chest pain and shortness of breath: Typical and atypical symptoms for ischemic heart disease. I will proceed with a nuclear myocardial perfusion imaging study to evaluate for ischemic heart disease (Lexiscan Myoview). I will order a 2-D echocardiogram with Doppler to evaluate cardiac structure, function, and regional wall motion.  2. Syncope: If there is recurrence, I would consider event monitoring.   Dispo: fu 1 month  Time spent: 40 minutes, of which greater than 50% was spent reviewing symptoms, relevant blood tests and studies, and discussing management plan with the patient.   Prentice Docker, M.D., F.A.C.C.

## 2016-12-06 NOTE — Patient Instructions (Signed)
Your physician recommends that you schedule a follow-up appointment in: 1 Month with Dr. Purvis Sheffield  Your physician recommends that you continue on your current medications as directed. Please refer to the Current Medication list given to you today.  Your physician has requested that you have an echocardiogram. Echocardiography is a painless test that uses sound waves to create images of your heart. It provides your doctor with information about the size and shape of your heart and how well your heart's chambers and valves are working. This procedure takes approximately one hour. There are no restrictions for this procedure.  Your physician has requested that you have a lexiscan myoview. For further information please visit [email or web address] Please follow instruction sheet, as given.  If you need a refill on your cardiac medications before your next appointment, please call your pharmacy.  Thank you for choosing  [facility]!

## 2016-12-09 ENCOUNTER — Encounter: Payer: Self-pay | Admitting: Pediatrics

## 2016-12-09 ENCOUNTER — Ambulatory Visit: Payer: Medicaid Other | Admitting: Pediatrics

## 2016-12-10 ENCOUNTER — Encounter: Payer: Self-pay | Admitting: Pediatrics

## 2016-12-12 ENCOUNTER — Ambulatory Visit (HOSPITAL_COMMUNITY): Payer: Medicaid Other

## 2016-12-12 ENCOUNTER — Inpatient Hospital Stay (HOSPITAL_COMMUNITY): Admission: RE | Admit: 2016-12-12 | Payer: Medicaid Other | Source: Ambulatory Visit

## 2016-12-12 ENCOUNTER — Ambulatory Visit (HOSPITAL_COMMUNITY): Payer: Medicaid Other | Attending: Cardiovascular Disease

## 2016-12-16 ENCOUNTER — Ambulatory Visit (INDEPENDENT_AMBULATORY_CARE_PROVIDER_SITE_OTHER): Payer: Medicaid Other | Admitting: Pediatrics

## 2016-12-16 ENCOUNTER — Encounter: Payer: Self-pay | Admitting: Pediatrics

## 2016-12-16 VITALS — BP 133/85 | HR 66 | Temp 97.1°F | Ht 69.0 in | Wt 197.0 lb

## 2016-12-16 DIAGNOSIS — G8929 Other chronic pain: Secondary | ICD-10-CM | POA: Diagnosis not present

## 2016-12-16 DIAGNOSIS — R399 Unspecified symptoms and signs involving the genitourinary system: Secondary | ICD-10-CM

## 2016-12-16 DIAGNOSIS — F329 Major depressive disorder, single episode, unspecified: Secondary | ICD-10-CM

## 2016-12-16 DIAGNOSIS — M5441 Lumbago with sciatica, right side: Secondary | ICD-10-CM | POA: Diagnosis not present

## 2016-12-16 DIAGNOSIS — F32A Depression, unspecified: Secondary | ICD-10-CM

## 2016-12-16 LAB — URINALYSIS, COMPLETE
BILIRUBIN UA: NEGATIVE
GLUCOSE, UA: NEGATIVE
KETONES UA: NEGATIVE
LEUKOCYTES UA: NEGATIVE
Nitrite, UA: NEGATIVE
Protein, UA: NEGATIVE
Specific Gravity, UA: >1.03 — ABNORMAL HIGH (ref 1.005–1.030)
Urobilinogen, Ur: 0.2 mg/dL (ref 0.2–1.0)
pH, UA: 5 (ref 5.0–7.5)

## 2016-12-16 LAB — MICROSCOPIC EXAMINATION
Bacteria, UA: NONE SEEN
Epithelial Cells (non renal): >10 /hpf — AB (ref 0–10)
Renal Epithel, UA: NONE SEEN /hpf

## 2016-12-16 MED ORDER — NAPROXEN 500 MG PO TABS: 500.0000 mg | ORAL_TABLET | Freq: Two times a day (BID) | ORAL | 0 refills | Status: DC

## 2016-12-16 NOTE — Progress Notes (Signed)
  Subjective:   Patient ID: Colleen Sellers, female    DOB: 1966-08-01, 51 y.o.   MRN: [id number] CC: Follow-up multiple med problems HPI: Colleen Sellers is a 51 y.o. female presenting for Follow-up  Depression ongoing Says nothing worse but not much better Took venlafaxine once, made her sleep that night and into the next day so stopped  Sleep going poorly now, tossing and turning some with her back pain which has been ongoing since she fell onto her knees several weeks ago Hurts across her lower back, some sciatic pain down R leg, use to be down L leg  Sometimes having burning with urination Not every void Increasing past week Low back pain ongoing as above, she isnt sure if feels like prior UTIs Sometimes has lower abd pressure No fevers  Depression screen [facility] 2/9 12/16/2016 11/07/2016 08/26/2016 08/04/2015  Decreased Interest 3 3 0 0  Down, Depressed, Hopeless 3 3 1  0  PHQ - 2 Score 6 6 1  0  Altered sleeping 3 3 - -  Tired, decreased energy 3 3 - -  Change in appetite 3 3 - -  Feeling bad or failure about yourself  3 3 - -  Trouble concentrating 3 3 - -  Moving slowly or fidgety/restless 1 1 - -  Suicidal thoughts 0 0 - -  PHQ-9 Score 22 22 - -  Difficult doing work/chores Very difficult Very difficult - -    Relevant past medical, surgical, family and social history reviewed. Allergies and medications reviewed and updated. History  Smoking Status  . Never Smoker  Smokeless Tobacco  . Never Used   ROS: Per HPI   Objective:    BP 133/85   Pulse 66   Temp 97.1 F (36.2 C) (Oral)   Ht 5\' 9"  (1.753 m)   Wt 197 lb (89.4 kg)   LMP 09/16/1994   BMI 29.09 kg/m   Wt Readings from Last 3 Encounters:  12/16/16 197 lb (89.4 kg)  12/06/16 195 lb (88.5 kg)  11/07/16 191 lb 6.4 oz (86.8 kg)    Gen: NAD, alert, cooperative with exam, NCAT EYES: EOMI, no conjunctival injection, or no icterus ENT:  OP without erythema LYMPH: no cervical LAD CV: NRRR, normal S1/S2, no  murmur, distal pulses 2+ b/l Resp: CTABL, no wheezes, normal WOB Abd: +BS, soft, mildly tender with palpation throughout, ND. no guarding or organomegaly, no CVA tenderness Ext: No edema, warm Neuro: Alert and oriented MSK: no point tenderness over spine, sore lower back midline and across to paraspinal muscles  Assessment & Plan:  Colleen Sellers was seen today for follow-up multiple med problems  Diagnoses and all orders for this visit:  Chronic bilateral low back pain with right-sided sciatica Discussed symptom care, can take below, gentle back exercises      naproxen (NAPROSYN) 500 MG tablet; Take 1 tablet (500 mg total) by mouth 2 (two) times daily with a meal.  Depression, unspecified depression type Ongoing symptoms, not controlled Restart venlafaxine, can start with half tab BID  UTI symptoms UA with small amount of hematuria, f/u culture Will need repeat UA next visit -     Urinalysis, Complete -     Urine culture   Follow up plan: Return in about 8 weeks (around 02/10/2017). Rex Kras, MD [facility]

## 2016-12-18 LAB — URINE CULTURE

## 2016-12-18 NOTE — Telephone Encounter (Signed)
Patient had a follow up appointment scheduled. 

## 2017-01-06 ENCOUNTER — Other Ambulatory Visit: Payer: Self-pay | Admitting: Pediatrics

## 2017-01-06 DIAGNOSIS — F329 Major depressive disorder, single episode, unspecified: Secondary | ICD-10-CM

## 2017-01-06 DIAGNOSIS — F32A Depression, unspecified: Secondary | ICD-10-CM

## 2017-01-10 ENCOUNTER — Encounter: Payer: Self-pay | Admitting: Cardiovascular Disease

## 2017-01-10 ENCOUNTER — Ambulatory Visit (INDEPENDENT_AMBULATORY_CARE_PROVIDER_SITE_OTHER): Payer: Medicaid Other | Admitting: Cardiovascular Disease

## 2017-01-10 VITALS — BP 134/94 | HR 86 | Ht 68.0 in | Wt 197.0 lb

## 2017-01-10 DIAGNOSIS — R0789 Other chest pain: Secondary | ICD-10-CM | POA: Diagnosis not present

## 2017-01-10 DIAGNOSIS — Q676 Pectus excavatum: Secondary | ICD-10-CM | POA: Diagnosis not present

## 2017-01-10 DIAGNOSIS — Z9111 Patient's noncompliance with dietary regimen: Secondary | ICD-10-CM | POA: Diagnosis not present

## 2017-01-10 DIAGNOSIS — R5383 Other fatigue: Secondary | ICD-10-CM | POA: Diagnosis not present

## 2017-01-10 DIAGNOSIS — R0609 Other forms of dyspnea: Secondary | ICD-10-CM | POA: Diagnosis not present

## 2017-01-10 DIAGNOSIS — R030 Elevated blood-pressure reading, without diagnosis of hypertension: Secondary | ICD-10-CM | POA: Diagnosis not present

## 2017-01-10 DIAGNOSIS — R55 Syncope and collapse: Secondary | ICD-10-CM | POA: Diagnosis not present

## 2017-01-10 DIAGNOSIS — Z91199 Patient's noncompliance with other medical treatment and regimen due to unspecified reason: Secondary | ICD-10-CM

## 2017-01-10 NOTE — Patient Instructions (Addendum)
Your physician recommends that you schedule a follow-up appointment after test are complete.   Your physician recommends that you continue on your current medications as directed. Please refer to the Current Medication list given to you today.  Your physician has requested that you have an echocardiogram. Echocardiography is a painless test that uses sound waves to create images of your heart. It provides your doctor with information about the size and shape of your heart and how well your heart's chambers and valves are working. This procedure takes approximately one hour. There are no restrictions for this procedure.   Your physician has requested that you have a lexiscan myoview. For further information please visit [email or web address] Please follow instruction sheet, as given.  If you need a refill on your cardiac medications before your next appointment, please call your pharmacy.  Thank you for choosing [facility]!

## 2017-01-10 NOTE — Addendum Note (Signed)
Addended by: Marlyn Corporal A on: 01/10/2017 04:55 PM   Modules accepted: Orders

## 2017-01-10 NOTE — Progress Notes (Signed)
SUBJECTIVE: The patient returns for follow-up after undergoing cardiovascular testing performed for the evaluation of chest pain. I evaluated her for this on 12/06/16 and ordered a nuclear stress test and echocardiogram.  She did not obtain either test. The same thing happened in 2016. She said she did not obtain a stress sestamibi echocardiogram due to dizzy spells that day.  She has chest pain when she gets stressed out and also has exertional dyspnea when walking from the bedroom to the kitchen. She said her heart races when she lies down at night. She has been having falls due to dizziness but denies syncope. She said she has not slept much. She also says her BP has been running high lately.    Review of Systems: As per "subjective", otherwise negative.  Allergies  Allergen Reactions  . Ceftin Other (See Comments)    Unknown  . Codeine Nausea And Vomiting  . Morphine Hives and Itching  . Adhesive [Tape] Rash  . Latex Rash    Current Outpatient Prescriptions  Medication Sig Dispense Refill  . aspirin EC 81 MG tablet Take 1 tablet (81 mg total) by mouth daily. 30 tablet 3  . B-D 3CC LUER-LOK SYR 25GX1" 25G X 1" 3 ML MISC USE ONE SYRINGE TO INJECT B-12 TWICE A MONTH.  12  . fluticasone (FLONASE) 50 MCG/ACT nasal spray Place 2 sprays into both nostrils daily. 16 g 6  . naproxen (NAPROSYN) 500 MG tablet Take 1 tablet (500 mg total) by mouth 2 (two) times daily with a meal. 60 tablet 0  . oxyCODONE (OXY IR/ROXICODONE) 5 MG immediate release tablet Take 5 mg by mouth every 4 (four) hours as needed for severe pain.    Marland Kitchen PROVENTIL HFA 108 (90 Base) MCG/ACT inhaler INHALE 2 PUFFS INTO THE LUNGS EVERY 6 (SIX) HOURS AS NEEDED FOR WHEEZING OR SHORTNESS OF BREATH. 6.7 Inhaler 2   No current facility-administered medications for this visit.     Past Medical History:  Diagnosis Date  . Chest pain    Dyspnea, diaphoresis  . CHF (congestive heart failure) (HCC)   . Chronic back pain    . Degenerative joint disease    Also scoliosis and pectus deformity  . Depression   . Fibromyalgia   . IBS (irritable bowel syndrome)    Mild hematochezia; negative for celiac disease; mild Schatzki's ring; small hiatal hernia; minor gastric erosions; few diverticula  . MS (multiple sclerosis) (HCC)   . Overweight(278.02)    Negative sleep study-2010, but treated for narcolepsy  . Seizure disorder (HCC)   . Surgical menopause 2002   age 1    Past Surgical History:  Procedure Laterality Date  . BRAIN SURGERY    . CHOLECYSTECTOMY    . COLONOSCOPY  2001, 2010   negative/friable anal canal scattered pancolonic diverticula  . ESOPHAGOGASTRODUODENOSCOPY  11/22/08   noncritical Schatzi ring/small hiatal herina/tiny antral reosions  . RHINOPLASTY     Deviated septum  . SALPINGOOPHORECTOMY  2002   Bilateral  . TUBAL LIGATION    . VAGINAL HYSTERECTOMY  1996    Social History   Social History  . Marital status: Divorced    Spouse name: N/A  . Number of children: 4  . Years of education: N/A   Occupational History  . Disabled     Fibromyalgia, arthritis   Social History Main Topics  . Smoking status: Never Smoker  . Smokeless tobacco: Never Used  . Alcohol use No  Comment: minimal use  . Drug use: No  . Sexual activity: Not on file   Other Topics Concern  . Not on file   Social History Narrative  . No narrative on file     Vitals:   01/10/17 1632  BP: (!) 134/94  Pulse: 86  SpO2: 98%  Weight: 197 lb (89.4 kg)  Height: 5\' 8"  (1.727 m)    Wt Readings from Last 3 Encounters:  01/10/17 197 lb (89.4 kg)  12/16/16 197 lb (89.4 kg)  12/06/16 195 lb (88.5 kg)     PHYSICAL EXAM General: NAD HEENT: Normal. Neck: No JVD, no thyromegaly. Lungs: Clear to auscultation bilaterally with normal respiratory effort. CV: Nondisplaced PMI.  Regular rate and rhythm, normal S1/S2, no S3/S4, no murmur. No pretibial or periankle edema.  No carotid bruit.   Abdomen:  Soft, nontender, no distention.  Neurologic: Alert and oriented.  Psych: Normal affect. Skin: Normal. Musculoskeletal: No gross deformities.    ECG: Most recent ECG reviewed.   Labs: Lab Results  Component Value Date/Time   K 4.6 11/07/2016 05:21 PM   BUN 13 11/07/2016 05:21 PM   CREATININE 1.10 (H) 11/07/2016 05:21 PM   ALT 16 11/07/2016 05:21 PM   TSH 5.100 (H) 11/07/2016 05:21 PM   HGB 13.8 04/20/2015 03:35 AM     Lipids: Lab Results  Component Value Date/Time   LDLCALC 126 (H) 07/20/2015 04:21 PM   CHOL 207 (H) 07/20/2015 04:21 PM   TRIG 53 07/20/2015 04:21 PM   HDL 70 07/20/2015 04:21 PM       ASSESSMENT AND PLAN:  1. Chest pain and shortness of breath: Typical and atypical symptoms for ischemic heart disease. I previously ordered a nuclear stress test but she did not obtain it. I also obtain an echocardiogram but she did not obtain this either. I encouraged her to obtain both and then schedule a follow-up appointment with me.  2. Syncope: No recurrences. I would consider event monitoring in the future if they were to happen.  3. Elevated BP: DBP is elevated. She does not carry a diagnosis of hypertension. I will monitor this.    Disposition: Follow up once testing is completed.  Prentice Docker, M.D., F.A.C.C.

## 2017-01-14 DIAGNOSIS — G894 Chronic pain syndrome: Secondary | ICD-10-CM | POA: Diagnosis not present

## 2017-01-14 DIAGNOSIS — M542 Cervicalgia: Secondary | ICD-10-CM | POA: Diagnosis not present

## 2017-01-14 DIAGNOSIS — M5030 Other cervical disc degeneration, unspecified cervical region: Secondary | ICD-10-CM | POA: Diagnosis not present

## 2017-01-14 DIAGNOSIS — M5136 Other intervertebral disc degeneration, lumbar region: Secondary | ICD-10-CM | POA: Diagnosis not present

## 2017-01-20 ENCOUNTER — Other Ambulatory Visit: Payer: Self-pay | Admitting: Pediatrics

## 2017-01-20 ENCOUNTER — Ambulatory Visit (HOSPITAL_COMMUNITY): Admission: RE | Admit: 2017-01-20 | Payer: Medicaid Other | Source: Ambulatory Visit

## 2017-01-20 ENCOUNTER — Encounter (HOSPITAL_COMMUNITY): Payer: Medicaid Other

## 2017-01-20 ENCOUNTER — Ambulatory Visit (HOSPITAL_COMMUNITY): Payer: Medicaid Other

## 2017-01-20 DIAGNOSIS — M5441 Lumbago with sciatica, right side: Principal | ICD-10-CM

## 2017-01-20 DIAGNOSIS — G8929 Other chronic pain: Secondary | ICD-10-CM

## 2017-01-24 DIAGNOSIS — Z1231 Encounter for screening mammogram for malignant neoplasm of breast: Secondary | ICD-10-CM | POA: Diagnosis not present

## 2017-01-26 ENCOUNTER — Other Ambulatory Visit: Payer: Self-pay | Admitting: Pediatrics

## 2017-01-26 DIAGNOSIS — J209 Acute bronchitis, unspecified: Secondary | ICD-10-CM

## 2017-01-28 ENCOUNTER — Ambulatory Visit (HOSPITAL_COMMUNITY)
Admission: RE | Admit: 2017-01-28 | Discharge: 2017-01-28 | Disposition: A | Discharge status: Home / Self Care | Payer: Medicaid Other | Source: Ambulatory Visit | Attending: Cardiovascular Disease | Admitting: Cardiovascular Disease

## 2017-01-28 ENCOUNTER — Telehealth: Payer: Self-pay | Admitting: *Deleted

## 2017-01-28 ENCOUNTER — Encounter (HOSPITAL_COMMUNITY)
Admission: RE | Admit: 2017-01-28 | Discharge: 2017-01-28 | Disposition: A | Discharge status: Home / Self Care | Payer: Medicaid Other | Source: Ambulatory Visit | Attending: Cardiovascular Disease | Admitting: Cardiovascular Disease

## 2017-01-28 DIAGNOSIS — R0600 Dyspnea, unspecified: Secondary | ICD-10-CM | POA: Diagnosis present

## 2017-01-28 DIAGNOSIS — I209 Angina pectoris, unspecified: Secondary | ICD-10-CM | POA: Insufficient documentation

## 2017-01-28 DIAGNOSIS — R0609 Other forms of dyspnea: Secondary | ICD-10-CM | POA: Diagnosis not present

## 2017-01-28 DIAGNOSIS — I361 Nonrheumatic tricuspid (valve) insufficiency: Secondary | ICD-10-CM | POA: Insufficient documentation

## 2017-01-28 DIAGNOSIS — E785 Hyperlipidemia, unspecified: Secondary | ICD-10-CM | POA: Diagnosis not present

## 2017-01-28 LAB — NM MYOCAR MULTI W/SPECT W/WALL MOTION / EF
CSEPPHR: 116 {beats}/min
LV dias vol: 78 mL (ref 46–106)
LVSYSVOL: 26 mL
RATE: 0.43
Rest HR: 57 {beats}/min
SDS: 1
SRS: 0
SSS: 1
TID: 1.07

## 2017-01-28 MED ORDER — TECHNETIUM TC 99M TETROFOSMIN IV KIT
30.0000 | PACK | Freq: Once | INTRAVENOUS | Status: AC | PRN
  Administered 2017-01-28: 30 via INTRAVENOUS

## 2017-01-28 MED ORDER — PERFLUTREN LIPID MICROSPHERE
1.0000 mL | INTRAVENOUS | Status: AC | PRN
  Administered 2017-01-28: 2 mL via INTRAVENOUS
  Filled 2017-01-28: qty 10

## 2017-01-28 MED ORDER — REGADENOSON 0.4 MG/5ML IV SOLN
INTRAVENOUS | Status: AC
  Administered 2017-01-28: 0.4 mg via INTRAVENOUS
  Filled 2017-01-28: qty 5

## 2017-01-28 MED ORDER — TECHNETIUM TC 99M TETROFOSMIN IV KIT
10.0000 | PACK | Freq: Once | INTRAVENOUS | Status: AC | PRN
  Administered 2017-01-28: 10 via INTRAVENOUS

## 2017-01-28 MED ORDER — SODIUM CHLORIDE 0.9% FLUSH
INTRAVENOUS | Status: AC
  Administered 2017-01-28: 10 mL via INTRAVENOUS
  Filled 2017-01-28: qty 10

## 2017-01-28 NOTE — Telephone Encounter (Signed)
Called patient with test results. No answer. Left message to call back.  

## 2017-01-28 NOTE — Progress Notes (Signed)
*  PRELIMINARY RESULTS* Echocardiogram 2D Echocardiogram with definity has been performed.  Jeryl Columbia 01/28/2017, 10:43 AM

## 2017-01-28 NOTE — Telephone Encounter (Signed)
-----   Message from Laqueta Linden, MD sent at 01/28/2017 11:28 AM EDT ----- Normal pumping function.

## 2017-01-29 ENCOUNTER — Telehealth: Payer: Self-pay

## 2017-01-29 NOTE — Telephone Encounter (Signed)
Called pt. No answer. Left message for pt to return call.  

## 2017-01-29 NOTE — Telephone Encounter (Signed)
-----   Message from Laqueta Linden, MD sent at 01/28/2017  3:47 PM EDT ----- Normal.

## 2017-02-13 ENCOUNTER — Encounter: Payer: Self-pay | Admitting: Pediatrics

## 2017-02-13 ENCOUNTER — Ambulatory Visit (INDEPENDENT_AMBULATORY_CARE_PROVIDER_SITE_OTHER): Payer: Medicaid Other | Admitting: Pediatrics

## 2017-02-13 VITALS — BP 126/83 | HR 62 | Temp 97.4°F | Ht 69.0 in | Wt 196.0 lb

## 2017-02-13 DIAGNOSIS — J3089 Other allergic rhinitis: Secondary | ICD-10-CM | POA: Diagnosis not present

## 2017-02-13 DIAGNOSIS — G40909 Epilepsy, unspecified, not intractable, without status epilepticus: Secondary | ICD-10-CM | POA: Diagnosis not present

## 2017-02-13 DIAGNOSIS — F329 Major depressive disorder, single episode, unspecified: Secondary | ICD-10-CM

## 2017-02-13 DIAGNOSIS — F32A Depression, unspecified: Secondary | ICD-10-CM

## 2017-02-13 MED ORDER — FLUTICASONE PROPIONATE 50 MCG/ACT NA SUSP: 2.0000 | Freq: Every day | NASAL | 6 refills | Status: DC

## 2017-02-13 MED ORDER — CETIRIZINE HCL 10 MG PO TABS: 10.0000 mg | ORAL_TABLET | Freq: Every day | ORAL | 11 refills | Status: DC

## 2017-02-13 MED ORDER — ESCITALOPRAM OXALATE 5 MG PO TABS: 5.0000 mg | ORAL_TABLET | Freq: Every day | ORAL | 2 refills | Status: DC

## 2017-02-13 NOTE — Patient Instructions (Addendum)
[phone] [facility]  [email or web address]  Your provider wants you to schedule an appointment with a Psychologist/Psychiatrist. The following list of offices requires the patient to call and make their own appointment, as there is information they need that only you can provide. Please feel free to choose form the following providers:  [facility]   [phone] Crisis Recovery in [place] [phone]  [facility]  [phone] [place], [place]  (Scheduled through [facility]) Must call and do an interview for appointment. Sees Children / Accepts Medicaid  [facility]    [phone]  [address], [address]    [place], [place]       [facility]  [phone] [address] [place], [place]  Evaluates for Autism but does not treat it Sees Children / Accepts Medicaid  [facility]    [phone] [address], [address]   [place], [place] Medication management, substance abuse, bipolar, grief, family, marriage, OCD, anxiety, PTSD Sees children / Accepts Medicaid  [facility]    [phone] [address] [place], [place] Sees children / Accepts [facility]  [facility]  [phone] [address] [place], [place]   Dr Estelle Grumbles     [phone] [address] [place], [place]  Sees ADD & ADHD for treatment Accepts Medicaid  [facility]  [phone] [address] [place], [place] Evaluates for Autism Accepts [facility]  [facility]  [phone] [address] [place], [place]  Does Adult ADD evaluations Does not accept Medicaid  [facility]   [phone] [address] [place], [place] Uses animal therapy  Sees children as young as 34 years old Accepts [facility]     [phone]    [address]  [place], [place] [address] Sees children Accepts  Medicaid

## 2017-02-13 NOTE — Progress Notes (Signed)
  Subjective:   Patient ID: Colleen Sellers, female    DOB: [birth year], 51 y.o.   MRN: [id number] CC: Follow-up (8 week) depression HPI: Colleen Sellers is a 51 y.o. female presenting for Follow-up (8 week)  Thinks she is getting out more, mood improved Feeling better Not as depressed No thoughts of self harm Getting along better with family Getting autopsy report back on daughter was hard Still interested in counseling  Seizures: following with neurology Most recent seizure 6 weeks Restarted on antiseizure medication  Allergy symptoms worse this time of year, wants to restart  Relevant past medical, surgical, family and social history reviewed. Allergies and medications reviewed and updated. History  Smoking Status  . Never Smoker  Smokeless Tobacco  . Never Used   ROS: Per HPI   Objective:    BP 126/83   Pulse 62   Temp 97.4 F (36.3 C) (Oral)   Ht 5\' 9"  (1.753 m)   Wt 196 lb (88.9 kg)   LMP 09/16/1994   BMI 28.94 kg/m   Wt Readings from Last 3 Encounters:  02/13/17 196 lb (88.9 kg)  01/10/17 197 lb (89.4 kg)  12/16/16 197 lb (89.4 kg)    Gen: NAD, alert, cooperative with exam, NCAT EYES: EOMI, no conjunctival injection, or no icterus ENT: OP without erythema LYMPH: no cervical LAD CV: NRRR, normal S1/S2, no murmur Resp: CTABL, no wheezes, normal WOB Ext: No edema, warm Neuro: Alert and oriented MSK: normal muscle bulk Psych: well groomed, normal affect, denies thoughts of SI  Assessment & Plan:  Tashima was seen today for follow-up multiple med problems.  Diagnoses and all orders for this visit:  Allergic rhinitis due to other allergic trigger, unspecified seasonality -     fluticasone (FLONASE) 50 MCG/ACT nasal spray; Place 2 sprays into both nostrils daily. -     cetirizine (ZYRTEC) 10 MG tablet; Take 1 tablet (10 mg total) by mouth daily.  Depression, unspecified depression type Improved but ongoing symptoms Feels safe at home Still grieving  daughters death earlier this year Will start below, pt to call to set up counseling -     escitalopram (LEXAPRO) 5 MG tablet; Take 1 tablet (5 mg total) by mouth daily.  Seizure disorder [facility]) Follows with neurology, on antiseizure med Says she is not driving per seizure precautions given to her by neurology with recent seizures  Follow up plan: Return in about 3 months (around 05/16/2017). Rex Kras, MD [facility] [place]

## 2017-02-21 ENCOUNTER — Telehealth: Payer: Self-pay | Admitting: Pediatrics

## 2017-02-21 NOTE — Telephone Encounter (Signed)
Letter faxed and patient aware.

## 2017-02-21 NOTE — Telephone Encounter (Signed)
Spoke with pt, printed letter, can you fax it?

## 2017-03-17 ENCOUNTER — Other Ambulatory Visit: Payer: Self-pay | Admitting: Pediatrics

## 2017-03-17 DIAGNOSIS — G8929 Other chronic pain: Secondary | ICD-10-CM

## 2017-03-17 DIAGNOSIS — M5441 Lumbago with sciatica, right side: Principal | ICD-10-CM

## 2017-03-24 ENCOUNTER — Other Ambulatory Visit: Payer: Self-pay | Admitting: Pediatrics

## 2017-03-24 DIAGNOSIS — F32A Depression, unspecified: Secondary | ICD-10-CM

## 2017-03-24 DIAGNOSIS — F329 Major depressive disorder, single episode, unspecified: Secondary | ICD-10-CM

## 2017-04-11 ENCOUNTER — Encounter: Payer: Self-pay | Admitting: Pediatrics

## 2017-04-11 ENCOUNTER — Ambulatory Visit (INDEPENDENT_AMBULATORY_CARE_PROVIDER_SITE_OTHER): Payer: Medicaid Other | Admitting: Pediatrics

## 2017-04-11 VITALS — BP 135/80 | HR 63 | Temp 97.9°F | Ht 69.0 in | Wt 195.2 lb

## 2017-04-11 DIAGNOSIS — R110 Nausea: Secondary | ICD-10-CM | POA: Diagnosis not present

## 2017-04-11 DIAGNOSIS — R51 Headache: Secondary | ICD-10-CM

## 2017-04-11 DIAGNOSIS — W57XXXA Bitten or stung by nonvenomous insect and other nonvenomous arthropods, initial encounter: Secondary | ICD-10-CM

## 2017-04-11 DIAGNOSIS — R5383 Other fatigue: Secondary | ICD-10-CM | POA: Diagnosis not present

## 2017-04-11 MED ORDER — DOXYCYCLINE HYCLATE 100 MG PO TABS: 100.0000 mg | ORAL_TABLET | Freq: Two times a day (BID) | ORAL | 0 refills | Status: DC

## 2017-04-11 NOTE — Addendum Note (Signed)
Addended by: Gwenith Daily on: 04/11/2017 06:12 PM   Modules accepted: Orders

## 2017-04-11 NOTE — Progress Notes (Signed)
  Subjective:   Patient ID: Colleen Sellers, female    DOB: March 26, 1966, 51 y.o.   MRN: [id number] CC: Dizziness; Headache; Nausea; Fatigue; and Insect Bite (03/23/17)  HPI: Colleen Sellers is a 51 y.o. female presenting for Dizziness; Headache; Nausea; Fatigue; and Insect Bite (03/23/17)  Thinks tick was attached for two days Went fishing and found tick two days later Felt sick with HA, nausea for apprx past week Having new joint pain, arm Subjective fevers at home Appetite down No vomiting  Does have h/o fibromyalgia, this feels different than fibromyalgia flares  Relevant past medical, surgical, family and social history reviewed. Allergies and medications reviewed and updated. History  Smoking Status  . Never Smoker  Smokeless Tobacco  . Never Used   ROS: Per HPI   Objective:    BP 135/80   Pulse 63   Temp 97.9 F (36.6 C) (Oral)   Ht 5\' 9"  (1.753 m)   Wt 195 lb 3.2 oz (88.5 kg)   LMP 09/16/1994   BMI 28.83 kg/m   Wt Readings from Last 3 Encounters:  04/11/17 195 lb 3.2 oz (88.5 kg)  02/13/17 196 lb (88.9 kg)  01/10/17 197 lb (89.4 kg)    Gen: NAD, alert, cooperative with exam, NCAT EYES: EOMI, no conjunctival injection, or no icterus ENT:  TMs pearly gray b/l, OP without erythema LYMPH: no cervical LAD CV: NRRR, normal S1/S2, no murmur, distal pulses 2+ b/l Resp: CTABL, no wheezes, normal WOB Abd: +BS, soft, NTND.  Ext: No edema, warm Neuro: Alert and oriented  Assessment & Plan:  Colleen Sellers was seen today for dizziness, headache, nausea, fatigue and insect bite.  Diagnoses and all orders for this visit:  Tick bite, initial encounter +joint pain, subjective fevers, HA since tick bite Will treat with below Return precautions discussed -     doxycycline (VIBRA-TABS) 100 MG tablet; Take 1 tablet (100 mg total) by mouth 2 (two) times daily.  Follow up plan: Return if symptoms worsen or fail to improve. Rex Kras, MD [facility] [place]

## 2017-05-21 ENCOUNTER — Other Ambulatory Visit: Payer: Self-pay | Admitting: Pediatrics

## 2017-05-21 DIAGNOSIS — G8929 Other chronic pain: Secondary | ICD-10-CM

## 2017-05-21 DIAGNOSIS — M5441 Lumbago with sciatica, right side: Principal | ICD-10-CM

## 2017-05-22 ENCOUNTER — Ambulatory Visit (INDEPENDENT_AMBULATORY_CARE_PROVIDER_SITE_OTHER): Payer: Medicaid Other | Admitting: Pediatrics

## 2017-05-22 ENCOUNTER — Encounter: Payer: Self-pay | Admitting: Pediatrics

## 2017-05-22 VITALS — BP 119/80 | HR 75 | Temp 97.9°F | Ht 69.0 in | Wt 198.6 lb

## 2017-05-22 DIAGNOSIS — G40909 Epilepsy, unspecified, not intractable, without status epilepticus: Secondary | ICD-10-CM

## 2017-05-22 DIAGNOSIS — R928 Other abnormal and inconclusive findings on diagnostic imaging of breast: Secondary | ICD-10-CM

## 2017-05-22 DIAGNOSIS — G43809 Other migraine, not intractable, without status migrainosus: Secondary | ICD-10-CM

## 2017-05-22 DIAGNOSIS — F339 Major depressive disorder, recurrent, unspecified: Secondary | ICD-10-CM | POA: Diagnosis not present

## 2017-05-22 MED ORDER — ESCITALOPRAM OXALATE 10 MG PO TABS: 10.0000 mg | ORAL_TABLET | Freq: Every day | ORAL | 5 refills | Status: DC

## 2017-05-22 NOTE — Progress Notes (Signed)
  Subjective:   Patient ID: Colleen Sellers, female    DOB: 1965-10-14, 51 y.o.   MRN: [id number] CC: Follow-up (3 month) multiple med problems HPI: Colleen Sellers is a 51 y.o. 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 Allergies and medications reviewed and updated. History  Smoking Status  . Never Smoker  Smokeless Tobacco  . Never Used   ROS: Per HPI   Objective:    BP 119/80   Pulse 75   Temp 97.9 F (36.6 C) (Oral)   Ht 5\' 9"  (1.753 m)   Wt 198 lb 9.6 oz (90.1 kg)   LMP 09/16/1994   BMI 29.33 kg/m   Wt Readings from Last 3 Encounters:  05/22/17 198 lb 9.6 oz (90.1 kg)  04/11/17 195 lb 3.2 oz (88.5 kg)  02/13/17 196 lb (88.9 kg)    Gen: NAD, alert, cooperative with exam, NCAT EYES: EOMI, no conjunctival injection, or no icterus ENT:  OP without erythema LYMPH: no cervical LAD CV: NRRR, normal S1/S2, no murmur, distal pulses 2+ b/l Resp: CTABL, no wheezes, normal WOB Abd: +BS, soft, NTND. Ext: No edema, warm Neuro: Alert and oriented MSK: normal muscle bulk Psych: well dressed, normal affect, no thoughts of self harm  Assessment & Plan:  Colleen Sellers was seen today for follow-up med problems.  Diagnoses and all orders for this visit:  Recurrent major depressive  disorder, remission status unspecified (HCC) Ongoing symptoms, increase lexapro to 10mg  Cont to encourage counseling, pt says she will think about it Going to start grief counseling -     escitalopram (LEXAPRO) 10 MG tablet; Take 1 tablet (10 mg total) by mouth daily.  Other migraine without status migrainosus, not intractable Stable, follows with neurology  Seizure disorder (HCC) No seizures since last visit, follows with neurology  Abnormal mammogram Needs follow up imaging, if she doesn't hear about appt in 1 week let Colleen Sellers know  Follow up plan: Return in about 6 months (around 11/19/2017). Rex Kras, MD [facility]

## 2017-05-23 ENCOUNTER — Other Ambulatory Visit: Payer: Self-pay | Admitting: Pediatrics

## 2017-05-23 DIAGNOSIS — N6320 Unspecified lump in the left breast, unspecified quadrant: Secondary | ICD-10-CM

## 2017-05-29 ENCOUNTER — Telehealth: Payer: Self-pay | Admitting: Pediatrics

## 2017-05-29 ENCOUNTER — Other Ambulatory Visit: Payer: Self-pay | Admitting: Pediatrics

## 2017-05-29 DIAGNOSIS — N6320 Unspecified lump in the left breast, unspecified quadrant: Secondary | ICD-10-CM

## 2017-06-03 ENCOUNTER — Other Ambulatory Visit (HOSPITAL_COMMUNITY): Payer: Self-pay

## 2017-06-03 ENCOUNTER — Encounter (HOSPITAL_COMMUNITY): Payer: Self-pay

## 2017-06-10 ENCOUNTER — Ambulatory Visit (HOSPITAL_COMMUNITY)
Admission: RE | Admit: 2017-06-10 | Discharge: 2017-06-10 | Disposition: A | Discharge status: Home / Self Care | Payer: Medicaid Other | Source: Ambulatory Visit | Attending: Pediatrics | Admitting: Pediatrics

## 2017-06-10 ENCOUNTER — Telehealth: Payer: Self-pay

## 2017-06-10 ENCOUNTER — Other Ambulatory Visit: Payer: Self-pay | Admitting: Pediatrics

## 2017-06-10 DIAGNOSIS — N6310 Unspecified lump in the right breast, unspecified quadrant: Secondary | ICD-10-CM

## 2017-06-10 DIAGNOSIS — N6311 Unspecified lump in the right breast, upper outer quadrant: Secondary | ICD-10-CM | POA: Insufficient documentation

## 2017-06-10 DIAGNOSIS — N6320 Unspecified lump in the left breast, unspecified quadrant: Secondary | ICD-10-CM

## 2017-06-10 NOTE — Telephone Encounter (Signed)
Needs pre cert for breast US that was done today  Medicaid

## 2017-11-21 ENCOUNTER — Ambulatory Visit: Payer: Medicaid Other | Admitting: Pediatrics

## 2017-12-03 ENCOUNTER — Encounter: Payer: Self-pay | Admitting: Pediatrics

## 2017-12-15 ENCOUNTER — Ambulatory Visit: Payer: Medicaid Other | Admitting: Pediatrics

## 2017-12-15 ENCOUNTER — Encounter: Payer: Self-pay | Admitting: Pediatrics

## 2017-12-15 VITALS — BP 131/83 | HR 79 | Temp 96.9°F | Ht 69.0 in | Wt 209.8 lb

## 2017-12-15 DIAGNOSIS — F339 Major depressive disorder, recurrent, unspecified: Secondary | ICD-10-CM | POA: Diagnosis not present

## 2017-12-15 DIAGNOSIS — K635 Polyp of colon: Secondary | ICD-10-CM

## 2017-12-15 DIAGNOSIS — J3089 Other allergic rhinitis: Secondary | ICD-10-CM | POA: Diagnosis not present

## 2017-12-15 DIAGNOSIS — R7989 Other specified abnormal findings of blood chemistry: Secondary | ICD-10-CM

## 2017-12-15 DIAGNOSIS — R5383 Other fatigue: Secondary | ICD-10-CM

## 2017-12-15 DIAGNOSIS — R7303 Prediabetes: Secondary | ICD-10-CM | POA: Diagnosis not present

## 2017-12-15 LAB — BAYER DCA HB A1C WAIVED: HB A1C (BAYER DCA - WAIVED): 5.4 % (ref <7.0)

## 2017-12-15 MED ORDER — ESCITALOPRAM OXALATE 20 MG PO TABS: 20.0000 mg | ORAL_TABLET | Freq: Every day | ORAL | 3 refills | Status: DC

## 2017-12-15 MED ORDER — FEXOFENADINE HCL 180 MG PO TABS: 180.0000 mg | ORAL_TABLET | Freq: Every day | ORAL | 11 refills | Status: DC

## 2017-12-15 NOTE — Patient Instructions (Signed)
[facility] Appointment: [phone]  

## 2017-12-15 NOTE — Progress Notes (Signed)
Subjective:   Patient ID: Colleen Sellers, female    DOB: January 11, 1966, 52 y.o.   MRN: [id number] CC: Follow-up Multiple medical problems HPI: Colleen Sellers is a 52 y.o. female presenting for Follow-up  Feels like energy levels are down. Lights and TV off around midnight.  Goes to sleep around 2 AM.  Feels like she tosses and turns until she wakes up around 9 to 10 AM the next day.  Narcolepsy: taking adderall twice a day. Following with neurology, Dr. Jonelle Sidle.  Depressed mood: Stop Lexapro about 2 weeks ago.  Concerned that it was causing some weight gain.  Has gained about 10 pounds in the last 7 months.  She does think it helps some with her mood.  Denies thoughts of self-harm.  Says she feels safe at home.  Elevated BMI: Drinking about 2 regular sodas a day, coffee with sugar.  Not drinking much water.  Not able to walk regularly due to her arthritis in her back.  Also with fibromyalgia.  Thinks she has had a colonoscopy some years ago and was supposed to have a follow-up but has not yet had one.  Overdue.  Depression screen [facility] 2/9 12/15/2017 05/22/2017 04/11/2017 02/13/2017 12/16/2016  Decreased Interest 2 0 _0 Down, Depressed, Hopeless 3 0 _1 PHQ - 2 Score 5 0 _2 Altered sleeping 3 - _3 Tired, decreased energy 3 - _4 Change in appetite 3 - _5 Feeling bad or failure about yourself  1 - _6 Trouble concentrating 3 - _7 Moving slowly or fidgety/restless 0 - 0 0 1  Suicidal thoughts 1 - 0 0 0  PHQ-9 Score 19 - _8 Difficult doing work/chores Somewhat difficult - Somewhat difficult Somewhat difficult Very difficult     Relevant past medical, surgical, family and social history reviewed. Allergies and medications reviewed and updated. Social History   Tobacco Use  Smoking Status Never Smoker  Smokeless Tobacco Never Used   ROS: Per HPI   Objective:    BP 131/83   Pulse 79   Temp (!) 96.9 F (36.1 C) (Oral)   Ht _9  (1.753 m)   Wt 209 lb 12.8  oz (95.2 kg)   LMP 09/16/1994   BMI 30.98 kg/m   Wt Readings from Last 3 Encounters:  12/15/17 209 lb 12.8 oz (95.2 kg)  05/22/17 198 lb 9.6 oz (90.1 kg)  04/11/17 195 lb 3.2 oz (88.5 kg)    Gen: NAD, alert, cooperative with exam, NCAT EYES: EOMI, no conjunctival injection, or no icterus CV: NRRR, normal S1/S2, no murmur, distal pulses 2+ b/l Resp: CTABL, no wheezes, normal WOB Abd: +BS, soft, NTND. no guarding or organomegaly Ext: No edema, warm Neuro: Alert and oriented Psych: Normal affect  Assessment & Plan:  Colleen Sellers was seen today for follow-up.  Diagnoses and all orders for this visit:  Allergic rhinitis due to other allergic trigger, unspecified seasonality -     fexofenadine (ALLEGRA ALLERGY) 180 MG tablet; Take 1 tablet (180 mg total) by mouth daily.  Recurrent major depressive disorder, remission status unspecified (HCC) Ongoing symptoms.  We will go back to 10 mg for 2 weeks, then increase to 20 mg. -     escitalopram (LEXAPRO) 20 MG tablet; Take 1 tablet (20 mg total) by mouth daily.  Pre-diabetes A1c 5.7 last visit.  We will repeat today.  Recommend decreasing regular sodas to 0. -     Bayer DCA Hb A1c Waived  Abnormal thyroid blood test -     TSH  Other fatigue -     CBC with Differential -     CMP14+EGFR -     VITAMIN D 25 Hydroxy (Vit-D Deficiency, Fractures)  Polyp of colon, unspecified part of colon, unspecified type Colon cancer screening: Recommend a colonoscopy.  Patient with polyps on last per patient report.  Last seen in [place], Dr. Gala Romney.  Declined referral today.  Follow up plan: Return in about 2 months (around 02/14/2018). Assunta Found, MD [facility]

## 2017-12-16 LAB — CBC WITH DIFFERENTIAL/PLATELET
BASOS ABS: 0 10*3/uL (ref 0.0–0.2)
BASOS: 1 %
EOS (ABSOLUTE): 0.1 10*3/uL (ref 0.0–0.4)
Eos: 1 %
Hematocrit: 41 % (ref 34.0–46.6)
Hemoglobin: 13.8 g/dL (ref 11.1–15.9)
Immature Grans (Abs): 0 10*3/uL (ref 0.0–0.1)
Immature Granulocytes: 0 %
Lymphocytes Absolute: 2.5 10*3/uL (ref 0.7–3.1)
Lymphs: 30 %
MCH: 30.6 pg (ref 26.6–33.0)
MCHC: 33.7 g/dL (ref 31.5–35.7)
MCV: 91 fL (ref 79–97)
MONOS ABS: 0.7 10*3/uL (ref 0.1–0.9)
Monocytes: 9 %
Neutrophils Absolute: 4.9 10*3/uL (ref 1.4–7.0)
Neutrophils: 59 %
PLATELETS: 387 10*3/uL — AB (ref 150–379)
RBC: 4.51 x10E6/uL (ref 3.77–5.28)
RDW: 12.9 % (ref 12.3–15.4)
WBC: 8.3 10*3/uL (ref 3.4–10.8)

## 2017-12-16 LAB — CMP14+EGFR
A/G RATIO: 1.6 (ref 1.2–2.2)
ALT: 24 IU/L (ref 0–32)
AST: 28 IU/L (ref 0–40)
Albumin: 4.6 g/dL (ref 3.5–5.5)
Alkaline Phosphatase: 89 IU/L (ref 39–117)
BILIRUBIN TOTAL: 0.3 mg/dL (ref 0.0–1.2)
BUN/Creatinine Ratio: 10 (ref 9–23)
BUN: 11 mg/dL (ref 6–24)
CHLORIDE: 99 mmol/L (ref 96–106)
CO2: 22 mmol/L (ref 20–29)
Calcium: 10 mg/dL (ref 8.7–10.2)
Creatinine, Ser: 1.09 mg/dL — ABNORMAL HIGH (ref 0.57–1.00)
GFR calc Af Amer: 67 mL/min/{1.73_m2} (ref >59)
GFR calc non Af Amer: 58 mL/min/{1.73_m2} — ABNORMAL LOW (ref >59)
GLUCOSE: 99 mg/dL (ref 65–99)
Globulin, Total: 2.9 g/dL (ref 1.5–4.5)
Potassium: 4.6 mmol/L (ref 3.5–5.2)
Sodium: 139 mmol/L (ref 134–144)
Total Protein: 7.5 g/dL (ref 6.0–8.5)

## 2017-12-16 LAB — TSH: TSH: 5.23 u[IU]/mL — ABNORMAL HIGH (ref 0.450–4.500)

## 2017-12-16 LAB — VITAMIN D 25 HYDROXY (VIT D DEFICIENCY, FRACTURES): VIT D 25 HYDROXY: 35.1 ng/mL (ref 30.0–100.0)

## 2017-12-17 ENCOUNTER — Other Ambulatory Visit: Payer: Self-pay | Admitting: Pediatrics

## 2017-12-17 LAB — SPECIMEN STATUS REPORT

## 2017-12-17 LAB — T4, FREE: Free T4: 1 ng/dL (ref 0.82–1.77)

## 2017-12-25 ENCOUNTER — Other Ambulatory Visit: Payer: Self-pay | Admitting: Pediatrics

## 2017-12-25 DIAGNOSIS — E039 Hypothyroidism, unspecified: Secondary | ICD-10-CM

## 2017-12-25 MED ORDER — LEVOTHYROXINE SODIUM 25 MCG PO TABS: 50.0000 ug | ORAL_TABLET | Freq: Every day | ORAL | 3 refills | Status: DC

## 2017-12-31 ENCOUNTER — Telehealth: Payer: Self-pay | Admitting: Pediatrics

## 2017-12-31 DIAGNOSIS — E039 Hypothyroidism, unspecified: Secondary | ICD-10-CM

## 2017-12-31 MED ORDER — LEVOTHYROXINE SODIUM 50 MCG PO TABS: 50.0000 ug | ORAL_TABLET | Freq: Every day | ORAL | 1 refills | Status: DC

## 2017-12-31 NOTE — Telephone Encounter (Signed)
Please advise 

## 2017-12-31 NOTE — Telephone Encounter (Signed)
Aware. 

## 2018-01-09 DIAGNOSIS — M51369 Other intervertebral disc degeneration, lumbar region without mention of lumbar back pain or lower extremity pain: Secondary | ICD-10-CM | POA: Insufficient documentation

## 2018-01-09 DIAGNOSIS — M47816 Spondylosis without myelopathy or radiculopathy, lumbar region: Secondary | ICD-10-CM | POA: Diagnosis not present

## 2018-01-09 DIAGNOSIS — Z79891 Long term (current) use of opiate analgesic: Secondary | ICD-10-CM | POA: Diagnosis not present

## 2018-01-09 DIAGNOSIS — M5136 Other intervertebral disc degeneration, lumbar region: Secondary | ICD-10-CM | POA: Insufficient documentation

## 2018-01-09 DIAGNOSIS — G894 Chronic pain syndrome: Secondary | ICD-10-CM | POA: Diagnosis not present

## 2018-01-09 DIAGNOSIS — M545 Low back pain: Secondary | ICD-10-CM | POA: Diagnosis not present

## 2018-01-09 DIAGNOSIS — M47814 Spondylosis without myelopathy or radiculopathy, thoracic region: Secondary | ICD-10-CM | POA: Diagnosis not present

## 2018-02-18 ENCOUNTER — Encounter: Payer: Self-pay | Admitting: Pediatrics

## 2018-02-18 ENCOUNTER — Ambulatory Visit: Payer: Medicaid Other | Admitting: Pediatrics

## 2018-02-18 ENCOUNTER — Telehealth: Payer: Self-pay | Admitting: *Deleted

## 2018-02-18 VITALS — BP 133/83 | HR 70 | Temp 97.3°F | Ht 69.0 in | Wt 202.8 lb

## 2018-02-18 DIAGNOSIS — R319 Hematuria, unspecified: Secondary | ICD-10-CM | POA: Diagnosis not present

## 2018-02-18 DIAGNOSIS — B379 Candidiasis, unspecified: Secondary | ICD-10-CM

## 2018-02-18 DIAGNOSIS — K635 Polyp of colon: Secondary | ICD-10-CM

## 2018-02-18 DIAGNOSIS — E039 Hypothyroidism, unspecified: Secondary | ICD-10-CM | POA: Diagnosis not present

## 2018-02-18 LAB — URINALYSIS, COMPLETE
BILIRUBIN UA: NEGATIVE
Glucose, UA: NEGATIVE
Leukocytes, UA: NEGATIVE
NITRITE UA: NEGATIVE
PH UA: 5 (ref 5.0–7.5)
RBC UA: NEGATIVE
Urobilinogen, Ur: 0.2 mg/dL (ref 0.2–1.0)

## 2018-02-18 LAB — MICROSCOPIC EXAMINATION: RENAL EPITHEL UA: NONE SEEN /HPF

## 2018-02-18 MED ORDER — FLUCONAZOLE 150 MG PO TABS: 150.0000 mg | ORAL_TABLET | Freq: Once | ORAL | 0 refills | Status: AC

## 2018-02-18 NOTE — Telephone Encounter (Signed)
Patient aware of urine results, yeast and protein.  Per Dr. Oswaldo Done, fluconazole sent to pharmacy.  Will recheck urine next time in the office

## 2018-02-18 NOTE — Progress Notes (Signed)
  Subjective:   Patient ID: Colleen Sellers, female    DOB: [birth year], 52 y.o.   MRN: [id number] CC: Medical Management of Chronic Issues  HPI: Colleen Sellers is a 52 y.o. female   Here today for follow-up thyroid.  Due for repeat TSH asymptomatic.Marland Kitchen   History of colon polyps: Due for repeat colonoscopy  Last UA with microscopic hematuria, will repeat.  Some vaginal irritation, no new sexual partners.  As noted she is using the bathroom more often than usual and last couple weeks.  Relevant past medical, surgical, family and social history reviewed. Allergies and medications reviewed and updated. Social History   Tobacco Use  Smoking Status Never Smoker  Smokeless Tobacco Never Used   ROS: Per HPI   Objective:    BP 133/83   Pulse 70   Temp (!) 97.3 F (36.3 C) (Oral)   Ht 5\' 9"  (1.753 m)   Wt 202 lb 12.8 oz (92 kg)   LMP 09/16/1994   BMI 29.95 kg/m   Wt Readings from Last 3 Encounters:  02/18/18 202 lb 12.8 oz (92 kg)  12/15/17 209 lb 12.8 oz (95.2 kg)  05/22/17 198 lb 9.6 oz (90.1 kg)    Gen: NAD, alert, cooperative with exam, NCAT EYES: EOMI, no conjunctival injection, or no icterus ENT:  TMs pearly gray b/l, OP without erythema LYMPH: no cervical LAD CV: NRRR, normal S1/S2, no murmur, distal pulses 2+ b/l Resp: CTABL, no wheezes, normal WOB Abd: +BS, soft, NTND. no guarding or organomegaly Ext: No edema, warm Neuro: Alert and oriented, strength equal b/l UE and LE, coordination grossly normal MSK: normal muscle bulk  Assessment & Plan:  Colleen Sellers was seen today for medical management of chronic issues.  Diagnoses and all orders for this visit:  Hypothyroidism, unspecified type Due for repeat thyroid level -     TSH  Polyp of colon, unspecified part of colon, unspecified type -     Ambulatory referral to Gastroenterology  Hematuria, unspecified type UA negative for hematuria, did have yeast.  Possibly because of current urinary/vaginal symptoms.  Will treat as  below.  If not improving let me know. -     Urinalysis, Complete  Yeast infection -     fluconazole (DIFLUCAN) 150 MG tablet; Take 1 tablet (150 mg total) by mouth once for 1 dose.  Other orders -     Microscopic Examination   Follow up plan: Return in about 6 months (around 08/20/2018). Rex Kras, MD [facility]

## 2018-02-19 ENCOUNTER — Other Ambulatory Visit: Payer: Self-pay | Admitting: Pediatrics

## 2018-02-19 DIAGNOSIS — B379 Candidiasis, unspecified: Secondary | ICD-10-CM

## 2018-02-19 LAB — TSH: TSH: 0.927 u[IU]/mL (ref 0.450–4.500)

## 2018-02-19 MED ORDER — FLUCONAZOLE 150 MG PO TABS: 150.0000 mg | ORAL_TABLET | Freq: Once | ORAL | 0 refills | Status: AC

## 2018-02-25 ENCOUNTER — Encounter: Payer: Self-pay | Admitting: Gastroenterology

## 2018-02-25 ENCOUNTER — Other Ambulatory Visit: Payer: Self-pay | Admitting: Pediatrics

## 2018-02-25 DIAGNOSIS — F339 Major depressive disorder, recurrent, unspecified: Secondary | ICD-10-CM

## 2018-04-08 DIAGNOSIS — G40219 Localization-related (focal) (partial) symptomatic epilepsy and epileptic syndromes with complex partial seizures, intractable, without status epilepticus: Secondary | ICD-10-CM | POA: Diagnosis not present

## 2018-04-08 DIAGNOSIS — R42 Dizziness and giddiness: Secondary | ICD-10-CM | POA: Diagnosis not present

## 2018-04-08 DIAGNOSIS — F331 Major depressive disorder, recurrent, moderate: Secondary | ICD-10-CM | POA: Diagnosis not present

## 2018-04-08 DIAGNOSIS — G43019 Migraine without aura, intractable, without status migrainosus: Secondary | ICD-10-CM | POA: Diagnosis not present

## 2018-05-04 DIAGNOSIS — M47816 Spondylosis without myelopathy or radiculopathy, lumbar region: Secondary | ICD-10-CM | POA: Diagnosis not present

## 2018-05-14 ENCOUNTER — Other Ambulatory Visit: Payer: Self-pay | Admitting: Pediatrics

## 2018-05-14 DIAGNOSIS — Z1231 Encounter for screening mammogram for malignant neoplasm of breast: Secondary | ICD-10-CM

## 2018-05-22 ENCOUNTER — Ambulatory Visit: Payer: Medicaid Other | Admitting: Gastroenterology

## 2018-06-12 ENCOUNTER — Ambulatory Visit (HOSPITAL_COMMUNITY): Payer: Medicaid Other

## 2018-06-17 ENCOUNTER — Other Ambulatory Visit: Payer: Self-pay | Admitting: Pediatrics

## 2018-06-17 DIAGNOSIS — Z1231 Encounter for screening mammogram for malignant neoplasm of breast: Secondary | ICD-10-CM

## 2018-06-25 ENCOUNTER — Encounter (HOSPITAL_COMMUNITY): Payer: Self-pay

## 2018-06-25 ENCOUNTER — Ambulatory Visit (HOSPITAL_COMMUNITY)
Admission: RE | Admit: 2018-06-25 | Discharge: 2018-06-25 | Disposition: A | Discharge status: Home / Self Care | Payer: Medicaid Other | Source: Ambulatory Visit | Attending: Pediatrics | Admitting: Pediatrics

## 2018-06-25 DIAGNOSIS — Z1231 Encounter for screening mammogram for malignant neoplasm of breast: Secondary | ICD-10-CM | POA: Insufficient documentation

## 2018-06-26 ENCOUNTER — Telehealth: Payer: Self-pay

## 2018-06-26 NOTE — Telephone Encounter (Signed)
Went to [facility] yesterday and they said they did not have the right order for a DX Mammo.

## 2018-07-03 ENCOUNTER — Other Ambulatory Visit: Payer: Self-pay | Admitting: Pediatrics

## 2018-07-03 DIAGNOSIS — N644 Mastodynia: Secondary | ICD-10-CM

## 2018-07-06 ENCOUNTER — Other Ambulatory Visit: Payer: Self-pay | Admitting: Pediatrics

## 2018-07-06 DIAGNOSIS — N644 Mastodynia: Secondary | ICD-10-CM

## 2018-07-06 NOTE — Telephone Encounter (Signed)
Pt states she is having lt breast pain;Dx mammo scheduled and letter sent with appointment  Date/time

## 2018-07-09 ENCOUNTER — Telehealth: Payer: Self-pay

## 2018-07-09 NOTE — Telephone Encounter (Signed)
Insurance denied since she has no problem with rt breast; rt breast u/s cancelled

## 2018-07-09 NOTE — Telephone Encounter (Signed)
Need [place] bilateral authorzied for breast  Only one is authorzied

## 2018-07-13 ENCOUNTER — Other Ambulatory Visit: Payer: Self-pay | Admitting: Pediatrics

## 2018-07-13 DIAGNOSIS — M47816 Spondylosis without myelopathy or radiculopathy, lumbar region: Secondary | ICD-10-CM | POA: Diagnosis not present

## 2018-07-13 DIAGNOSIS — N644 Mastodynia: Secondary | ICD-10-CM

## 2018-07-13 DIAGNOSIS — M545 Low back pain: Secondary | ICD-10-CM | POA: Diagnosis not present

## 2018-07-14 ENCOUNTER — Ambulatory Visit (HOSPITAL_COMMUNITY)
Admission: RE | Admit: 2018-07-14 | Discharge: 2018-07-14 | Disposition: A | Discharge status: Home / Self Care | Payer: Medicaid Other | Source: Ambulatory Visit | Attending: Pediatrics | Admitting: Pediatrics

## 2018-07-14 ENCOUNTER — Ambulatory Visit (HOSPITAL_COMMUNITY): Payer: Medicaid Other

## 2018-07-14 DIAGNOSIS — N644 Mastodynia: Secondary | ICD-10-CM | POA: Diagnosis not present

## 2018-07-14 DIAGNOSIS — R928 Other abnormal and inconclusive findings on diagnostic imaging of breast: Secondary | ICD-10-CM | POA: Diagnosis not present

## 2018-08-18 DIAGNOSIS — M47816 Spondylosis without myelopathy or radiculopathy, lumbar region: Secondary | ICD-10-CM | POA: Diagnosis not present

## 2018-08-18 DIAGNOSIS — G894 Chronic pain syndrome: Secondary | ICD-10-CM | POA: Diagnosis not present

## 2018-08-18 DIAGNOSIS — M533 Sacrococcygeal disorders, not elsewhere classified: Secondary | ICD-10-CM | POA: Diagnosis not present

## 2018-08-20 ENCOUNTER — Ambulatory Visit: Payer: Medicaid Other | Admitting: Pediatrics

## 2018-08-20 ENCOUNTER — Encounter: Payer: Self-pay | Admitting: Pediatrics

## 2018-08-20 ENCOUNTER — Telehealth: Payer: Self-pay

## 2018-08-20 VITALS — BP 131/84 | HR 62 | Temp 97.5°F | Ht 69.0 in | Wt 209.6 lb

## 2018-08-20 DIAGNOSIS — G40909 Epilepsy, unspecified, not intractable, without status epilepticus: Secondary | ICD-10-CM | POA: Diagnosis not present

## 2018-08-20 DIAGNOSIS — F339 Major depressive disorder, recurrent, unspecified: Secondary | ICD-10-CM | POA: Diagnosis not present

## 2018-08-20 DIAGNOSIS — K5900 Constipation, unspecified: Secondary | ICD-10-CM

## 2018-08-20 DIAGNOSIS — E039 Hypothyroidism, unspecified: Secondary | ICD-10-CM

## 2018-08-20 DIAGNOSIS — Z23 Encounter for immunization: Secondary | ICD-10-CM

## 2018-08-20 DIAGNOSIS — G43809 Other migraine, not intractable, without status migrainosus: Secondary | ICD-10-CM | POA: Diagnosis not present

## 2018-08-20 MED ORDER — LEVOTHYROXINE SODIUM 50 MCG PO TABS: 50.0000 ug | ORAL_TABLET | Freq: Every day | ORAL | 1 refills | Status: DC

## 2018-08-20 MED ORDER — LINACLOTIDE 145 MCG PO CAPS: 145.0000 ug | ORAL_CAPSULE | Freq: Every day | ORAL | 3 refills | Status: DC

## 2018-08-20 MED ORDER — ESCITALOPRAM OXALATE 20 MG PO TABS: 20.0000 mg | ORAL_TABLET | Freq: Every day | ORAL | 1 refills | Status: DC

## 2018-08-20 MED ORDER — SUMATRIPTAN SUCCINATE 100 MG PO TABS: ORAL_TABLET | ORAL | 5 refills | Status: DC

## 2018-08-20 NOTE — Telephone Encounter (Signed)
[facility] - Left Message  

## 2018-08-20 NOTE — Progress Notes (Signed)
  Subjective:   Patient ID: Colleen Sellers, female    DOB: [birth year], 52 y.o.   MRN: [id number] CC: Medical Management of Chronic Issues (6 month)  HPI: Colleen Sellers is a 52 y.o. female   Constipation: sometimes going a week between stools.  She tried Linzess in the past with improvement in symptoms.  Not taking any over-the-counter medicines now.  She is on chronic pain medicine for chronic low back pain, followed by outside doctor.  History of MS and seizure disorder: Following his neurologist in [place].  Has been sometime since follow-up.  Has not been on her seizure meds for months.    Depression: Previously followed by psychiatry in [facility] but they stopped taking her insurance, she has been out of her medicine for some time. Holidays are a tough time of year for her.  She denies any thoughts of self-harm.  She has good support from family members.  Relevant past medical, surgical, family and social history reviewed. Allergies and medications reviewed and updated. Social History   Tobacco Use  Smoking Status Never Smoker  Smokeless Tobacco Never Used   ROS: Per HPI   Objective:    BP 131/84   Pulse 62   Temp (!) 97.5 F (36.4 C) (Oral)   Ht 5\' 9"  (1.753 m)   Wt 209 lb 9.6 oz (95.1 kg)   LMP 09/16/1994   BMI 30.95 kg/m   Wt Readings from Last 3 Encounters:  08/20/18 209 lb 9.6 oz (95.1 kg)  02/18/18 202 lb 12.8 oz (92 kg)  12/15/17 209 lb 12.8 oz (95.2 kg)    Gen: NAD, alert, cooperative with exam, NCAT EYES: EOMI, no conjunctival injection, or no icterus ENT: OP without erythema LYMPH: no cervical LAD CV: NRRR, normal S1/S2, no murmur, distal pulses 2+ b/l Resp: CTABL, no wheezes, normal WOB Abd: +BS, soft, NTND. no guarding or organomegaly Ext: No edema, warm Neuro: Alert and oriented Psych: Full affect, no SI  Assessment & Plan:  Colleen Sellers was seen today for medical management of chronic issues.  Diagnoses and all orders for this visit:  Recurrent major  depressive disorder, remission status unspecified (HCC) Worsening symptoms.  Restart below.  Take 10 mg for 2 weeks, then increase to 20.  Return precautions discussed.  Will refer to virtual behavioral health.  Contact phone number given as well.  Any worsening symptoms to let me know. -     escitalopram (LEXAPRO) 20 MG tablet; Take 1 tablet (20 mg total) by mouth daily. -     Ambulatory referral to Psychiatry  Hypothyroidism, unspecified type Stable, continue below -     levothyroxine (SYNTHROID, LEVOTHROID) 50 MCG tablet; Take 1 tablet (50 mcg total) by mouth daily.  Seizure disorder [facility]) Follow-up with neurology, patient to call to schedule  Other migraine without status migrainosus, not intractable Needs refill on below, patient to call to schedule follow-up with neurology. -     SUMAtriptan (IMITREX) 100 MG tablet; TAKE 1 TABLET ORALLY AS NEEDED  Need for immunization against influenza -     Flu Vaccine QUAD 36+ mos IM  Constipation, unspecified constipation type Worsened with chronic pain medications.  History of IBS with diarrhea years ago. -     linaclotide (LINZESS) 145 MCG CAPS capsule; Take 1 capsule (145 mcg total) by mouth daily before breakfast.   Follow up plan: Return in about 4 weeks (around 09/17/2018). Rex Kras, MD [facility]

## 2018-08-20 NOTE — Patient Instructions (Signed)
Virtual Behavioral Health Contact: [phone] [place] 

## 2018-08-21 ENCOUNTER — Telehealth: Payer: Self-pay

## 2018-08-21 NOTE — Telephone Encounter (Signed)
Left Message - [facility]  

## 2018-08-24 ENCOUNTER — Telehealth: Payer: Self-pay

## 2018-08-24 NOTE — Telephone Encounter (Signed)
2nd attempt - [facility].  Information routed to the PCP and Dr Vanetta Shawl.

## 2018-08-26 ENCOUNTER — Ambulatory Visit: Payer: Medicaid Other | Admitting: Gastroenterology

## 2018-08-27 ENCOUNTER — Telehealth: Payer: Self-pay

## 2018-08-27 DIAGNOSIS — F339 Major depressive disorder, recurrent, unspecified: Secondary | ICD-10-CM
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
## 2018-09-24 ENCOUNTER — Telehealth: Payer: Self-pay

## 2018-09-24 ENCOUNTER — Ambulatory Visit: Payer: Medicaid Other | Admitting: Pediatrics

## 2018-09-24 NOTE — Telephone Encounter (Signed)
[facility] - Left Message  

## 2018-10-06 ENCOUNTER — Telehealth: Payer: Self-pay

## 2018-10-06 NOTE — Telephone Encounter (Signed)
2nd attempt - [facility]   

## 2018-10-07 DIAGNOSIS — G43019 Migraine without aura, intractable, without status migrainosus: Secondary | ICD-10-CM | POA: Diagnosis not present

## 2018-10-07 DIAGNOSIS — F331 Major depressive disorder, recurrent, moderate: Secondary | ICD-10-CM | POA: Diagnosis not present

## 2018-10-07 DIAGNOSIS — G40219 Localization-related (focal) (partial) symptomatic epilepsy and epileptic syndromes with complex partial seizures, intractable, without status epilepticus: Secondary | ICD-10-CM | POA: Diagnosis not present

## 2018-10-07 DIAGNOSIS — G47419 Narcolepsy without cataplexy: Secondary | ICD-10-CM | POA: Diagnosis not present

## 2018-10-08 ENCOUNTER — Encounter: Payer: Self-pay | Admitting: Family Medicine

## 2018-10-08 ENCOUNTER — Ambulatory Visit: Payer: Medicaid Other | Admitting: Family Medicine

## 2018-10-08 VITALS — BP 177/94 | HR 70 | Temp 99.0°F | Ht 69.0 in | Wt 206.0 lb

## 2018-10-08 DIAGNOSIS — R3915 Urgency of urination: Secondary | ICD-10-CM | POA: Diagnosis not present

## 2018-10-08 DIAGNOSIS — E669 Obesity, unspecified: Secondary | ICD-10-CM

## 2018-10-08 DIAGNOSIS — E039 Hypothyroidism, unspecified: Secondary | ICD-10-CM

## 2018-10-08 DIAGNOSIS — I10 Essential (primary) hypertension: Secondary | ICD-10-CM | POA: Diagnosis not present

## 2018-10-08 DIAGNOSIS — Z6830 Body mass index (BMI) 30.0-30.9, adult: Secondary | ICD-10-CM | POA: Diagnosis not present

## 2018-10-08 LAB — URINALYSIS, COMPLETE
Bilirubin, UA: NEGATIVE
Glucose, UA: NEGATIVE
NITRITE UA: NEGATIVE
Protein, UA: NEGATIVE
RBC, UA: NEGATIVE
Urobilinogen, Ur: 0.2 mg/dL (ref 0.2–1.0)
pH, UA: 5.5 (ref 5.0–7.5)

## 2018-10-08 LAB — MICROSCOPIC EXAMINATION
Epithelial Cells (non renal): >10 /hpf — AB (ref 0–10)
Renal Epithel, UA: NONE SEEN /hpf

## 2018-10-08 LAB — BAYER DCA HB A1C WAIVED: HB A1C (BAYER DCA - WAIVED): 5.3 % (ref <7.0)

## 2018-10-08 MED ORDER — LISINOPRIL 10 MG PO TABS: 10.0000 mg | ORAL_TABLET | Freq: Every day | ORAL | 1 refills | Status: DC

## 2018-10-08 NOTE — Patient Instructions (Signed)
You had labs performed today.  You will be contacted with the results of the labs once they are available, usually in the next 3 business days for routine lab work.  If you had a pap smear or biopsy performed, expect to be contacted in about 7-10 days.  We will see each other in 1 week for blood pressure recheck and fasting labs.  Your goal blood pressure is less than 140/90.  Get a blood pressure monitor and check blood pressures at home once daily.  I have given instructions below on how to do this.  DASH diet also included.  I have referred you to dietician as well.   How to Take Your Blood Pressure You can take your blood pressure at home with a machine. You may need to check your blood pressure at home:  To check if you have high blood pressure (hypertension).  To check your blood pressure over time.  To make sure your blood pressure medicine is working. Supplies needed: You will need a blood pressure machine, or monitor. You can buy one at a drugstore or online. When choosing one:  Choose one with an arm cuff.  Choose one that wraps around your upper arm. Only one finger should fit between your arm and the cuff.  Do not choose one that measures your blood pressure from your wrist or finger. Your doctor can suggest a monitor. How to prepare Avoid these things for 30 minutes before checking your blood pressure:  Drinking caffeine.  Drinking alcohol.  Eating.  Smoking.  Exercising. Five minutes before checking your blood pressure:  Pee.  Sit in a dining chair. Avoid sitting in a soft couch or armchair.  Be quiet. Do not talk. How to take your blood pressure Follow the instructions that came with your machine. If you have a digital blood pressure monitor, these may be the instructions: 1. Sit up straight. 2. Place your feet on the floor. Do not cross your ankles or legs. 3. Rest your left arm at the level of your heart. You may rest it on a table, desk, or  chair. 4. Pull up your shirt sleeve. 5. Wrap the blood pressure cuff around the upper part of your left arm. The cuff should be 1 inch (2.5 cm) above your elbow. It is best to wrap the cuff around bare skin. 6. Fit the cuff snugly around your arm. You should be able to place only one finger between the cuff and your arm. 7. Put the cord inside the groove of your elbow. 8. Press the power button. 9. Sit quietly while the cuff fills with air and loses air. 10. Write down the numbers on the screen. 11. Wait 2-3 minutes and then repeat steps 1-10. What do the numbers mean? Two numbers make up your blood pressure. The first number is called systolic pressure. The second is called diastolic pressure. An example of a blood pressure reading is "120 over 80" (or 120/80). If you are an adult and do not have a medical condition, use this guide to find out if your blood pressure is normal: Normal  First number: below 120.  Second number: below 80. Elevated  First number: 120-129.  Second number: below 80. Hypertension stage 1  First number: 130-139.  Second number: 80-89. Hypertension stage 2  First number: 140 or above.  Second number: 90 or above. Your blood pressure is above normal even if only the top or bottom number is above normal. Follow these instructions  at home:  Check your blood pressure as often as your doctor tells you to.  Take your monitor to your next doctor's appointment. Your doctor will: ? Make sure you are using it correctly. ? Make sure it is working right.  Make sure you understand what your blood pressure numbers should be.  Tell your doctor if your medicines are causing side effects. Contact a doctor if:  Your blood pressure keeps being high. Get help right away if:  Your first blood pressure number is higher than 180.  Your second blood pressure number is higher than 120. This information is not intended to replace advice given to you by your health  care provider. Make sure you discuss any questions you have with your health care provider. Document Released: 08/15/2008 Document Revised: 07/31/2016 Document Reviewed: 02/09/2016 Elsevier Interactive Patient Education  2019 [employer] Inc.  DASH Eating Plan DASH stands for "Dietary Approaches to Stop Hypertension." The DASH eating plan is a healthy eating plan that has been shown to reduce high blood pressure (hypertension). It may also reduce your risk for type 2 diabetes, heart disease, and stroke. The DASH eating plan may also help with weight loss. What are tips for following this plan?  General guidelines  Avoid eating more than 2,300 mg (milligrams) of salt (sodium) a day. If you have hypertension, you may need to reduce your sodium intake to 1,500 mg a day.  Limit alcohol intake to no more than 1 drink a day for nonpregnant women and 2 drinks a day for men. One drink equals 12 oz of beer, 5 oz of wine, or 1 oz of hard liquor.  Work with your health care provider to maintain a healthy body weight or to lose weight. Ask what an ideal weight is for you.  Get at least 30 minutes of exercise that causes your heart to beat faster (aerobic exercise) most days of the week. Activities may include walking, swimming, or biking.  Work with your health care provider or diet and nutrition specialist (dietitian) to adjust your eating plan to your individual calorie needs. Reading food labels   Check food labels for the amount of sodium per serving. Choose foods with less than 5 percent of the Daily Value of sodium. Generally, foods with less than 300 mg of sodium per serving fit into this eating plan.  To find whole grains, look for the word "whole" as the first word in the ingredient list. Shopping  Buy products labeled as "low-sodium" or "no salt added."  Buy fresh foods. Avoid canned foods and premade or frozen meals. Cooking  Avoid adding salt when cooking. Use salt-free seasonings or  herbs instead of table salt or sea salt. Check with your health care provider or pharmacist before using salt substitutes.  Do not fry foods. Cook foods using healthy methods such as baking, boiling, grilling, and broiling instead.  Cook with heart-healthy oils, such as olive, canola, soybean, or sunflower oil. Meal planning  Eat a balanced diet that includes: ? 5 or more servings of fruits and vegetables each day. At each meal, try to fill half of your plate with fruits and vegetables. ? Up to 6-8 servings of whole grains each day. ? Less than 6 oz of lean meat, poultry, or fish each day. A 3-oz serving of meat is about the same size as a deck of cards. One egg equals 1 oz. ? 2 servings of low-fat dairy each day. ? A serving of nuts, seeds, or beans  5 times each week. ? Heart-healthy fats. Healthy fats called Omega-3 fatty acids are found in foods such as flaxseeds and coldwater fish, like sardines, salmon, and mackerel.  Limit how much you eat of the following: ? Canned or prepackaged foods. ? Food that is high in trans fat, such as fried foods. ? Food that is high in saturated fat, such as fatty meat. ? Sweets, desserts, sugary drinks, and other foods with added sugar. ? Full-fat dairy products.  Do not salt foods before eating.  Try to eat at least 2 vegetarian meals each week.  Eat more home-cooked food and less restaurant, buffet, and fast food.  When eating at a restaurant, ask that your food be prepared with less salt or no salt, if possible. What foods are recommended? The items listed may not be a complete list. Talk with your dietitian about what dietary choices are best for you. Grains Whole-grain or whole-wheat bread. Whole-grain or whole-wheat pasta. Brown rice. Orpah Cobb. Bulgur. Whole-grain and low-sodium cereals. Pita bread. Low-fat, low-sodium crackers. Whole-wheat flour tortillas. Vegetables Fresh or frozen vegetables (raw, steamed, roasted, or grilled).  Low-sodium or reduced-sodium tomato and vegetable juice. Low-sodium or reduced-sodium tomato sauce and tomato paste. Low-sodium or reduced-sodium canned vegetables. Fruits All fresh, dried, or frozen fruit. Canned fruit in natural juice (without added sugar). Meat and other protein foods Skinless chicken or [place]. Ground chicken or [place]. Pork with fat trimmed off. Fish and seafood. Egg whites. Dried beans, peas, or lentils. Unsalted nuts, nut butters, and seeds. Unsalted canned beans. Lean cuts of beef with fat trimmed off. Low-sodium, lean deli meat. Dairy Low-fat (1%) or fat-free (skim) milk. Fat-free, low-fat, or reduced-fat cheeses. Nonfat, low-sodium ricotta or cottage cheese. Low-fat or nonfat yogurt. Low-fat, low-sodium cheese. Fats and oils Soft margarine without trans fats. Vegetable oil. Low-fat, reduced-fat, or light mayonnaise and salad dressings (reduced-sodium). Canola, safflower, olive, soybean, and sunflower oils. Avocado. Seasoning and other foods Herbs. Spices. Seasoning mixes without salt. Unsalted popcorn and pretzels. Fat-free sweets. What foods are not recommended? The items listed may not be a complete list. Talk with your dietitian about what dietary choices are best for you. Grains Baked goods made with fat, such as croissants, muffins, or some breads. Dry pasta or rice meal packs. Vegetables Creamed or fried vegetables. Vegetables in a cheese sauce. Regular canned vegetables (not low-sodium or reduced-sodium). Regular canned tomato sauce and paste (not low-sodium or reduced-sodium). Regular tomato and vegetable juice (not low-sodium or reduced-sodium). Rosita Fire. Olives. Fruits Canned fruit in a light or heavy syrup. Fried fruit. Fruit in cream or butter sauce. Meat and other protein foods Fatty cuts of meat. Ribs. Fried meat. Tomasa Blase. Sausage. Bologna and other processed lunch meats. Salami. Fatback. Hotdogs. Bratwurst. Salted nuts and seeds. Canned beans with added  salt. Canned or smoked fish. Whole eggs or egg yolks. Chicken or [place] with skin. Dairy Whole or 2% milk, cream, and half-and-half. Whole or full-fat cream cheese. Whole-fat or sweetened yogurt. Full-fat cheese. Nondairy creamers. Whipped toppings. Processed cheese and cheese spreads. Fats and oils Butter. Stick margarine. Lard. Shortening. Ghee. Bacon fat. Tropical oils, such as coconut, palm kernel, or palm oil. Seasoning and other foods Salted popcorn and pretzels. Onion salt, garlic salt, seasoned salt, table salt, and sea salt. Worcestershire sauce. Tartar sauce. Barbecue sauce. Teriyaki sauce. Soy sauce, including reduced-sodium. Steak sauce. Canned and packaged gravies. Fish sauce. Oyster sauce. Cocktail sauce. Horseradish that you find on the shelf. Ketchup. Mustard. Meat flavorings and tenderizers. Bouillon cubes.  Hot sauce and Tabasco sauce. Premade or packaged marinades. Premade or packaged taco seasonings. Relishes. Regular salad dressings. Where to find more information:  National Heart, Lung, and Blood Institute: PopSteam.iswww.nhlbi.nih.gov  American Heart Association: www.heart.org Summary  The DASH eating plan is a healthy eating plan that has been shown to reduce high blood pressure (hypertension). It may also reduce your risk for type 2 diabetes, heart disease, and stroke.  With the DASH eating plan, you should limit salt (sodium) intake to 2,300 mg a day. If you have hypertension, you may need to reduce your sodium intake to 1,500 mg a day.  When on the DASH eating plan, aim to eat more fresh fruits and vegetables, whole grains, lean proteins, low-fat dairy, and heart-healthy fats.  Work with your health care provider or diet and nutrition specialist (dietitian) to adjust your eating plan to your individual calorie needs. This information is not intended to replace advice given to you by your health care provider. Make sure you discuss any questions you have with your health care  provider. Document Released: 08/22/2011 Document Revised: 08/26/2016 Document Reviewed: 08/26/2016 Elsevier Interactive Patient Education  2019 [employer]

## 2018-10-08 NOTE — Progress Notes (Signed)
Subjective: CC: HTN PCP: Janora Norlander, DO KGM:WNUUV Colleen Sellers is a 53 y.o. female presenting to clinic today for:  1. HTN Patient notes persistently elevated blood pressures.  She was seen at her neurologist yesterday and was noted to have blood pressures in the 170s over 90s.  She does not check blood pressures at home but does note a significant family history of cardiovascular disease in her brother and father, both whom have died of massive heart attacks.  Her brother was 36, her father was in his 63s.  She denies any chest pain but does note occasional shortness of breath that is been ongoing for about a month now.  Additionally, she reports lower extremity edema that occurs if she is on her feet for long standing periods.  She has tried some diet modification by eating "tunafish sandwiches".  Though she does report intake of moderate amounts of salt.  She goes on to state that her urine has been dark and that she has had some urgency.  No fevers.  2.  Hypothyroidism Patient reports diagnosis of hypothyroidism earlier last year.  She has been on her Synthroid since that time.  She continues to have chronic fatigue.  She is had hair loss.   ROS: Per HPI  Allergies  Allergen Reactions  . Ceftin Other (See Comments)    Unknown  . Codeine Nausea And Vomiting  . Morphine Hives and Itching  . Adhesive [Tape] Rash  . Latex Rash   Past Medical History:  Diagnosis Date  . Chest pain    Dyspnea, diaphoresis  . CHF (congestive heart failure) ([place])   . Chronic back pain   . Degenerative joint disease    Also scoliosis and pectus deformity  . Depression   . Fibromyalgia   . IBS (irritable bowel syndrome)    Mild hematochezia; negative for celiac disease; mild Schatzki's ring; small hiatal hernia; minor gastric erosions; few diverticula  . MS (multiple sclerosis) ([place])   . Overweight(278.02)    Negative sleep study-2010, but treated for narcolepsy  . Seizure disorder ([place])   .  Surgical menopause 2002   age 66    Current Outpatient Medications:  .  amphetamine-dextroamphetamine (ADDERALL) 15 MG tablet, Take 1 tablet by mouth 2 (two) times daily., Disp: , Rfl: 0 .  B-Colleen 3CC LUER-LOK SYR 25GX1" 25G X 1" 3 ML MISC, USE ONE SYRINGE TO INJECT B-12 TWICE A MONTH., Disp: , Rfl: 12 .  cyanocobalamin (,VITAMIN B-12,) 1000 MCG/ML injection, INJECT 1 ML INTRAMUSCULARLY TWICE A MONTH, Disp: , Rfl: 1 .  meloxicam (MOBIC) 15 MG tablet, TAKE 1 TABLET ORALLY EVERY DAY FOR ARTHRITIS, Disp: , Rfl: 5 .  oxyCODONE (OXY IR/ROXICODONE) 5 MG immediate release tablet, Take 5 mg by mouth every 4 (four) hours as needed for severe pain., Disp: , Rfl:  .  PROVENTIL HFA 108 (90 Base) MCG/ACT inhaler, INHALE 2 PUFFS INTO THE LUNGS EVERY 6 (SIX) HOURS AS NEEDED FOR WHEEZING OR SHORTNESS OF BREATH., Disp: 6.7 Inhaler, Rfl: 4 .  SUMAtriptan (IMITREX) 100 MG tablet, TAKE 1 TABLET ORALLY AS NEEDED, Disp: 10 tablet, Rfl: 5 .  escitalopram (LEXAPRO) 20 MG tablet, Take 1 tablet (20 mg total) by mouth daily. (Patient not taking: Reported on 10/08/2018), Disp: 90 tablet, Rfl: 1 .  fexofenadine (ALLEGRA ALLERGY) 180 MG tablet, Take 1 tablet (180 mg total) by mouth daily. (Patient not taking: Reported on 10/08/2018), Disp: 30 tablet, Rfl: 11 .  fluticasone (FLONASE) 50 MCG/ACT nasal  spray, Place 2 sprays into both nostrils daily. (Patient not taking: Reported on 10/08/2018), Disp: 16 g, Rfl: 6 .  levothyroxine (SYNTHROID, LEVOTHROID) 50 MCG tablet, Take 1 tablet (50 mcg total) by mouth daily. (Patient not taking: Reported on 10/08/2018), Disp: 90 tablet, Rfl: 1 .  linaclotide (LINZESS) 145 MCG CAPS capsule, Take 1 capsule (145 mcg total) by mouth daily before breakfast. (Patient not taking: Reported on 10/08/2018), Disp: 30 capsule, Rfl: 3 Social History   Socioeconomic History  . Marital status: Divorced    Spouse name: Not on file  . Number of children: 4  . Years of education: Not on file  . Highest education  level: Not on file  Occupational History  . Occupation: Disabled    Comment: Fibromyalgia, arthritis  Social Needs  . Financial resource strain: Not on file  . Food insecurity:    Worry: Not on file    Inability: Not on file  . Transportation needs:    Medical: Not on file    Non-medical: Not on file  Tobacco Use  . Smoking status: Never Smoker  . Smokeless tobacco: Never Used  Substance and Sexual Activity  . Alcohol use: No    Alcohol/week: 0.0 standard drinks    Comment: minimal use  . Drug use: No  . Sexual activity: Not on file  Lifestyle  . Physical activity:    Days per week: Not on file    Minutes per session: Not on file  . Stress: Not on file  Relationships  . Social connections:    Talks on phone: Not on file    Gets together: Not on file    Attends religious service: Not on file    Active member of club or organization: Not on file    Attends meetings of clubs or organizations: Not on file    Relationship status: Not on file  . Intimate partner violence:    Fear of current or ex partner: Not on file    Emotionally abused: Not on file    Physically abused: Not on file    Forced sexual activity: Not on file  Other Topics Concern  . Not on file  Social History Narrative  . Not on file   Family History  Problem Relation Age of Onset  . Depression Mother        suicide  . Alcohol abuse Father   . Coronary artery disease Father     Objective: Office vital signs reviewed. BP (!) 177/94   Pulse 70   Temp 99 F (37.2 C) (Oral)   Ht '5\' 9"'  (1.753 m)   Wt 206 lb (93.4 kg)   LMP 09/16/1994   BMI 30.42 kg/m   Physical Examination:  General: Awake, alert, well nourished, No acute distress HEENT: Normal, sclera white.  No exophthalmos    Neck: No goiter Cardio: regular rate and rhythm, S1S2 heard, no murmurs appreciated Pulm: clear to auscultation bilaterally, no wheezes, rhonchi or rales; normal work of breathing on room air Extremities: warm, well  perfused, No edema, cyanosis or clubbing; +2 pulses bilaterally Neuro: no tremor  Assessment/ Plan: 53 y.o. female   1. Accelerated hypertension Blood pressure elevated.  I reviewed her last renal panel which did demonstrate an elevated creatinine.  She is on oral NSAIDs.  Recheck CMP.  I placed a referral to nutrition as well for further counseling on dietary change and lifestyle modification.  Start lisinopril 64m daily. - CMP14+EGFR - Bayer DCA Hb A1c Waived -  Amb ref to Medical Nutrition Therapy-[facility]  2. Acquired hypothyroidism Chronic fatigue.  Check thyroid panel. - Thyroid Panel With TSH - Bayer DCA Hb A1c Waived  3. Urinary urgency Check for urinary tract infection. - CBC - Urinalysis, Complete  4. Class 1 obesity with serious comorbidity and body mass index (BMI) of 30.0 to 30.9 in adult, unspecified obesity type - Amb ref to Medical Nutrition Therapy-[facility]   Orders Placed This Encounter  Procedures  . CMP14+EGFR  . Thyroid Panel With TSH  . CBC  . Urinalysis, Complete  . Bayer DCA Hb A1c Waived  . Amb ref to Medical Nutrition Therapy-[facility]    Referral Priority:   Routine    Referral Type:   Consultation    Referral Reason:   Specialty Services Required    Requested Specialty:   Nutrition    Number of Visits Requested:   1   Meds ordered this encounter  Medications  . lisinopril (PRINIVIL,ZESTRIL) 10 MG tablet    Sig: Take 1 tablet (10 mg total) by mouth daily.    Dispense:  30 tablet    Refill:  [place], DO [place] [phone]

## 2018-10-09 ENCOUNTER — Other Ambulatory Visit: Payer: Self-pay | Admitting: Family Medicine

## 2018-10-09 LAB — CBC
HEMATOCRIT: 38.1 % (ref 34.0–46.6)
HEMOGLOBIN: 13.2 g/dL (ref 11.1–15.9)
MCH: 30.3 pg (ref 26.6–33.0)
MCHC: 34.6 g/dL (ref 31.5–35.7)
MCV: 87 fL (ref 79–97)
Platelets: 372 10*3/uL (ref 150–450)
RBC: 4.36 x10E6/uL (ref 3.77–5.28)
RDW: 11.6 % — ABNORMAL LOW (ref 11.7–15.4)
WBC: 6.7 10*3/uL (ref 3.4–10.8)

## 2018-10-09 LAB — CMP14+EGFR
ALT: 14 IU/L (ref 0–32)
AST: 14 IU/L (ref 0–40)
Albumin/Globulin Ratio: 1.7 (ref 1.2–2.2)
Albumin: 4.3 g/dL (ref 3.8–4.9)
Alkaline Phosphatase: 81 IU/L (ref 39–117)
BUN / CREAT RATIO: 16 (ref 9–23)
BUN: 15 mg/dL (ref 6–24)
Bilirubin Total: 0.4 mg/dL (ref 0.0–1.2)
CALCIUM: 9.5 mg/dL (ref 8.7–10.2)
CO2: 24 mmol/L (ref 20–29)
CREATININE: 0.91 mg/dL (ref 0.57–1.00)
Chloride: 102 mmol/L (ref 96–106)
GFR calc non Af Amer: 72 mL/min/{1.73_m2} (ref >59)
GFR, EST AFRICAN AMERICAN: 83 mL/min/{1.73_m2} (ref >59)
Globulin, Total: 2.5 g/dL (ref 1.5–4.5)
Glucose: 96 mg/dL (ref 65–99)
Potassium: 4 mmol/L (ref 3.5–5.2)
Sodium: 143 mmol/L (ref 134–144)
TOTAL PROTEIN: 6.8 g/dL (ref 6.0–8.5)

## 2018-10-09 LAB — THYROID PANEL WITH TSH
FREE THYROXINE INDEX: 1.9 (ref 1.2–4.9)
T3 UPTAKE RATIO: 25 % (ref 24–39)
T4, Total: 7.5 ug/dL (ref 4.5–12.0)
TSH: 4.86 u[IU]/mL — ABNORMAL HIGH (ref 0.450–4.500)

## 2018-10-09 MED ORDER — NITROFURANTOIN MONOHYD MACRO 100 MG PO CAPS: 100.0000 mg | ORAL_CAPSULE | Freq: Two times a day (BID) | ORAL | 0 refills | Status: AC

## 2018-10-09 MED ORDER — LEVOTHYROXINE SODIUM 75 MCG PO TABS: 75.0000 ug | ORAL_TABLET | Freq: Every day | ORAL | 1 refills | Status: DC

## 2018-10-16 ENCOUNTER — Ambulatory Visit: Payer: Medicaid Other | Admitting: Family Medicine

## 2018-10-16 ENCOUNTER — Encounter: Payer: Self-pay | Admitting: Family Medicine

## 2018-10-16 VITALS — BP 121/73 | HR 64 | Temp 98.1°F | Ht 69.0 in | Wt 204.0 lb

## 2018-10-16 DIAGNOSIS — I10 Essential (primary) hypertension: Secondary | ICD-10-CM | POA: Insufficient documentation

## 2018-10-16 DIAGNOSIS — E039 Hypothyroidism, unspecified: Secondary | ICD-10-CM | POA: Diagnosis not present

## 2018-10-16 NOTE — Progress Notes (Signed)
Subjective: CC: HTN PCP: Raliegh Ip, DO BVQ:XIHWT D Colleen Sellers is a 53 y.o. female presenting to clinic today for:  1. HTN Patient here for one-week follow-up on hypertension.  She was started on lisinopril 10 mg 1 week ago.  She is monitoring blood pressures daily and notes that initially blood pressures remain somewhat elevated for the first 24 hours but subsequent blood pressures have been around 120s over 70s to 80s.  Denies any chest pain.  She does occasionally have shortness of breath with lying down but she feels that this is due to the structure of her chest.  Denies any lower extremity swelling.  Overall, she has been feeling better.  2.  Hypothyroidism Patient reports diagnosis of hypothyroidism earlier last year.  She does report some improvement in fatigue with increased dose of Synthroid.  She is currently taking 75 mcg daily.  No diarrhea.   ROS: Per HPI  Allergies  Allergen Reactions  . Ceftin Other (See Comments)    Unknown  . Codeine Nausea And Vomiting  . Morphine Hives and Itching  . Adhesive [Tape] Rash  . Latex Rash   Past Medical History:  Diagnosis Date  . Chest pain    Dyspnea, diaphoresis  . CHF (congestive heart failure) (HCC)   . Chronic back pain   . Degenerative joint disease    Also scoliosis and pectus deformity  . Depression   . Fibromyalgia   . IBS (irritable bowel syndrome)    Mild hematochezia; negative for celiac disease; mild Schatzki's ring; small hiatal hernia; minor gastric erosions; few diverticula  . MS (multiple sclerosis) (HCC)   . Overweight(278.02)    Negative sleep study-2010, but treated for narcolepsy  . Seizure disorder (HCC)   . Surgical menopause 2002   age 49    Current Outpatient Medications:  .  amphetamine-dextroamphetamine (ADDERALL) 15 MG tablet, Take 1 tablet by mouth 2 (two) times daily., Disp: , Rfl: 0 .  B-D 3CC LUER-LOK SYR 25GX1" 25G X 1" 3 ML MISC, USE ONE SYRINGE TO INJECT B-12 TWICE A MONTH.,  Disp: , Rfl: 12 .  cyanocobalamin (,VITAMIN B-12,) 1000 MCG/ML injection, INJECT 1 ML INTRAMUSCULARLY TWICE A MONTH, Disp: , Rfl: 1 .  escitalopram (LEXAPRO) 20 MG tablet, Take 1 tablet (20 mg total) by mouth daily. (Patient not taking: Reported on 10/08/2018), Disp: 90 tablet, Rfl: 1 .  fexofenadine (ALLEGRA ALLERGY) 180 MG tablet, Take 1 tablet (180 mg total) by mouth daily. (Patient not taking: Reported on 10/08/2018), Disp: 30 tablet, Rfl: 11 .  fluticasone (FLONASE) 50 MCG/ACT nasal spray, Place 2 sprays into both nostrils daily. (Patient not taking: Reported on 10/08/2018), Disp: 16 g, Rfl: 6 .  levothyroxine (SYNTHROID, LEVOTHROID) 75 MCG tablet, Take 1 tablet (75 mcg total) by mouth daily., Disp: 30 tablet, Rfl: 1 .  linaclotide (LINZESS) 145 MCG CAPS capsule, Take 1 capsule (145 mcg total) by mouth daily before breakfast. (Patient not taking: Reported on 10/08/2018), Disp: 30 capsule, Rfl: 3 .  lisinopril (PRINIVIL,ZESTRIL) 10 MG tablet, Take 1 tablet (10 mg total) by mouth daily., Disp: 30 tablet, Rfl: 1 .  meloxicam (MOBIC) 15 MG tablet, TAKE 1 TABLET ORALLY EVERY DAY FOR ARTHRITIS, Disp: , Rfl: 5 .  oxyCODONE (OXY IR/ROXICODONE) 5 MG immediate release tablet, Take 5 mg by mouth every 4 (four) hours as needed for severe pain., Disp: , Rfl:  .  PROVENTIL HFA 108 (90 Base) MCG/ACT inhaler, INHALE 2 PUFFS INTO THE LUNGS EVERY 6 (SIX)  HOURS AS NEEDED FOR WHEEZING OR SHORTNESS OF BREATH., Disp: 6.7 Inhaler, Rfl: 4 .  SUMAtriptan (IMITREX) 100 MG tablet, TAKE 1 TABLET ORALLY AS NEEDED, Disp: 10 tablet, Rfl: 5 Social History   Socioeconomic History  . Marital status: Divorced    Spouse name: Not on file  . Number of children: 4  . Years of education: Not on file  . Highest education level: Not on file  Occupational History  . Occupation: Disabled    Comment: Fibromyalgia, arthritis  Social Needs  . Financial resource strain: Not on file  . Food insecurity:    Worry: Not on file     Inability: Not on file  . Transportation needs:    Medical: Not on file    Non-medical: Not on file  Tobacco Use  . Smoking status: Never Smoker  . Smokeless tobacco: Never Used  Substance and Sexual Activity  . Alcohol use: No    Alcohol/week: 0.0 standard drinks    Comment: minimal use  . Drug use: No  . Sexual activity: Not on file  Lifestyle  . Physical activity:    Days per week: Not on file    Minutes per session: Not on file  . Stress: Not on file  Relationships  . Social connections:    Talks on phone: Not on file    Gets together: Not on file    Attends religious service: Not on file    Active member of club or organization: Not on file    Attends meetings of clubs or organizations: Not on file    Relationship status: Not on file  . Intimate partner violence:    Fear of current or ex partner: Not on file    Emotionally abused: Not on file    Physically abused: Not on file    Forced sexual activity: Not on file  Other Topics Concern  . Not on file  Social History Narrative  . Not on file   Family History  Problem Relation Age of Onset  . Depression Mother        suicide  . Alcohol abuse Father   . Coronary artery disease Father     Objective: Office vital signs reviewed. BP 121/73   Pulse 64   Temp 98.1 F (36.7 C) (Oral)   Ht 5\' 9"  (1.753 m)   Wt 204 lb (92.5 kg)   LMP 09/16/1994   BMI 30.13 kg/m   Physical Examination:  General: Awake, alert, well nourished, No acute distress HEENT: Normal, sclera white.  No exophthalmos    Neck: No goiter Cardio: regular rate and rhythm, S1S2 heard, no murmurs appreciated Pulm: clear to auscultation bilaterally, no wheezes, rhonchi or rales; normal work of breathing on room air Extremities: warm, well perfused, No edema, cyanosis or clubbing; +2 pulses bilaterally  Assessment/ Plan: 53 y.o. female   1. Essential hypertension Blood pressure under excellent control with lisinopril 10 mg daily.  Check BMP to  ensure stability of renal function. - Basic Metabolic Panel  2. Acquired hypothyroidism Doing well on increased dose of Synthroid 75 mcg.  We will plan to recheck this level in 6 to 8 weeks.    Orders Placed This Encounter  Procedures  . Basic Metabolic Panel   No orders of the defined types were placed in this encounter.    Raliegh Ip, DO [facility] [phone]

## 2018-10-16 NOTE — Patient Instructions (Signed)
We are rechecking your kidney function.  Your blood pressure looks fantastic.  We will plan to recheck thyroid function in 6-8 weeks

## 2018-10-17 ENCOUNTER — Other Ambulatory Visit: Payer: Self-pay | Admitting: Pediatrics

## 2018-10-17 DIAGNOSIS — F339 Major depressive disorder, recurrent, unspecified: Secondary | ICD-10-CM

## 2018-10-17 LAB — BASIC METABOLIC PANEL
BUN/Creatinine Ratio: 13 (ref 9–23)
BUN: 11 mg/dL (ref 6–24)
CO2: 25 mmol/L (ref 20–29)
Calcium: 9.6 mg/dL (ref 8.7–10.2)
Chloride: 103 mmol/L (ref 96–106)
Creatinine, Ser: 0.86 mg/dL (ref 0.57–1.00)
GFR calc Af Amer: 89 mL/min/{1.73_m2} (ref >59)
GFR, EST NON AFRICAN AMERICAN: 77 mL/min/{1.73_m2} (ref >59)
GLUCOSE: 91 mg/dL (ref 65–99)
POTASSIUM: 4.2 mmol/L (ref 3.5–5.2)
SODIUM: 143 mmol/L (ref 134–144)

## 2018-10-20 ENCOUNTER — Telehealth: Payer: Self-pay

## 2018-10-20 NOTE — Telephone Encounter (Signed)
3rd attempt Left Message

## 2018-10-30 ENCOUNTER — Other Ambulatory Visit: Payer: Self-pay | Admitting: Family Medicine

## 2018-10-30 ENCOUNTER — Telehealth: Payer: Self-pay

## 2018-10-30 NOTE — Telephone Encounter (Signed)
Several attempts have been made to contact patient without success. Patient will be placed on the inactive list.  If services are needed again.  Please contact [facility] at [phone].    Information will be routed to the PCP and Dr. Hisada  

## 2018-11-04 ENCOUNTER — Other Ambulatory Visit: Payer: Self-pay | Admitting: Family Medicine

## 2018-11-18 DIAGNOSIS — M7061 Trochanteric bursitis, right hip: Secondary | ICD-10-CM | POA: Diagnosis not present

## 2018-11-18 DIAGNOSIS — M7062 Trochanteric bursitis, left hip: Secondary | ICD-10-CM | POA: Diagnosis not present

## 2018-11-18 DIAGNOSIS — G894 Chronic pain syndrome: Secondary | ICD-10-CM | POA: Diagnosis not present

## 2018-11-18 DIAGNOSIS — M533 Sacrococcygeal disorders, not elsewhere classified: Secondary | ICD-10-CM | POA: Diagnosis not present

## 2018-11-23 ENCOUNTER — Other Ambulatory Visit: Payer: Self-pay | Admitting: Family Medicine

## 2018-11-27 ENCOUNTER — Encounter: Payer: Self-pay | Admitting: Family Medicine

## 2018-11-27 ENCOUNTER — Ambulatory Visit: Payer: Medicaid Other | Admitting: Family Medicine

## 2018-11-27 ENCOUNTER — Other Ambulatory Visit: Payer: Self-pay

## 2018-11-27 VITALS — BP 124/76 | HR 66 | Temp 98.1°F | Ht 69.0 in | Wt 205.0 lb

## 2018-11-27 DIAGNOSIS — E039 Hypothyroidism, unspecified: Secondary | ICD-10-CM

## 2018-11-27 DIAGNOSIS — R0989 Other specified symptoms and signs involving the circulatory and respiratory systems: Secondary | ICD-10-CM | POA: Diagnosis not present

## 2018-11-27 NOTE — Progress Notes (Signed)
Subjective: CC: Hypothyroidism f/u PCP: Colleen Ip, DO XIP:JASNK D Colleen is a 53 y.o. female presenting to clinic today for:  1.  Hypothyroidism History: Abnormal thyroid levels noted since 2018.  No history of surgery or radiation to the neck.  Energy continues to improve on Synthroid 75 mcg daily.  She does report occasional globus sensation and sometimes has choking episodes on various foods and drink.  This is been ongoing for many years.  Denies any diarrhea but does report chronic constipation.  She has not yet used the Linzess that was given to her previously.  She is on an opioid medication.  No tremors, heart palpitations, insomnia.  Hair thinning seems to be improving.  She continues to be heat intolerant.  No change in voice, vision.  No use of biotin or hair skin and nail supplements   ROS: Per HPI  Allergies  Allergen Reactions  . Ceftin Other (See Comments)    Unknown  . Codeine Nausea And Vomiting  . Morphine Hives and Itching  . Adhesive [Tape] Rash  . Latex Rash   Past Medical History:  Diagnosis Date  . Chest pain    Dyspnea, diaphoresis  . CHF (congestive heart failure) (HCC)   . Chronic back pain   . Degenerative joint disease    Also scoliosis and pectus deformity  . Depression   . Fibromyalgia   . IBS (irritable bowel syndrome)    Mild hematochezia; negative for celiac disease; mild Schatzki's ring; small hiatal hernia; minor gastric erosions; few diverticula  . MS (multiple sclerosis) (HCC)   . Overweight(278.02)    Negative sleep study-2010, but treated for narcolepsy  . Seizure disorder (HCC)   . Surgical menopause 2002   age 59    Current Outpatient Medications:  .  amphetamine-dextroamphetamine (ADDERALL) 15 MG tablet, Take 1 tablet by mouth 2 (two) times daily., Disp: , Rfl: 0 .  levothyroxine (SYNTHROID, LEVOTHROID) 75 MCG tablet, Take 1 tablet (75 mcg total) by mouth daily., Disp: 30 tablet, Rfl: 1 .  lisinopril  (PRINIVIL,ZESTRIL) 10 MG tablet, TAKE 1 TABLET BY MOUTH EVERY DAY, Disp: 30 tablet, Rfl: 3 .  oxyCODONE (OXY IR/ROXICODONE) 5 MG immediate release tablet, Take 5 mg by mouth every 4 (four) hours as needed for severe pain., Disp: , Rfl:  .  SUMAtriptan (IMITREX) 100 MG tablet, TAKE 1 TABLET ORALLY AS NEEDED, Disp: 10 tablet, Rfl: 5 .  meloxicam (MOBIC) 15 MG tablet, TAKE 1 TABLET ORALLY EVERY DAY FOR ARTHRITIS, Disp: , Rfl: 5 Social History   Socioeconomic History  . Marital status: Divorced    Spouse name: Not on file  . Number of children: 4  . Years of education: Not on file  . Highest education level: Not on file  Occupational History  . Occupation: Disabled    Comment: Fibromyalgia, arthritis  Social Needs  . Financial resource strain: Not on file  . Food insecurity:    Worry: Not on file    Inability: Not on file  . Transportation needs:    Medical: Not on file    Non-medical: Not on file  Tobacco Use  . Smoking status: Never Smoker  . Smokeless tobacco: Never Used  Substance and Sexual Activity  . Alcohol use: No    Alcohol/week: 0.0 standard drinks    Comment: minimal use  . Drug use: No  . Sexual activity: Not on file  Lifestyle  . Physical activity:    Days per week: Not on file  Minutes per session: Not on file  . Stress: Not on file  Relationships  . Social connections:    Talks on phone: Not on file    Gets together: Not on file    Attends religious service: Not on file    Active member of club or organization: Not on file    Attends meetings of clubs or organizations: Not on file    Relationship status: Not on file  . Intimate partner violence:    Fear of current or ex partner: Not on file    Emotionally abused: Not on file    Physically abused: Not on file    Forced sexual activity: Not on file  Other Topics Concern  . Not on file  Social History Narrative  . Not on file   Family History  Problem Relation Age of Onset  . Depression Mother         suicide  . Alcohol abuse Father   . Coronary artery disease Father     Objective: Office vital signs reviewed. BP 124/76   Pulse 66   Temp 98.1 F (36.7 C) (Oral)   Ht 5\' 9"  (1.753 m)   Wt 205 lb (93 kg)   LMP 09/16/1994   BMI 30.27 kg/m   Physical Examination:  General: Awake, alert, well nourished, No acute distress HEENT: Normal, sclera white.  No exophthalmos    Neck: Thyroid full feeling but no overt goiter; no palpable thyroid masses Cardio: regular rate and rhythm, S1S2 heard, no murmurs appreciated Pulm: clear to auscultation bilaterally, no wheezes, rhonchi or rales; normal work of breathing on room air Extremities: warm, well perfused, No edema, cyanosis or clubbing; +2 pulses bilaterally Skin: normal temperature Neuro: no tremor  Assessment/ Plan: 53 y.o. female   1. Acquired hypothyroidism Continues to have some constipation but this may be secondary to opioid use.  Energy is improving.  No signs or symptoms of overtreatment.  Check TSH.  If at appropriate level, okay to follow-up in 6 months.  Otherwise, plan for increase to 100 mcg daily with recheck in 6 to 8 weeks. - TSH  2. Globus sensation May be related to hypothyroidism.  We discussed that if she continues to have symptoms after thyroid levels are appropriate, may need to consider ultrasound of the thyroid to further evaluate.  She voiced good understanding is agreeable with the plan.   Orders Placed This Encounter  Procedures  . TSH   No orders of the defined types were placed in this encounter.    Colleen Ip, DO [facility] [phone]

## 2018-11-28 LAB — TSH: TSH: 4.16 u[IU]/mL (ref 0.450–4.500)

## 2018-11-30 ENCOUNTER — Other Ambulatory Visit: Payer: Self-pay | Admitting: Family Medicine

## 2018-11-30 MED ORDER — LEVOTHYROXINE SODIUM 75 MCG PO TABS: 75.0000 ug | ORAL_TABLET | Freq: Every day | ORAL | 1 refills | Status: DC

## 2019-03-03 DIAGNOSIS — M47816 Spondylosis without myelopathy or radiculopathy, lumbar region: Secondary | ICD-10-CM | POA: Diagnosis not present

## 2019-03-03 DIAGNOSIS — M533 Sacrococcygeal disorders, not elsewhere classified: Secondary | ICD-10-CM | POA: Diagnosis not present

## 2019-03-03 DIAGNOSIS — G894 Chronic pain syndrome: Secondary | ICD-10-CM | POA: Diagnosis not present

## 2019-03-08 DIAGNOSIS — G43019 Migraine without aura, intractable, without status migrainosus: Secondary | ICD-10-CM | POA: Diagnosis not present

## 2019-03-08 DIAGNOSIS — G8929 Other chronic pain: Secondary | ICD-10-CM | POA: Diagnosis not present

## 2019-03-08 DIAGNOSIS — G40219 Localization-related (focal) (partial) symptomatic epilepsy and epileptic syndromes with complex partial seizures, intractable, without status epilepticus: Secondary | ICD-10-CM | POA: Diagnosis not present

## 2019-03-08 DIAGNOSIS — F331 Major depressive disorder, recurrent, moderate: Secondary | ICD-10-CM | POA: Diagnosis not present

## 2019-04-09 DIAGNOSIS — M461 Sacroiliitis, not elsewhere classified: Secondary | ICD-10-CM | POA: Diagnosis not present

## 2019-04-09 DIAGNOSIS — M47816 Spondylosis without myelopathy or radiculopathy, lumbar region: Secondary | ICD-10-CM | POA: Diagnosis not present

## 2019-05-13 DIAGNOSIS — Z79891 Long term (current) use of opiate analgesic: Secondary | ICD-10-CM | POA: Diagnosis not present

## 2019-05-31 ENCOUNTER — Ambulatory Visit (INDEPENDENT_AMBULATORY_CARE_PROVIDER_SITE_OTHER): Payer: Medicaid Other | Admitting: Family Medicine

## 2019-05-31 DIAGNOSIS — R1310 Dysphagia, unspecified: Secondary | ICD-10-CM

## 2019-05-31 DIAGNOSIS — I10 Essential (primary) hypertension: Secondary | ICD-10-CM

## 2019-05-31 DIAGNOSIS — Z1159 Encounter for screening for other viral diseases: Secondary | ICD-10-CM | POA: Diagnosis not present

## 2019-05-31 DIAGNOSIS — N3281 Overactive bladder: Secondary | ICD-10-CM

## 2019-05-31 DIAGNOSIS — E039 Hypothyroidism, unspecified: Secondary | ICD-10-CM

## 2019-05-31 DIAGNOSIS — R1319 Other dysphagia: Secondary | ICD-10-CM

## 2019-05-31 DIAGNOSIS — Z114 Encounter for screening for human immunodeficiency virus [HIV]: Secondary | ICD-10-CM

## 2019-05-31 NOTE — Progress Notes (Signed)
Telephone visit  Subjective: CC:HTN, thyroid d/o PCP: Janora Norlander, DO XIP:Colleen Sellers is a 53 y.o. female calls for telephone consult today. Patient provides verbal consent for consult held via phone.  Location of patient: home Location of provider: [facility] Others present for call: none  1.  Hypertension Patient reports compliance with lisinopril 10 mg daily.  Denies any chest pain, shortness of breath.  She does report that she is been having intermittent headaches and thinks that this is related to elevated blood pressures as she can "see the veins pop out in her hands and in her head".  She has not been monitoring her blood pressure.  She does have history of migraine disorder.  2.  Hypothyroidism Patient reports compliance with Synthroid 75 mcg daily.  No her palpitations, tremor, change in weight or bowel habits.  However she does report difficulty swallowing which has been ongoing for quite some time now.  She notes a history of hiatal hernia.  She was previously checked out by Dr. Gala Romney but notes that this is been many years ago.   ROS: Per HPI  Allergies  Allergen Reactions  . Ceftin Other (See Comments)    Unknown  . Codeine Nausea And Vomiting  . Morphine Hives and Itching  . Adhesive [Tape] Rash  . Latex Rash   Past Medical History:  Diagnosis Date  . Chest pain    Dyspnea, diaphoresis  . CHF (congestive heart failure) ([place])   . Chronic back pain   . Degenerative joint disease    Also scoliosis and pectus deformity  . Depression   . Fibromyalgia   . IBS (irritable bowel syndrome)    Mild hematochezia; negative for celiac disease; mild Schatzki's ring; small hiatal hernia; minor gastric erosions; few diverticula  . MS (multiple sclerosis) ([place])   . Overweight(278.02)    Negative sleep study-2010, but treated for narcolepsy  . Seizure disorder ([place])   . Surgical menopause 2002   age 53    Current Outpatient Medications:  .   amphetamine-dextroamphetamine (ADDERALL) 15 MG tablet, Take 1 tablet by mouth 2 (two) times daily., Disp: , Rfl: 0 .  levothyroxine (SYNTHROID, LEVOTHROID) 75 MCG tablet, Take 1 tablet (75 mcg total) by mouth daily., Disp: 90 tablet, Rfl: 1 .  lisinopril (PRINIVIL,ZESTRIL) 10 MG tablet, TAKE 1 TABLET BY MOUTH EVERY DAY, Disp: 30 tablet, Rfl: 3 .  meloxicam (MOBIC) 15 MG tablet, TAKE 1 TABLET ORALLY EVERY DAY FOR ARTHRITIS, Disp: , Rfl: 5 .  oxyCODONE (OXY IR/ROXICODONE) 5 MG immediate release tablet, Take 5 mg by mouth every 4 (four) hours as needed for severe pain., Disp: , Rfl:  .  SUMAtriptan (IMITREX) 100 MG tablet, TAKE 1 TABLET ORALLY AS NEEDED, Disp: 10 tablet, Rfl: 5  Assessment/ Plan: 53 y.o. female   1. Acquired hypothyroidism Some difficulty swallowing but unsure if this is related to GI issues versus thyroid.  She will come in for thyroid panel - Thyroid Panel With TSH; Future - CMP14+EGFR; Future  2. Essential hypertension Has not been monitoring so unsure if controlled.  She will come in for renal function check - CMP14+EGFR; Future  3. Overactive bladder We discussed consideration for initiation of spastic bladder medications like Myrbetriq.  Patient would like to hold off on this given the potential for elevation in blood pressure with the medication.  I have given her more information and she will follow-up on this issue PRN  4. Esophageal dysphagia Question stricture.  She has known  hiatal hernia per her report.  I have referred her back to Dr. Gala Romney in [place] for consideration of EGD. - Ambulatory referral to Gastroenterology  5. Encounter for hepatitis C screening test for low risk patient Will place screening per CDC guidelines to be obtained along with her labs - Hepatitis C antibody; Future  6. Screening for HIV without presence of risk factors - HIV antibody (with reflex); Future  Start time: 3:19pm End time: 3:27pm  Total time spent on patient care  (including telephone call/ virtual visit): 14 minutes  [place], [place] [phone]

## 2019-05-31 NOTE — Patient Instructions (Signed)
Overactive Bladder, Adult  Overactive bladder refers to a condition in which a person has a sudden need to pass urine. The person may leak urine if he or she cannot get to the bathroom fast enough (urinary incontinence). A person with this condition may also wake up several times in the night to go to the bathroom. Overactive bladder is associated with poor nerve signals between your bladder and your brain. Your bladder may get the signal to empty before it is full. You may also have very sensitive muscles that make your bladder squeeze too soon. These symptoms might interfere with daily work or social activities. What are the causes? This condition may be associated with or caused by:  Urinary tract infection.  Infection of nearby tissues, such as the prostate.  Prostate enlargement.  Surgery on the uterus or urethra.  Bladder stones, inflammation, or tumors.  Drinking too much caffeine or alcohol.  Certain medicines, especially medicines that get rid of extra fluid in the body (diuretics).  Muscle or nerve weakness, especially from: ? A spinal cord injury. ? Stroke. ? Multiple sclerosis. ? Parkinson's disease.  Diabetes.  Constipation. What increases the risk? You may be at greater risk for overactive bladder if you:  Are an older adult.  Smoke.  Are going through menopause.  Have prostate problems.  Have a neurological disease, such as stroke, dementia, Parkinson's disease, or multiple sclerosis (MS).  Eat or drink things that irritate the bladder. These include alcohol, spicy food, and caffeine.  Are overweight or obese. What are the signs or symptoms? Symptoms of this condition include:  Sudden, strong urge to urinate.  Leaking urine.  Urinating 8 or more times a day.  Waking up to urinate 2 or more times a night. How is this diagnosed? Your health care provider may suspect overactive bladder based on your symptoms. He or she will diagnose this condition  by:  A physical exam and medical history.  Blood or urine tests. You might need bladder or urine tests to help determine what is causing your overactive bladder. You might also need to see a health care provider who specializes in urinary tract problems (urologist). How is this treated? Treatment for overactive bladder depends on the cause of your condition and whether it is mild or severe. You can also make lifestyle changes at home. Options include:  Bladder training. This may include: ? Learning to control the urge to urinate by following a schedule that directs you to urinate at regular intervals (timed voiding). ? Doing Kegel exercises to strengthen your pelvic floor muscles, which support your bladder. Toning these muscles can help you control urination, even if your bladder muscles are overactive.  Special devices. This may include: ? Biofeedback, which uses sensors to help you become aware of your body's signals. ? Electrical stimulation, which uses electrodes placed inside the body (implanted) or outside the body. These electrodes send gentle pulses of electricity to strengthen the nerves or muscles that control the bladder. ? Women may use a plastic device that fits into the vagina and supports the bladder (pessary).  Medicines. ? Antibiotics to treat bladder infection. ? Antispasmodics to stop the bladder from releasing urine at the wrong time. ? Tricyclic antidepressants to relax bladder muscles. ? Injections of botulinum toxin type A directly into the bladder tissue to relax bladder muscles.  Lifestyle changes. This may include: ? Weight loss. Talk to your health care provider about weight loss methods that would work best for you. ?   Diet changes. This may include reducing how much alcohol and caffeine you consume, or drinking fluids at different times of the day. ? Not smoking. Do not use any products that contain nicotine or tobacco, such as cigarettes and e-cigarettes. If  you need help quitting, ask your health care provider.  Surgery. ? A device may be implanted to help manage the nerve signals that control urination. ? An electrode may be implanted to stimulate electrical signals in the bladder. ? A procedure may be done to change the shape of the bladder. This is done only in very severe cases. Follow these instructions at home: Lifestyle  Make any diet or lifestyle changes that are recommended by your health care provider. These may include: ? Drinking less fluid or drinking fluids at different times of the day. ? Cutting down on caffeine or alcohol. ? Doing Kegel exercises. ? Losing weight if needed. ? Eating a healthy and balanced diet to prevent constipation. This may include:  Eating foods that are high in fiber, such as fresh fruits and vegetables, whole grains, and beans.  Limiting foods that are high in fat and processed sugars, such as fried and sweet foods. General instructions  Take over-the-counter and prescription medicines only as told by your health care provider.  If you were prescribed an antibiotic medicine, take it as told by your health care provider. Do not stop taking the antibiotic even if you start to feel better.  Use any implants or pessary as told by your health care provider.  If needed, wear pads to absorb urine leakage.  Keep a journal or log to track how much and when you drink and when you feel the need to urinate. This will help your health care provider monitor your condition.  Keep all follow-up visits as told by your health care provider. This is important. Contact a health care provider if:  You have a fever.  Your symptoms do not get better with treatment.  Your pain and discomfort get worse.  You have more frequent urges to urinate. Get help right away if:  You are not able to control your bladder. Summary  Overactive bladder refers to a condition in which a person has a sudden need to pass urine.   Several conditions may lead to an overactive bladder.  Treatment for overactive bladder depends on the cause and severity of your condition.  Follow your health care provider's instructions about lifestyle changes, doing Kegel exercises, keeping a journal, and taking medicines. This information is not intended to replace advice given to you by your health care provider. Make sure you discuss any questions you have with your health care provider. Document Released: 06/29/2009 Document Revised: 12/24/2018 Document Reviewed: 09/18/2017 Elsevier Patient Education  2020 Else[employer]  Mirabegron extended-release tablets What is this medicine? MIRABEGRON (MIR a BEG ron) is used to treat overactive bladder. This medicine reduces the amount of bathroom visits. It may also help to control wetting accidents. It may be used alone, but sometimes may be given with other treatments. This medicine may be used for other purposes; ask your health care provider or pharmacist if you have questions. COMMON BRAND NAME(S): Myrbetriq What should I tell my health care provider before I take this medicine? They need to know if you have any of these conditions:  high blood pressure  kidney disease  liver disease  problems urinating  prostate disease  an unusual or allergic reaction to mirabegron, other medicines, foods, dyes, or preservatives    pregnant or trying to get pregnant  breast-feeding How should I use this medicine? Take this medicine by mouth with a glass of water. Follow the directions on the prescription label. Do not cut, crush or chew this medicine. You can take it with or without food. If it upsets your stomach, take it with food. Take your medicine at regular intervals. Do not take it more often than directed. Do not stop taking except on your doctor's advice. Talk to your pediatrician regarding the use of this medicine in children. Special care may be needed. Overdosage: If you think  you have taken too much of this medicine contact a poison control center or emergency room at once. NOTE: This medicine is only for you. Do not share this medicine with others. What if I miss a dose? If you miss a dose, take it as soon as you can. If it is almost time for your next dose, take only that dose. Do not take double or extra doses. What may interact with this medicine?  codeine  desipramine  digoxin  flecainide  MAOIs like Carbex, Eldepryl, Marplan, Nardil, and Parnate  methadone  metoprolol  pimozide  propafenone  thioridazine  warfarin This list may not describe all possible interactions. Give your health care provider a list of all the medicines, herbs, non-prescription drugs, or dietary supplements you use. Also tell them if you smoke, drink alcohol, or use illegal drugs. Some items may interact with your medicine. What should I watch for while using this medicine? Visit your doctor or health care professional for regular checks on your progress. Check your blood pressure as directed. Ask your doctor or health care professional what your blood pressure should be and when you should contact him or her. You may need to limit your intake of tea, coffee, caffeinated sodas, or alcohol. These drinks may make your symptoms worse. What side effects may I notice from receiving this medicine? Side effects that you should report to your doctor or health care professional as soon as possible:  allergic reactions like skin rash, itching or hives, swelling of the face, lips, or tongue  high blood pressure  fast, irregular heartbeat  redness, blistering, peeling or loosening of the skin, including inside the mouth  signs of infection like fever or chills; pain or difficulty passing urine  trouble passing urine or change in the amount of urine Side effects that usually do not require medical attention (report to your doctor or health care professional if they continue or  are bothersome):  constipation  dry mouth  headache  runny nose  stomach upset This list may not describe all possible side effects. Call your doctor for medical advice about side effects. You may report side effects to FDA at 1-800-FDA-1088. Where should I keep my medicine? Keep out of the reach of children. Store at room temperature between 15 and 30 degrees C (59 and 86 degrees F). Throw away any unused medicine after the expiration date. NOTE: This sheet is a summary. It may not cover all possible information. If you have questions about this medicine, talk to your doctor, pharmacist, or health care provider.  2020 Elsevier/Gold Standard (2017-01-23 11:33:21)  

## 2019-06-02 DIAGNOSIS — M7918 Myalgia, other site: Secondary | ICD-10-CM | POA: Diagnosis not present

## 2019-06-02 DIAGNOSIS — Z79891 Long term (current) use of opiate analgesic: Secondary | ICD-10-CM | POA: Diagnosis not present

## 2019-06-02 DIAGNOSIS — Z5181 Encounter for therapeutic drug level monitoring: Secondary | ICD-10-CM | POA: Diagnosis not present

## 2019-06-02 DIAGNOSIS — M461 Sacroiliitis, not elsewhere classified: Secondary | ICD-10-CM | POA: Diagnosis not present

## 2019-06-02 DIAGNOSIS — M47816 Spondylosis without myelopathy or radiculopathy, lumbar region: Secondary | ICD-10-CM | POA: Diagnosis not present

## 2019-06-02 DIAGNOSIS — G894 Chronic pain syndrome: Secondary | ICD-10-CM | POA: Diagnosis not present

## 2019-06-09 ENCOUNTER — Encounter: Payer: Self-pay | Admitting: Internal Medicine

## 2019-06-24 ENCOUNTER — Ambulatory Visit: Payer: Medicaid Other | Admitting: Nurse Practitioner

## 2019-06-24 NOTE — Progress Notes (Deleted)
Primary Care Physician:  Raliegh Ip, DO Primary Gastroenterologist:  Dr. Jena Gauss  No chief complaint on file.   HPI:   Colleen Sellers is a 53 y.o. female who presents on referral from primary care for dysphasia.  Reviewed information associated with referral including office visit dated 05/31/2019.  At that time she noted difficulty swallowing ongoing for some time, history of hiatal hernia.  Was last evaluated by our office "many years ago."  Due to question of possible stricture she was referred to our office for further evaluation.  EGD completed 11/22/2008 which found a noncritical Schatzki's ring status post dilation, otherwise normal.  Small hiatal hernia.  Tiny antral erosions without clinical significance, otherwise normal duodenum although duodenal biopsies were taken.  Surgical pathology found the biopsies to be benign small bowel mucosa. recommended begin Zegerid 40 mg daily.  Colonoscopy the same day found friable anal canal, otherwise normal rectum, scattered pancolonic diverticula, no polyps.  Recommended Anusol suppositories for 10 days.  Recommended 84-month follow-up.  Patient did not follow-up as recommended.  Today she states   Past Medical History:  Diagnosis Date  . Chest pain    Dyspnea, diaphoresis  . CHF (congestive heart failure) (HCC)   . Chronic back pain   . Degenerative joint disease    Also scoliosis and pectus deformity  . Depression   . Fibromyalgia   . IBS (irritable bowel syndrome)    Mild hematochezia; negative for celiac disease; mild Schatzki's ring; small hiatal hernia; minor gastric erosions; few diverticula  . MS (multiple sclerosis) (HCC)   . Overweight(278.02)    Negative sleep study-2010, but treated for narcolepsy  . Seizure disorder (HCC)   . Surgical menopause 2002   age 48    Past Surgical History:  Procedure Laterality Date  . BRAIN SURGERY    . CHOLECYSTECTOMY    . COLONOSCOPY  2001, 2010   negative/friable anal canal  scattered pancolonic diverticula  . ESOPHAGOGASTRODUODENOSCOPY  11/22/08   noncritical Schatzi ring/small hiatal herina/tiny antral reosions  . RHINOPLASTY     Deviated septum  . SALPINGOOPHORECTOMY  2002   Bilateral  . TUBAL LIGATION    . VAGINAL HYSTERECTOMY  1996    Current Outpatient Medications  Medication Sig Dispense Refill  . amphetamine-dextroamphetamine (ADDERALL) 15 MG tablet Take 1 tablet by mouth 2 (two) times daily.  0  . levothyroxine (SYNTHROID, LEVOTHROID) 75 MCG tablet Take 1 tablet (75 mcg total) by mouth daily. 90 tablet 1  . lisinopril (PRINIVIL,ZESTRIL) 10 MG tablet TAKE 1 TABLET BY MOUTH EVERY DAY 30 tablet 3  . meloxicam (MOBIC) 15 MG tablet TAKE 1 TABLET ORALLY EVERY DAY FOR ARTHRITIS  5  . oxyCODONE (OXY IR/ROXICODONE) 5 MG immediate release tablet Take 5 mg by mouth every 4 (four) hours as needed for severe pain.    . SUMAtriptan (IMITREX) 100 MG tablet TAKE 1 TABLET ORALLY AS NEEDED 10 tablet 5   No current facility-administered medications for this visit.     Allergies as of 06/24/2019 - Review Complete 05/31/2019  Allergen Reaction Noted  . Ceftin Other (See Comments) 10/15/2011  . Codeine Nausea And Vomiting 06/15/2010  . Morphine Hives and Itching   . Adhesive [tape] Rash 06/12/2014  . Latex Rash 06/12/2014    Family History  Problem Relation Age of Onset  . Depression Mother        suicide  . Alcohol abuse Father   . Coronary artery disease Father  Social History   Socioeconomic History  . Marital status: Divorced    Spouse name: Not on file  . Number of children: 4  . Years of education: Not on file  . Highest education level: Not on file  Occupational History  . Occupation: Disabled    Comment: Fibromyalgia, arthritis  Social Needs  . Financial resource strain: Not on file  . Food insecurity    Worry: Not on file    Inability: Not on file  . Transportation needs    Medical: Not on file    Non-medical: Not on file  Tobacco  Use  . Smoking status: Never Smoker  . Smokeless tobacco: Never Used  Substance and Sexual Activity  . Alcohol use: No    Alcohol/week: 0.0 standard drinks    Comment: minimal use  . Drug use: No  . Sexual activity: Not on file  Lifestyle  . Physical activity    Days per week: Not on file    Minutes per session: Not on file  . Stress: Not on file  Relationships  . Social Herbalist on phone: Not on file    Gets together: Not on file    Attends religious service: Not on file    Active member of club or organization: Not on file    Attends meetings of clubs or organizations: Not on file    Relationship status: Not on file  . Intimate partner violence    Fear of current or ex partner: Not on file    Emotionally abused: Not on file    Physically abused: Not on file    Forced sexual activity: Not on file  Other Topics Concern  . Not on file  Social History Narrative  . Not on file    Review of Systems: General: Negative for anorexia, weight loss, fever, chills, fatigue, weakness. Eyes: Negative for vision changes.  ENT: Negative for hoarseness, difficulty swallowing , nasal congestion. CV: Negative for chest pain, angina, palpitations, dyspnea on exertion, peripheral edema.  Respiratory: Negative for dyspnea at rest, dyspnea on exertion, cough, sputum, wheezing.  GI: See history of present illness. GU:  Negative for dysuria, hematuria, urinary incontinence, urinary frequency, nocturnal urination.  MS: Negative for joint pain, low back pain.  Derm: Negative for rash or itching.  Neuro: Negative for weakness, abnormal sensation, seizure, frequent headaches, memory loss, confusion.  Psych: Negative for anxiety, depression, suicidal ideation, hallucinations.  Endo: Negative for unusual weight change.  Heme: Negative for bruising or bleeding. Allergy: Negative for rash or hives.    Physical Exam: LMP 09/16/1994  General:   Alert and oriented. Pleasant and  cooperative. Well-nourished and well-developed.  Head:  Normocephalic and atraumatic. Eyes:  Without icterus, sclera clear and conjunctiva pink.  Ears:  Normal auditory acuity. Mouth:  No deformity or lesions, oral mucosa pink.  Throat/Neck:  Supple, without mass or thyromegaly. Cardiovascular:  S1, S2 present without murmurs appreciated. Normal pulses noted. Extremities without clubbing or edema. Respiratory:  Clear to auscultation bilaterally. No wheezes, rales, or rhonchi. No distress.  Gastrointestinal:  +BS, soft, non-tender and non-distended. No HSM noted. No guarding or rebound. No masses appreciated.  Rectal:  Deferred  Musculoskalatal:  Symmetrical without gross deformities. Normal posture. Skin:  Intact without significant lesions or rashes. Neurologic:  Alert and oriented x4;  grossly normal neurologically. Psych:  Alert and cooperative. Normal mood and affect. Heme/Lymph/Immune: No significant cervical adenopathy. No excessive bruising noted.    06/24/2019 1:11 PM  Disclaimer: This note was dictated with voice recognition software. Similar sounding words can inadvertently be transcribed and may not be corrected upon review.

## 2019-07-27 ENCOUNTER — Ambulatory Visit: Payer: Medicaid Other | Admitting: Nurse Practitioner

## 2019-07-27 ENCOUNTER — Encounter: Payer: Self-pay | Admitting: Gastroenterology

## 2019-07-27 NOTE — Progress Notes (Deleted)
Primary Care Physician:  Raliegh Ip, DO Primary Gastroenterologist:  Dr. Jena Gauss  No chief complaint on file.   HPI:   Colleen Sellers is a 53 y.o. female who presents on referral from primary care for dysphagia symptoms.  Reviewed information related to referral including office visit dated 05/31/2019 for multiple complaints including esophageal dysphagia.  Noted esophageal dysphagia complaints at that time, has known hiatal hernia.  Recommended referral to GI for consideration of EGD.  She was also screened for hepatitis C for CDC guidelines.  This is yet to be completed.  Previous EGD completed 11/22/2008 which found noncritical Schatzki's ring hiatal hernia, tiny antral erosions without clinical significance, normal duodenum.  Recommended begin Zegerid 40 mg daily.  Colonoscopy completed the same day found friable anal canal, otherwise normal rectum, scattered pancolonic diverticula, remaining colonic mucosa and terminal ileal mucosa normal.  Recommended Benefiber daily, 10-day course of Anusol suppositories daily, repeat colonoscopy in 10 years.  Today she states   ***TCS  Past Medical History:  Diagnosis Date  . Chest pain    Dyspnea, diaphoresis  . CHF (congestive heart failure) (HCC)   . Chronic back pain   . Degenerative joint disease    Also scoliosis and pectus deformity  . Depression   . Fibromyalgia   . IBS (irritable bowel syndrome)    Mild hematochezia; negative for celiac disease; mild Schatzki's ring; small hiatal hernia; minor gastric erosions; few diverticula  . MS (multiple sclerosis) (HCC)   . Overweight(278.02)    Negative sleep study-2010, but treated for narcolepsy  . Seizure disorder (HCC)   . Surgical menopause 2002   age 47    Past Surgical History:  Procedure Laterality Date  . BRAIN SURGERY    . CHOLECYSTECTOMY    . COLONOSCOPY  2001, 2010   negative/friable anal canal scattered pancolonic diverticula  . ESOPHAGOGASTRODUODENOSCOPY  11/22/08    noncritical Schatzi ring/small hiatal herina/tiny antral reosions  . RHINOPLASTY     Deviated septum  . SALPINGOOPHORECTOMY  2002   Bilateral  . TUBAL LIGATION    . VAGINAL HYSTERECTOMY  1996    Current Outpatient Medications  Medication Sig Dispense Refill  . amphetamine-dextroamphetamine (ADDERALL) 15 MG tablet Take 1 tablet by mouth 2 (two) times daily.  0  . levothyroxine (SYNTHROID, LEVOTHROID) 75 MCG tablet Take 1 tablet (75 mcg total) by mouth daily. 90 tablet 1  . lisinopril (PRINIVIL,ZESTRIL) 10 MG tablet TAKE 1 TABLET BY MOUTH EVERY DAY 30 tablet 3  . meloxicam (MOBIC) 15 MG tablet TAKE 1 TABLET ORALLY EVERY DAY FOR ARTHRITIS  5  . oxyCODONE (OXY IR/ROXICODONE) 5 MG immediate release tablet Take 5 mg by mouth every 4 (four) hours as needed for severe pain.    . SUMAtriptan (IMITREX) 100 MG tablet TAKE 1 TABLET ORALLY AS NEEDED 10 tablet 5   No current facility-administered medications for this visit.     Allergies as of 07/27/2019 - Review Complete 05/31/2019  Allergen Reaction Noted  . Ceftin Other (See Comments) 10/15/2011  . Codeine Nausea And Vomiting 06/15/2010  . Morphine Hives and Itching   . Adhesive [tape] Rash 06/12/2014  . Latex Rash 06/12/2014    Family History  Problem Relation Age of Onset  . Depression Mother        suicide  . Alcohol abuse Father   . Coronary artery disease Father     Social History   Socioeconomic History  . Marital status: Divorced    Spouse  name: Not on file  . Number of children: 4  . Years of education: Not on file  . Highest education level: Not on file  Occupational History  . Occupation: Disabled    Comment: Fibromyalgia, arthritis  Social Needs  . Financial resource strain: Not on file  . Food insecurity    Worry: Not on file    Inability: Not on file  . Transportation needs    Medical: Not on file    Non-medical: Not on file  Tobacco Use  . Smoking status: Never Smoker  . Smokeless tobacco: Never Used   Substance and Sexual Activity  . Alcohol use: No    Alcohol/week: 0.0 standard drinks    Comment: minimal use  . Drug use: No  . Sexual activity: Not on file  Lifestyle  . Physical activity    Days per week: Not on file    Minutes per session: Not on file  . Stress: Not on file  Relationships  . Social Herbalist on phone: Not on file    Gets together: Not on file    Attends religious service: Not on file    Active member of club or organization: Not on file    Attends meetings of clubs or organizations: Not on file    Relationship status: Not on file  . Intimate partner violence    Fear of current or ex partner: Not on file    Emotionally abused: Not on file    Physically abused: Not on file    Forced sexual activity: Not on file  Other Topics Concern  . Not on file  Social History Narrative  . Not on file    Review of Systems: General: Negative for anorexia, weight loss, fever, chills, fatigue, weakness. Eyes: Negative for vision changes.  ENT: Negative for hoarseness, difficulty swallowing , nasal congestion. CV: Negative for chest pain, angina, palpitations, dyspnea on exertion, peripheral edema.  Respiratory: Negative for dyspnea at rest, dyspnea on exertion, cough, sputum, wheezing.  GI: See history of present illness. GU:  Negative for dysuria, hematuria, urinary incontinence, urinary frequency, nocturnal urination.  MS: Negative for joint pain, low back pain.  Derm: Negative for rash or itching.  Neuro: Negative for weakness, abnormal sensation, seizure, frequent headaches, memory loss, confusion.  Psych: Negative for anxiety, depression, suicidal ideation, hallucinations.  Endo: Negative for unusual weight change.  Heme: Negative for bruising or bleeding. Allergy: Negative for rash or hives.    Physical Exam: LMP 09/16/1994  General:   Alert and oriented. Pleasant and cooperative. Well-nourished and well-developed.  Head:  Normocephalic and  atraumatic. Eyes:  Without icterus, sclera clear and conjunctiva pink.  Ears:  Normal auditory acuity. Mouth:  No deformity or lesions, oral mucosa pink.  Throat/Neck:  Supple, without mass or thyromegaly. Cardiovascular:  S1, S2 present without murmurs appreciated. Normal pulses noted. Extremities without clubbing or edema. Respiratory:  Clear to auscultation bilaterally. No wheezes, rales, or rhonchi. No distress.  Gastrointestinal:  +BS, soft, non-tender and non-distended. No HSM noted. No guarding or rebound. No masses appreciated.  Rectal:  Deferred  Musculoskalatal:  Symmetrical without gross deformities. Normal posture. Skin:  Intact without significant lesions or rashes. Neurologic:  Alert and oriented x4;  grossly normal neurologically. Psych:  Alert and cooperative. Normal mood and affect. Heme/Lymph/Immune: No significant cervical adenopathy. No excessive bruising noted.    07/27/2019 1:13 PM   Disclaimer: This note was dictated with voice recognition software. Similar sounding words can  inadvertently be transcribed and may not be corrected upon review.

## 2019-08-30 ENCOUNTER — Other Ambulatory Visit: Payer: Self-pay | Admitting: Family Medicine

## 2019-09-01 DIAGNOSIS — G894 Chronic pain syndrome: Secondary | ICD-10-CM | POA: Diagnosis not present

## 2019-09-01 DIAGNOSIS — M461 Sacroiliitis, not elsewhere classified: Secondary | ICD-10-CM | POA: Diagnosis not present

## 2019-09-26 ENCOUNTER — Other Ambulatory Visit: Payer: Self-pay | Admitting: Family Medicine

## 2019-11-21 ENCOUNTER — Other Ambulatory Visit: Payer: Self-pay | Admitting: Family Medicine

## 2019-12-09 DIAGNOSIS — G40219 Localization-related (focal) (partial) symptomatic epilepsy and epileptic syndromes with complex partial seizures, intractable, without status epilepticus: Secondary | ICD-10-CM | POA: Diagnosis not present

## 2019-12-09 DIAGNOSIS — G8929 Other chronic pain: Secondary | ICD-10-CM | POA: Diagnosis not present

## 2019-12-09 DIAGNOSIS — G43019 Migraine without aura, intractable, without status migrainosus: Secondary | ICD-10-CM | POA: Diagnosis not present

## 2019-12-09 DIAGNOSIS — F331 Major depressive disorder, recurrent, moderate: Secondary | ICD-10-CM | POA: Diagnosis not present

## 2019-12-21 ENCOUNTER — Other Ambulatory Visit: Payer: Self-pay | Admitting: Family Medicine

## 2019-12-21 NOTE — Telephone Encounter (Signed)
Left message - need to call to schedule an appointment for medication refill.

## 2019-12-21 NOTE — Telephone Encounter (Signed)
[place]ott[place]. NTBS 30 days given 11/22/19

## 2019-12-29 DIAGNOSIS — M47816 Spondylosis without myelopathy or radiculopathy, lumbar region: Secondary | ICD-10-CM | POA: Diagnosis not present

## 2019-12-29 DIAGNOSIS — Z79891 Long term (current) use of opiate analgesic: Secondary | ICD-10-CM | POA: Diagnosis not present

## 2019-12-29 DIAGNOSIS — Z5181 Encounter for therapeutic drug level monitoring: Secondary | ICD-10-CM | POA: Diagnosis not present

## 2019-12-29 DIAGNOSIS — M461 Sacroiliitis, not elsewhere classified: Secondary | ICD-10-CM | POA: Diagnosis not present

## 2019-12-29 DIAGNOSIS — M7918 Myalgia, other site: Secondary | ICD-10-CM | POA: Diagnosis not present

## 2020-01-27 ENCOUNTER — Encounter: Payer: Self-pay | Admitting: Physical Therapy

## 2020-01-27 ENCOUNTER — Ambulatory Visit: Payer: Medicaid Other | Attending: Nurse Practitioner | Admitting: Physical Therapy

## 2020-01-27 ENCOUNTER — Other Ambulatory Visit: Payer: Self-pay

## 2020-01-27 DIAGNOSIS — M5442 Lumbago with sciatica, left side: Secondary | ICD-10-CM | POA: Diagnosis not present

## 2020-01-27 DIAGNOSIS — M6281 Muscle weakness (generalized): Secondary | ICD-10-CM | POA: Insufficient documentation

## 2020-01-27 DIAGNOSIS — G8929 Other chronic pain: Secondary | ICD-10-CM | POA: Diagnosis not present

## 2020-01-27 DIAGNOSIS — R293 Abnormal posture: Secondary | ICD-10-CM

## 2020-01-27 NOTE — Therapy (Signed)
[facility] [address] [place], [place], [address] Phone: [phone]   Fax:  [phone]  Physical Therapy Treatment  Patient Details  Name: Colleen Sellers MRN: [id number] Date of Birth: Oct 16, 1965 Referring Provider (PT): Roseanna Rainbow, NP   Encounter Date: 01/27/2020  PT End of Session - 01/27/20 2156    Visit Number  1    Number of Visits  12    Date for PT Re-Evaluation  03/16/20    Authorization Type  Medicaid    PT Start Time  1345    PT Stop Time  1428    PT Time Calculation (min)  43 min    Activity Tolerance  Patient limited by pain    Behavior During Therapy  [facility] for tasks assessed/performed       Past Medical History:  Diagnosis Date  . Chest pain    Dyspnea, diaphoresis  . CHF (congestive heart failure) (HCC)   . Chronic back pain   . Degenerative joint disease    Also scoliosis and pectus deformity  . Depression   . Fibromyalgia   . IBS (irritable bowel syndrome)    Mild hematochezia; negative for celiac disease; mild Schatzki's ring; small hiatal hernia; minor gastric erosions; few diverticula  . MS (multiple sclerosis) (HCC)   . Overweight(278.02)    Negative sleep study-2010, but treated for narcolepsy  . Seizure disorder (HCC)   . Surgical menopause 2002   age 20    Past Surgical History:  Procedure Laterality Date  . BRAIN SURGERY    . CHOLECYSTECTOMY    . COLONOSCOPY  2001, 2010   negative/friable anal canal scattered pancolonic diverticula  . ESOPHAGOGASTRODUODENOSCOPY  11/22/08   noncritical Schatzi ring/small hiatal herina/tiny antral reosions  . RHINOPLASTY     Deviated septum  . SALPINGOOPHORECTOMY  2002   Bilateral  . TUBAL LIGATION    . VAGINAL HYSTERECTOMY  1996    There were no vitals filed for this visit.  Subjective Assessment - 01/27/20 2114    Subjective  COVID-19 screening performed upon arrival.Patient arrives to physical therapy with a chronic history of low back pain. Patinet  reports pain with ADLs and home activities, particularly with picking objects up from the floor. Patinet reports pain with negotiating steps to access her 2nd floor apartment. Patient's pain at worst is 10/10 with driving greater than 15 mins and with any physical activity. Patient's pain at best is rated a 4/10. Patient's goals are to decrease pain, improve movement, improve sleep, and have less difficulties with ADLs and home activites.    Pertinent History  HTN, CHF, Asthma, Fibromyalgia, history of siezures, depression, MS    Limitations  Standing;House hold activities;Walking;Sitting    How long can you sit comfortably?  15 mins    How long can you stand comfortably?  short periods    How long can you walk comfortably?  short periods    Diagnostic tests  X-Ray: mild disc space narrowing at L3-L4, moderate disc space narrowing at L4-L5; mild facet joint arthropathy in lower lumbar spine    Patient Stated Goals  decrease pain    Currently in Pain?  Yes   "moderate"   Pain Location  Back    Pain Orientation  Lower    Pain Descriptors / Indicators  Aching;Sore    Pain Type  Chronic pain    Pain Radiating Towards  left posterior knee    Pain Onset  More than a month ago  Pain Frequency  Constant    Aggravating Factors   "Drivng more thant 15 mns, any physical activity"    Pain Relieving Factors  "nothing"    Effect of Pain on Daily Activities  diffculties with everything and any physical activity.         [facility] PT Assessment - 01/27/20 0001      Assessment   Medical Diagnosis  Spondylosis without myelopathy or radiculopathy, lumbar region, sacroilitis    Referring Provider (PT)  Roseanna Rainbow, NP    Onset Date/Surgical Date  --   ongoing   Next MD Visit  July 19    Prior Therapy  no      Precautions   Precautions  None      Restrictions   Weight Bearing Restrictions  No      Balance Screen   Has the patient fallen in the past 6 months  No    Has the patient had a decrease in  activity level because of a fear of falling?   No    Is the patient reluctant to leave their home because of a fear of falling?   No      Home Environment   Living Environment  Private residence    Living Arrangements  Alone    Type of Home  Apartment    Home Access  Stairs to enter    Entrance Stairs-Number of Steps  2 flights    Entrance Stairs-Rails  Can reach both      Prior Function   Level of Independence  Independent with basic ADLs      Posture/Postural Control   Posture/Postural Control  Postural limitations    Postural Limitations  Increased thoracic kyphosis;Decreased lumbar lordosis;Rounded Shoulders;Forward head      ROM / Strength   AROM / PROM / Strength  AROM;Strength      AROM   AROM Assessment Site  Lumbar    Lumbar Flexion  5" finger tip to floor    Lumbar Extension  5 degrees    Lumbar - Right Side Bend  23" finger tip to floor    Lumbar - Left Side Bend  24" finger tip to floor      Strength   Strength Assessment Site  Hip;Knee    Right/Left Hip  Right;Left    Right Hip Flexion  3+/5    Right Hip Extension  4-/5    Right Hip ABduction  3+/5    Left Hip Flexion  3+/5    Left Hip Extension  3+/5    Right/Left Knee  Right;Left    Right Knee Flexion  4-/5    Right Knee Extension  4/5    Left Knee Flexion  3+/5    Left Knee Extension  4-/5      Palpation   Palpation comment  tender to lower lumbar paraspinals, bilateral QL, left glute and SIJ region      Special Tests    Special Tests  Sacrolliac Tests    Other special tests  left posterior tilt of SIJ    Sacroiliac Tests   Sacral Thrust      Sacral thrust    Findings  Positive      Transfers   Comments  slow transtional movements, supine to long sit transition      Ambulation/Gait   Gait Pattern  Step-through pattern;Decreased stride length  PT Short Term Goals - 01/27/20 2120      PT SHORT TERM GOAL #1   Title  Patient will be  independent with HEP    Baseline  no knowledge of exercises    Time  3    Period  Weeks    Status  New      PT SHORT TERM GOAL #2   Title  Patient will demonstrate proper bed mobility to protect spine during transitions.    Baseline  supine to long sit transtions    Time  3    Period  Weeks    Status  New        PT Long Term Goals - 01/27/20 2121      PT LONG TERM GOAL #1   Title  Patient will be independent with advanced HEP    Baseline  no knowledge of exercises    Time  6    Period  Weeks    Status  New      PT LONG TERM GOAL #2   Title  Patient will demonstrate 4/5 or greater bilateral LE MMT to improve ability to perform functional tasks.    Baseline  3/5 to 4-/5 bilateral LE MMT    Time  6    Period  Weeks    Status  New      PT LONG TERM GOAL #3   Title  Patient will report ability to sit for 25 mins or greater for comfort while driving.    Baseline  pain with 15 mins of driving    Time  6    Period  Weeks    Status  New      PT LONG TERM GOAL #4   Title  Patient will report ability to perform ADLs and functional task with low back pain less than or equal to 3/10    Baseline  Pain can reach 10/10    Time  6    Period  Weeks    Status  New      PT LONG TERM GOAL #5   Title  Patient will report a centralization of neurological symptoms to low back or report no neurological symptoms to indicate decreased nerve irritation.    Baseline  left posterior knee    Time  6    Period  Weeks    Status  New            Plan - 01/27/20 2147    Clinical Impression Statement  Patient is a 54 year old female who presents to physical therapy with low back pain, decreased lumbar AROM, decreased bilateral LE MMT, and abnormal posture. Patient with notable tenderness to palpation to lumbar paraspinals, bilateral QLs, and left SIJ. Lumbar paraspinals L>R and L QL with increased tone upon palpation. Patient and PT discussed plan of care and discussed HEP to which she  reported understanding. Patient would benefit from skilled physical therapy to address deficits and address patient's goals.    Personal Factors and Comorbidities  Comorbidity 3+    Comorbidities  HTN, CHF, Asthma, Fibromyalgia, history of siezures, depression, MS    Examination-Activity Limitations  Bed Mobility;Locomotion Level;Transfers;Sit;Stairs;Stand;Dressing;Hygiene/Grooming    Stability/Clinical Decision Making  Stable/Uncomplicated    Clinical Decision Making  Low    Rehab Potential  Fair    PT Frequency  2x / week    PT Duration  6 weeks    PT Treatment/Interventions  Electrical Stimulation;Cryotherapy;Iontophoresis 4mg /ml Dexamethasone;Moist Heat;Traction;Ultrasound;ADLs/Self Care Home Management;Neuromuscular re-education;Balance  training;Therapeutic exercise;Therapeutic activities;Functional mobility training;Patient/family education;Passive range of motion;Dry needling;Manual techniques    PT Next Visit Plan  nustep, core stabilization, LE strengthening, modalities PRN for pain relief    PT Home Exercise Plan  see patient education section    Consulted and Agree with Plan of Care  Patient       Patient will benefit from skilled therapeutic intervention in order to improve the following deficits and impairments:  Decreased range of motion, Difficulty walking, Decreased activity tolerance, Decreased strength, Postural dysfunction, Pain  Visit Diagnosis: Chronic bilateral low back pain with left-sided sciatica - Plan: PT plan of care cert/re-cert  Muscle weakness (generalized) - Plan: PT plan of care cert/re-cert  Abnormal posture - Plan: PT plan of care cert/re-cert     Problem List Patient Active Problem List   Diagnosis Date Noted  . Overactive bladder 05/31/2019  . Acquired hypothyroidism 10/16/2018  . Essential hypertension 10/16/2018  . H/O TIA (transient ischemic attack) and stroke 07/23/2015  . Poor concentration 07/23/2015  . B12 deficiency 07/23/2015  .  Hyperlipidemia 07/23/2015  . Insomnia 07/23/2015  . Multiple sclerosis ([place]) 07/20/2015  . Cephalalgia   . Congenital pectus excavatum 07/02/2011  . IBS (irritable bowel syndrome)   . Seizure disorder ([place])   . Depression   . Chronic back pain   . Chest pain   . Fibromyalgia     Gabriela Eves, PT, DPT 01/27/2020, 9:59 PM   [facility] [place], [place], [address] Phone: [phone]   Fax:  [phone]  Name: Colleen Sellers MRN: [id number] Date of Birth: [birth year]

## 2020-03-23 ENCOUNTER — Telehealth: Payer: Self-pay | Admitting: Family Medicine

## 2020-03-23 NOTE — Telephone Encounter (Signed)
Jury excuse note?

## 2020-03-24 NOTE — Telephone Encounter (Signed)
Done and given to [facility]

## 2020-03-24 NOTE — Telephone Encounter (Signed)
Patient says it was a form to be completed and she wants it faxed to number on the paper.

## 2020-03-28 NOTE — Telephone Encounter (Signed)
LETTER FAXED AS REQUESTED

## 2020-04-28 ENCOUNTER — Ambulatory Visit: Payer: Medicaid Other | Admitting: Family Medicine

## 2020-04-28 ENCOUNTER — Other Ambulatory Visit: Payer: Self-pay

## 2020-04-28 ENCOUNTER — Encounter: Payer: Self-pay | Admitting: Family Medicine

## 2020-04-28 VITALS — BP 153/92 | HR 92 | Temp 97.4°F | Ht 69.0 in | Wt 197.0 lb

## 2020-04-28 DIAGNOSIS — I10 Essential (primary) hypertension: Secondary | ICD-10-CM

## 2020-04-28 DIAGNOSIS — K5904 Chronic idiopathic constipation: Secondary | ICD-10-CM | POA: Diagnosis not present

## 2020-04-28 DIAGNOSIS — N951 Menopausal and female climacteric states: Secondary | ICD-10-CM

## 2020-04-28 DIAGNOSIS — Z114 Encounter for screening for human immunodeficiency virus [HIV]: Secondary | ICD-10-CM | POA: Diagnosis not present

## 2020-04-28 DIAGNOSIS — E039 Hypothyroidism, unspecified: Secondary | ICD-10-CM

## 2020-04-28 DIAGNOSIS — Z1159 Encounter for screening for other viral diseases: Secondary | ICD-10-CM | POA: Diagnosis not present

## 2020-04-28 DIAGNOSIS — E663 Overweight: Secondary | ICD-10-CM

## 2020-04-28 MED ORDER — LINACLOTIDE 145 MCG PO CAPS: 145.0000 ug | ORAL_CAPSULE | Freq: Every day | ORAL | 0 refills | Status: DC

## 2020-04-28 MED ORDER — PREMARIN 0.625 MG/GM VA CREA: TOPICAL_CREAM | VAGINAL | 12 refills | Status: DC

## 2020-04-28 MED ORDER — LISINOPRIL-HYDROCHLOROTHIAZIDE 10-12.5 MG PO TABS: 1.0000 | ORAL_TABLET | Freq: Every day | ORAL | 3 refills | Status: DC

## 2020-04-28 NOTE — Progress Notes (Signed)
Subjective: CC: Hypothyroidism PCP: Janora Norlander, DO HCW:CBJSE D Singleton is a 54 y.o. female presenting to clinic today for:  1.  Hypothyroidism Patient reports compliance with Synthroid 75 mcg daily.  She does report decreased energy, constipation and hair thinning.  No missed doses.  No tremor.  No heart palpitations.  2.  Constipation Patient is on chronic pain medicine, has hypothyroidism.  She was on Linzess previously would like to go back on it.  No hematochezia or melena.  3.  Hypertension Patient reports compliance with lisinopril 10 mg daily.  Blood pressures have been as high as 831 systolic recently.  She does not report any headaches, dizziness but she does feel that lisinopril causes some decreased energy.  She occasionally has lower extremity edema.  4.  Postmenopausal vaginal dryness Patient with history of hysterectomy.  She was previously on hormone replacement.  She does have history of migraine headaches and has history of TIA.  She is potentially going to start another romantic relationship and wanted to be proactive about this as her libido is somewhat low right now   ROS: Per HPI  Allergies  Allergen Reactions  . Ceftin Other (See Comments)    Unknown  . Codeine Nausea And Vomiting  . Morphine Hives and Itching  . Adhesive [Tape] Rash  . Latex Rash   Past Medical History:  Diagnosis Date  . Chest pain    Dyspnea, diaphoresis  . CHF (congestive heart failure) ([place])   . Chronic back pain   . Degenerative joint disease    Also scoliosis and pectus deformity  . Depression   . Fibromyalgia   . IBS (irritable bowel syndrome)    Mild hematochezia; negative for celiac disease; mild Schatzki's ring; small hiatal hernia; minor gastric erosions; few diverticula  . MS (multiple sclerosis) ([place])   . Overweight(278.02)    Negative sleep study-2010, but treated for narcolepsy  . Seizure disorder ([place])   . Surgical menopause 2002   age 62    Current  Outpatient Medications:  .  amphetamine-dextroamphetamine (ADDERALL) 15 MG tablet, Take 1 tablet by mouth 2 (two) times daily., Disp: , Rfl: 0 .  levothyroxine (SYNTHROID) 75 MCG tablet, Take 1 tablet (75 mcg total) by mouth daily., Disp: 30 tablet, Rfl: 2 .  lisinopril (ZESTRIL) 10 MG tablet, Take 1 tablet (10 mg total) by mouth daily. (Needs to be seen before next refill), Disp: 30 tablet, Rfl: 0 .  meloxicam (MOBIC) 15 MG tablet, TAKE 1 TABLET ORALLY EVERY DAY FOR ARTHRITIS, Disp: , Rfl: 5 .  oxyCODONE (OXY IR/ROXICODONE) 5 MG immediate release tablet, Take 5 mg by mouth every 4 (four) hours as needed for severe pain., Disp: , Rfl:  .  SUMAtriptan (IMITREX) 100 MG tablet, TAKE 1 TABLET ORALLY AS NEEDED, Disp: 10 tablet, Rfl: 5 Social History   Socioeconomic History  . Marital status: Divorced    Spouse name: Not on file  . Number of children: 4  . Years of education: Not on file  . Highest education level: Not on file  Occupational History  . Occupation: Disabled    Comment: Fibromyalgia, arthritis  Tobacco Use  . Smoking status: Never Smoker  . Smokeless tobacco: Never Used  Vaping Use  . Vaping Use: Never used  Substance and Sexual Activity  . Alcohol use: No    Alcohol/week: 0.0 standard drinks    Comment: minimal use  . Drug use: No  . Sexual activity: Not on file  Other  Topics Concern  . Not on file  Social History Narrative  . Not on file   Social Determinants of Health   Financial Resource Strain:   . Difficulty of Paying Living Expenses:   Food Insecurity:   . Worried About Charity fundraiser in the Last Year:   . Arboriculturist in the Last Year:   Transportation Needs:   . Film/video editor (Medical):   Marland Kitchen Lack of Transportation (Non-Medical):   Physical Activity:   . Days of Exercise per Week:   . Minutes of Exercise per Session:   Stress:   . Feeling of Stress :   Social Connections:   . Frequency of Communication with Friends and Family:   .  Frequency of Social Gatherings with Friends and Family:   . Attends Religious Services:   . Active Member of Clubs or Organizations:   . Attends Archivist Meetings:   Marland Kitchen Marital Status:   Intimate Partner Violence:   . Fear of Current or Ex-Partner:   . Emotionally Abused:   Marland Kitchen Physically Abused:   . Sexually Abused:    Family History  Problem Relation Age of Onset  . Depression Mother        suicide  . Alcohol abuse Father   . Coronary artery disease Father     Objective: Office vital signs reviewed. BP (!) 153/92   Pulse 92   Temp (!) 97.4 F (36.3 C) (Temporal)   Ht '5\' 9"'  (1.753 m)   Wt 197 lb (89.4 kg)   LMP 09/16/1994   BMI 29.09 kg/m   Physical Examination:  General: Awake, alert, well, No acute distress HEENT: Normal    Neck: No masses palpated. No lymphadenopathy; thyroid fullness but no goiter    Eyes: PERRLA, extraocular membranes intact, no exophthalmos    Throat: moist mucus membranes, Cardio: regular rate and rhythm, S1S2 heard, no murmurs appreciated Pulm: clear to auscultation bilaterally, no wheezes, rhonchi or rales; normal work of breathing on room air Extremities: warm, well perfused, No edema, cyanosis or clubbing; +2 pulses bilaterally MSK: Normal gait and station Skin: dry; intact; no rashes.  She has a healing excoriation noted along the left upper extremity; normal temperature Neuro: No tremor  Assessment/ Plan: 54 y.o. female   1. Acquired hypothyroidism Decreased energy, constipation.  Check thyroid panel - Thyroid Panel With TSH  2. Essential hypertension Not controlled.  Add HCTZ to her regimen. - Lipid Panel - CMP14+EGFR - lisinopril-hydrochlorothiazide (ZESTORETIC) 10-12.5 MG tablet; Take 1 tablet by mouth daily.  Dispense: 90 tablet; Refill: 3  3. Encounter for hepatitis C screening test for low risk patient - Hepatitis C antibody  4. Screening for HIV without presence of risk factors - HIV antibody (with  reflex)  5. Overweight (BMI 25.0-29.9) Check nonfasting lipid panel - Lipid Panel - CMP14+EGFR  6. Chronic idiopathic constipation Samples of Linzess provided.  She will contact me if this dose works well and I will renew - linaclotide (LINZESS) 145 MCG CAPS capsule; Take 1 capsule (145 mcg total) by mouth daily before breakfast.  Dispense: 8 capsule; Refill: 0  7. Vaginal dryness, menopausal Trial of Premarin topically.  I hesitate to add oral hormones given history of TIA, ongoing migraine headaches, requiring Imitrex.  I think that this would be too high risk for increase coagulopathy. - conjugated estrogens (PREMARIN) vaginal cream; Apply 1/2 gram vaginally 3 days per week.  May reduce to twice per week after 4 weeks.  Dispense: 42.5 g; Refill: 12   No orders of the defined types were placed in this encounter.  No orders of the defined types were placed in this encounter.    Janora Norlander, DO [place] [phone]

## 2020-04-29 LAB — CMP14+EGFR
ALT: 15 IU/L (ref 0–32)
AST: 16 IU/L (ref 0–40)
Albumin/Globulin Ratio: 1.8 (ref 1.2–2.2)
Albumin: 5.1 g/dL — ABNORMAL HIGH (ref 3.8–4.9)
Alkaline Phosphatase: 96 IU/L (ref 48–121)
BUN/Creatinine Ratio: 12 (ref 9–23)
BUN: 13 mg/dL (ref 6–24)
Bilirubin Total: 0.3 mg/dL (ref 0.0–1.2)
CO2: 25 mmol/L (ref 20–29)
Calcium: 10.3 mg/dL — ABNORMAL HIGH (ref 8.7–10.2)
Chloride: 99 mmol/L (ref 96–106)
Creatinine, Ser: 1.11 mg/dL — ABNORMAL HIGH (ref 0.57–1.00)
GFR calc Af Amer: 64 mL/min/{1.73_m2} (ref >59)
GFR calc non Af Amer: 56 mL/min/{1.73_m2} — ABNORMAL LOW (ref >59)
Globulin, Total: 2.8 g/dL (ref 1.5–4.5)
Glucose: 100 mg/dL — ABNORMAL HIGH (ref 65–99)
Potassium: 4.1 mmol/L (ref 3.5–5.2)
Sodium: 140 mmol/L (ref 134–144)
Total Protein: 7.9 g/dL (ref 6.0–8.5)

## 2020-04-29 LAB — THYROID PANEL WITH TSH
Free Thyroxine Index: 2.4 (ref 1.2–4.9)
T3 Uptake Ratio: 25 % (ref 24–39)
T4, Total: 9.6 ug/dL (ref 4.5–12.0)
TSH: 3.75 u[IU]/mL (ref 0.450–4.500)

## 2020-04-29 LAB — LIPID PANEL
Chol/HDL Ratio: 3.5 ratio (ref 0.0–4.4)
Cholesterol, Total: 265 mg/dL — ABNORMAL HIGH (ref 100–199)
HDL: 76 mg/dL (ref >39)
LDL Chol Calc (NIH): 176 mg/dL — ABNORMAL HIGH (ref 0–99)
Triglycerides: 80 mg/dL (ref 0–149)
VLDL Cholesterol Cal: 13 mg/dL (ref 5–40)

## 2020-04-29 LAB — HEPATITIS C ANTIBODY: Hep C Virus Ab: <0.1 s/co ratio (ref 0.0–0.9)

## 2020-04-29 LAB — HIV ANTIBODY (ROUTINE TESTING W REFLEX): HIV Screen 4th Generation wRfx: NONREACTIVE

## 2020-06-19 ENCOUNTER — Other Ambulatory Visit: Payer: Self-pay | Admitting: Family Medicine

## 2020-07-04 ENCOUNTER — Telehealth: Payer: Self-pay

## 2020-07-04 NOTE — Telephone Encounter (Signed)
Appointment scheduled.

## 2020-07-04 NOTE — Telephone Encounter (Signed)
  Incoming Patient Call  07/04/2020  What symptoms do you have? Whole side of face is swollen has abscess tooth  How long have you been sick? Started hurting two nights ago  Have you been seen for this problem? no  If your provider decides to give you a prescription, which pharmacy would you like for it to be sent to? [facility]    Patient informed that this information will be sent to the clinical staff for review and that they should receive a follow up call.

## 2020-07-05 ENCOUNTER — Ambulatory Visit: Payer: Medicaid Other | Admitting: Family Medicine

## 2020-07-05 ENCOUNTER — Other Ambulatory Visit: Payer: Self-pay

## 2020-07-05 VITALS — BP 187/115 | HR 94 | Temp 96.9°F | Ht 69.0 in | Wt 190.0 lb

## 2020-07-05 DIAGNOSIS — K047 Periapical abscess without sinus: Secondary | ICD-10-CM | POA: Diagnosis not present

## 2020-07-05 MED ORDER — AMOXICILLIN-POT CLAVULANATE 875-125 MG PO TABS: 1.0000 | ORAL_TABLET | Freq: Two times a day (BID) | ORAL | 0 refills | Status: DC

## 2020-07-05 MED ORDER — KETOROLAC TROMETHAMINE 30 MG/ML IJ SOLN
30.0000 mg | Freq: Once | INTRAMUSCULAR | Status: AC
  Administered 2020-07-05: 30 mg via INTRAMUSCULAR

## 2020-07-05 NOTE — Patient Instructions (Signed)
Dental Abscess  A dental abscess is an area of pus in or around a tooth. It comes from an infection. It can cause pain and other symptoms. Treatment will help with symptoms and prevent the infection from spreading. Follow these instructions at home: Medicines  Take over-the-counter and prescription medicines only as told by your dentist.  If you were prescribed an antibiotic medicine, take it as told by your dentist. Do not stop taking it even if you start to feel better.  If you were prescribed a gel that has numbing medicine in it, use it exactly as told.  Do not drive or use heavy machinery (like a lawn mower) while taking prescription pain medicine. General instructions  Rinse out your mouth often with salt water. ? To make salt water, dissolve -1 tsp of salt in 1 cup of warm water.  Eat a soft diet while your mouth is healing.  Drink enough fluid to keep your urine pale yellow.  Do not apply heat to the outside of your mouth.  Do not use any products that contain nicotine or tobacco. These include cigarettes and e-cigarettes. If you need help quitting, ask your doctor.  Keep all follow-up visits as told by your dentist. This is important. Prevent an abscess  Brush your teeth every morning and every night. Use fluoride toothpaste.  Floss your teeth each day.  Get dental cleanings as often as told by your dentist.  Think about getting dental sealant put on teeth that have deep holes (decay).  Drink water that has fluoride in it. ? Most tap water has fluoride. ? Check the label on bottled water to see if it has fluoride in it.  Drink water instead of sugary drinks.  Eat healthy meals and snacks.  Wear a mouth guard or face shield when you play sports. Contact a doctor if:  Your pain is worse, and medicine does not help. Get help right away if:  You have a fever or chills.  Your symptoms suddenly get worse.  You have a very bad headache.  You have problems  breathing or swallowing.  You have trouble opening your mouth.  You have swelling in your neck or close to your eye. Summary  A dental abscess is an area of pus in or around a tooth. It is caused by an infection.  Treatment will help with symptoms and prevent the infection from spreading.  Take over-the-counter and prescription medicines only as told by your dentist.  To prevent an abscess, take good care of your teeth. Brush your teeth every morning and night. Use floss every day.  Get dental cleanings as often as told by your dentist. This information is not intended to replace advice given to you by your health care provider. Make sure you discuss any questions you have with your health care provider. Document Revised: 12/23/2018 Document Reviewed: 05/05/2017 Elsevier Patient Education  2020 [employer]  

## 2020-07-05 NOTE — Progress Notes (Signed)
Subjective: CC: Abscessed tooth PCP: Colleen Ip, DO XQJ:JHERD D Hopson is a 54 y.o. female presenting to clinic today for:  1.  Abscessed tooth Patient reports that she started having quite a bit of swelling and pain in her gum over the last several days.  She is called all-around cannot find a dentist that will take Medicaid to help her with this issue.  She reports pain refractory to OTC analgesics and her prescribed pain medication by pain management.  She has had associated facial swelling.  She reports fevers at nighttime.   ROS: Per HPI  Allergies  Allergen Reactions  . Ceftin Other (See Comments)    Unknown  . Codeine Nausea And Vomiting  . Morphine Hives and Itching  . Adhesive [Tape] Rash  . Latex Rash   Past Medical History:  Diagnosis Date  . Chest pain    Dyspnea, diaphoresis  . CHF (congestive heart failure) (HCC)   . Chronic back pain   . Degenerative joint disease    Also scoliosis and pectus deformity  . Depression   . Fibromyalgia   . IBS (irritable bowel syndrome)    Mild hematochezia; negative for celiac disease; mild Schatzki's ring; small hiatal hernia; minor gastric erosions; few diverticula  . MS (multiple sclerosis) (HCC)   . Overweight(278.02)    Negative sleep study-2010, but treated for narcolepsy  . Seizure disorder (HCC)   . Surgical menopause 2002   age 7    Current Outpatient Medications:  .  amphetamine-dextroamphetamine (ADDERALL) 15 MG tablet, Take 1 tablet by mouth 2 (two) times daily., Disp: , Rfl: 0 .  conjugated estrogens (PREMARIN) vaginal cream, Apply 1/2 gram vaginally 3 days per week.  May reduce to twice per week after 4 weeks., Disp: 42.5 g, Rfl: 12 .  cyanocobalamin (,VITAMIN B-12,) 1000 MCG/ML injection, SMARTSIG:1 Milliliter(s) SUB-Q Twice Monthly, Disp: , Rfl:  .  levothyroxine (SYNTHROID) 75 MCG tablet, TAKE 1 TABLET BY MOUTH EVERY DAY, Disp: 90 tablet, Rfl: 3 .  linaclotide (LINZESS) 145 MCG CAPS capsule, Take  1 capsule (145 mcg total) by mouth daily before breakfast., Disp: 8 capsule, Rfl: 0 .  lisinopril-hydrochlorothiazide (ZESTORETIC) 10-12.5 MG tablet, Take 1 tablet by mouth daily., Disp: 90 tablet, Rfl: 3 .  oxyCODONE (OXY IR/ROXICODONE) 5 MG immediate release tablet, Take 5 mg by mouth every 4 (four) hours as needed for severe pain., Disp: , Rfl:  .  SUMAtriptan (IMITREX) 100 MG tablet, TAKE 1 TABLET ORALLY AS NEEDED, Disp: 10 tablet, Rfl: 5 .  tiZANidine (ZANAFLEX) 4 MG tablet, Take by mouth., Disp: , Rfl:  Social History   Socioeconomic History  . Marital status: Divorced    Spouse name: Not on file  . Number of children: 4  . Years of education: Not on file  . Highest education level: Not on file  Occupational History  . Occupation: Disabled    Comment: Fibromyalgia, arthritis  Tobacco Use  . Smoking status: Never Smoker  . Smokeless tobacco: Never Used  Vaping Use  . Vaping Use: Never used  Substance and Sexual Activity  . Alcohol use: No    Alcohol/week: 0.0 standard drinks    Comment: minimal use  . Drug use: No  . Sexual activity: Not on file  Other Topics Concern  . Not on file  Social History Narrative  . Not on file   Social Determinants of Health   Financial Resource Strain:   . Difficulty of Paying Living Expenses: Not on file  Food Insecurity:   . Worried About Programme researcher, broadcasting/film/video in the Last Year: Not on file  . Ran Out of Food in the Last Year: Not on file  Transportation Needs:   . Lack of Transportation (Medical): Not on file  . Lack of Transportation (Non-Medical): Not on file  Physical Activity:   . Days of Exercise per Week: Not on file  . Minutes of Exercise per Session: Not on file  Stress:   . Feeling of Stress : Not on file  Social Connections:   . Frequency of Communication with Friends and Family: Not on file  . Frequency of Social Gatherings with Friends and Family: Not on file  . Attends Religious Services: Not on file  . Active Member  of Clubs or Organizations: Not on file  . Attends Banker Meetings: Not on file  . Marital Status: Not on file  Intimate Partner Violence:   . Fear of Current or Ex-Partner: Not on file  . Emotionally Abused: Not on file  . Physically Abused: Not on file  . Sexually Abused: Not on file   Family History  Problem Relation Age of Onset  . Depression Mother        suicide  . Alcohol abuse Father   . Coronary artery disease Father     Objective: Office vital signs reviewed. BP (!) 187/115   Pulse 94   Temp (!) 96.9 F (36.1 C) (Temporal)   Ht 5\' 9"  (1.753 m)   Wt 190 lb (86.2 kg)   LMP 09/16/1994   SpO2 97%   BMI 28.06 kg/m   Physical Examination:  General: Awake, alert, appears very uncomfortable HEENT: Left-sided facial swelling over the maxillary sinus.  She has a visible abscess over the incisor on the left.  There is tenderness.  There is no appreciable drainage.  She is able to move her eyes without any pain.  Assessment/ Plan: 54 y.o. female   1. Abscessed tooth Toradol injection given given substantial pain refractory to opioid medications and OTC meds.  Encouraged icing of the affected area.  Start Augmentin.  We discussed that the gold standard of care would be to attempt to drain the abscess.  However, she cannot get into a dentist.  I attempted to order a stat orthopantogram but unfortunately this is not obtainable in this county.  We will see if we can get it in the next county over.  We also tried calling around to find a dentist that would take Medicaid but were unsuccessful in finding one that would get her in soon.  I gave her a list of dentists in [place] and [address] that accept medicaid.  We discussed red flag signs and symptoms and reasons for emergent evaluation emergency department.  She voiced good understanding. - ketorolac (TORADOL) 30 MG/ML injection 30 mg - amoxicillin-clavulanate (AUGMENTIN) 875-125 MG tablet; Take 1 tablet by mouth 2  (two) times daily.  Dispense: 20 tablet; Refill: 0 - DG Orthopantogram; Future   No orders of the defined types were placed in this encounter.  No orders of the defined types were placed in this encounter.    [place], DO [facility] [phone]

## 2020-07-08 IMAGING — MG DIGITAL DIAGNOSTIC BILATERAL MAMMOGRAM WITH TOMO AND CAD
6 of 10 series · 6 of 30 positions shown · non-contrast
Comparison: Mammography 01/24/2017, 10/11/2008.

CLINICAL DATA: Focal pain involving the LOWER LEFT breast. Annual
evaluation, RIGHT breast.

EXAM:
DIGITAL DIAGNOSTIC BILATERAL MAMMOGRAM WITH CAD AND TOMO
ULTRASOUND LEFT BREAST

[L MLO synth-2D]
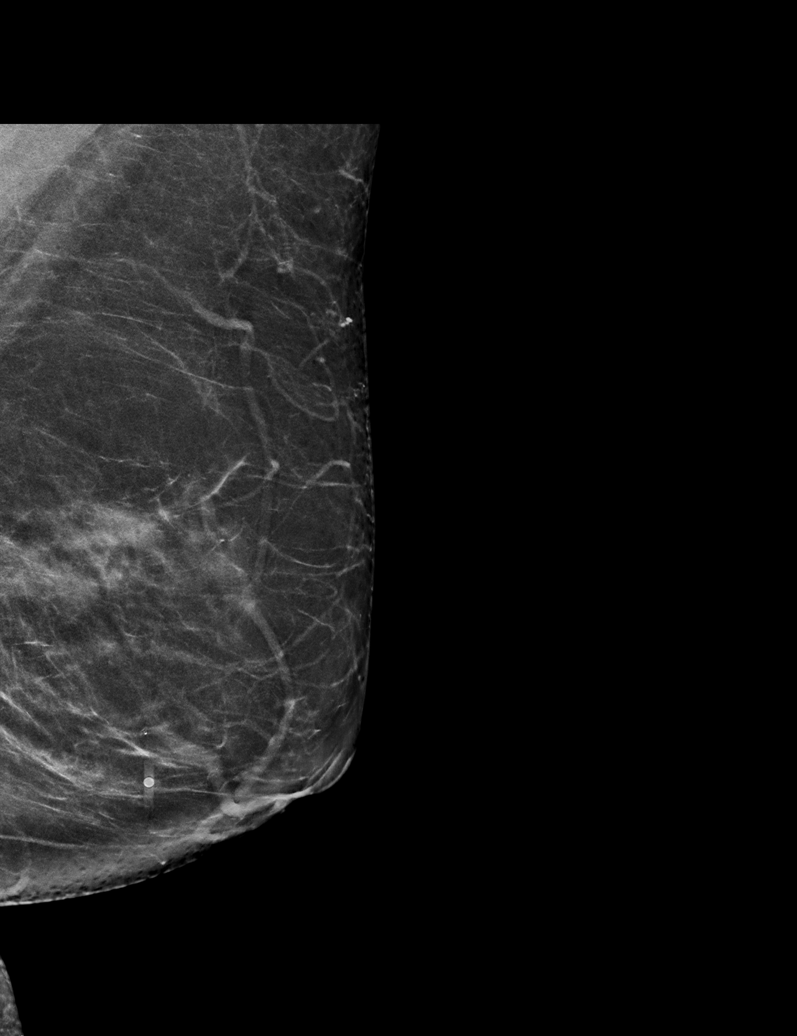

[L TAN synth-2D]
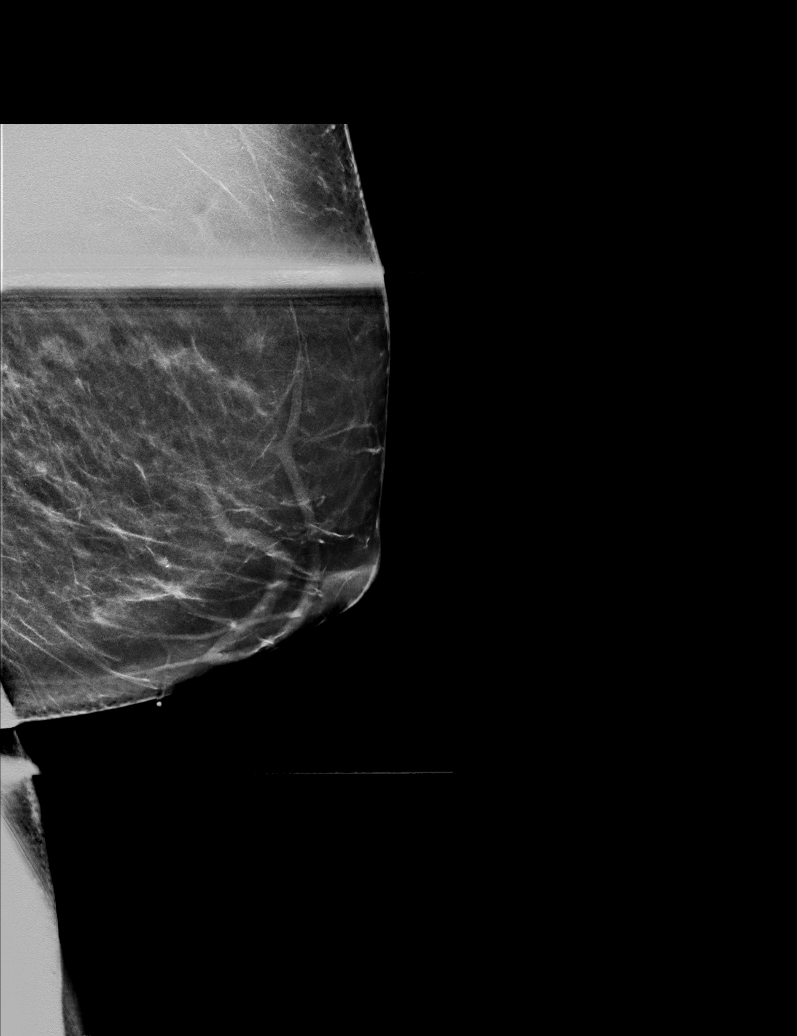

[R MLO synth-2D]
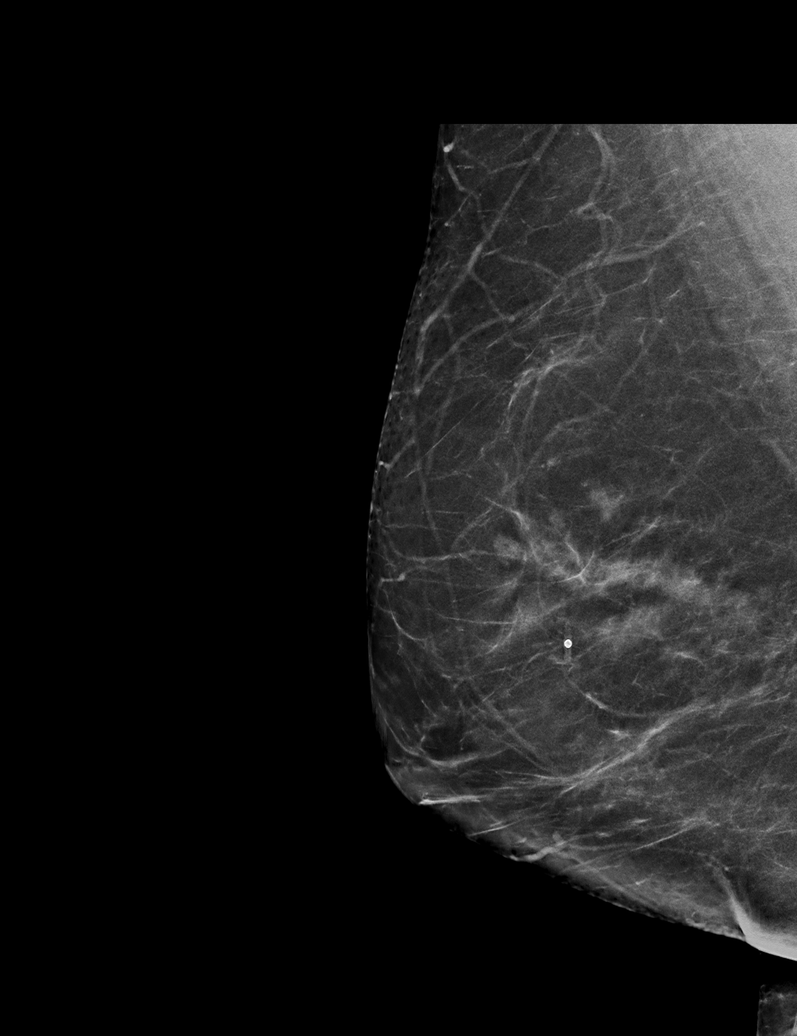

[R CC synth-2D]
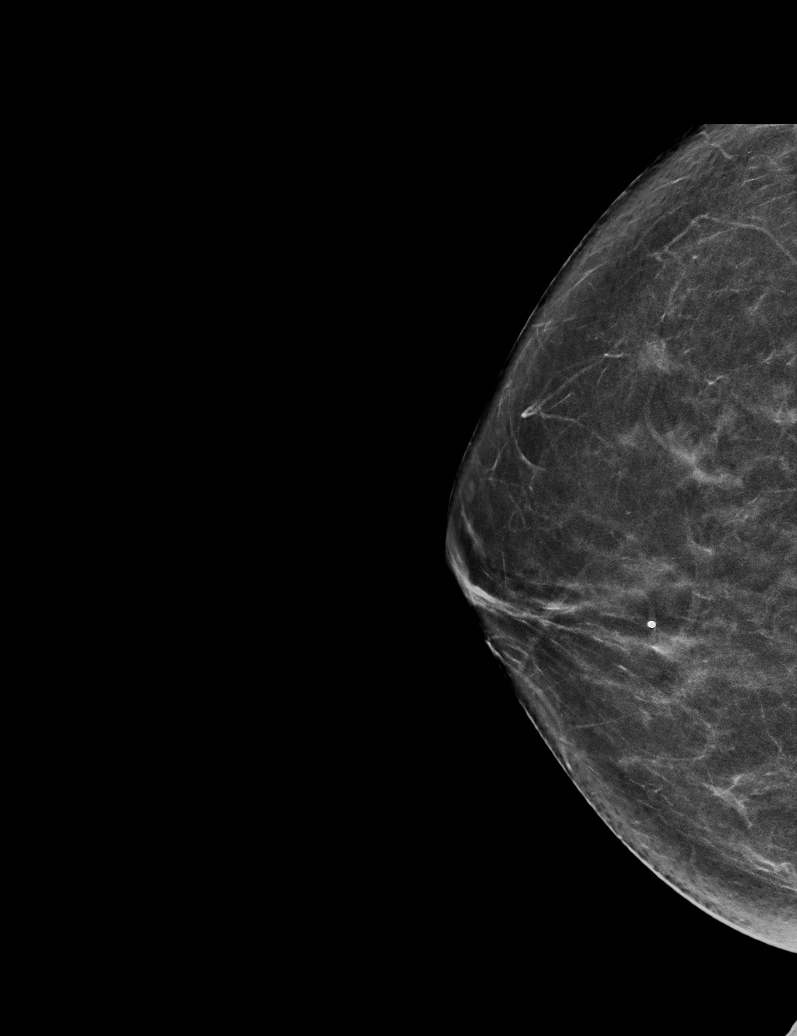

[L CC synth-2D]
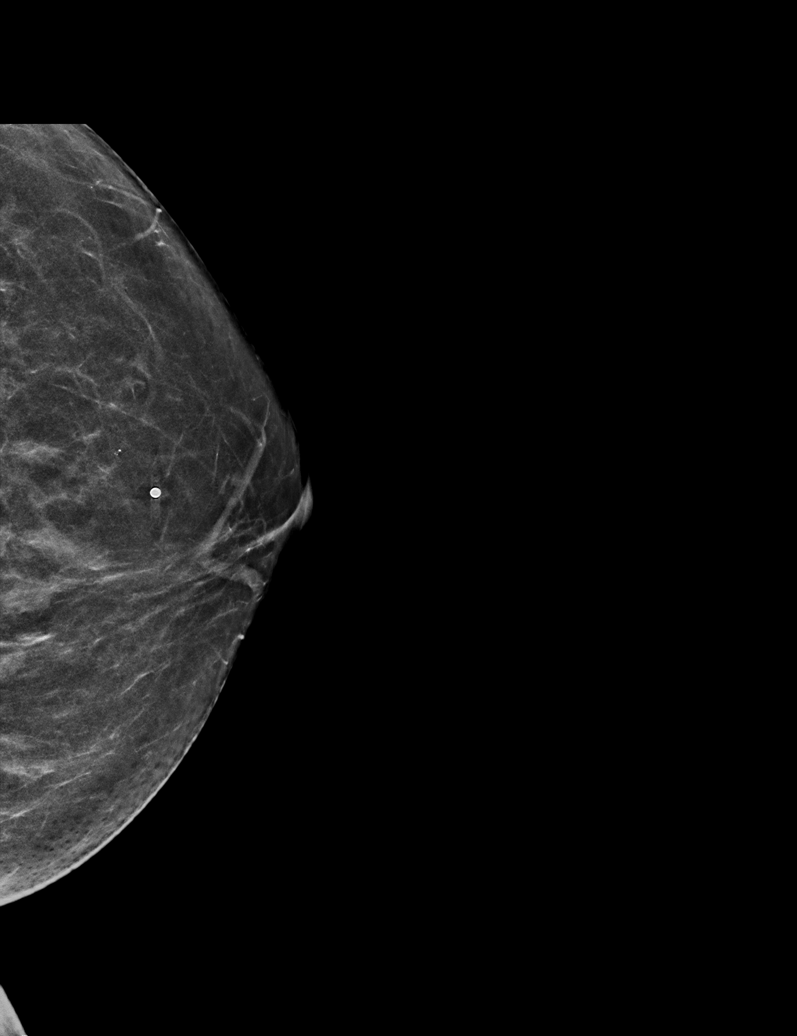

[R MLO tomo · tomo slice 37/72.0]
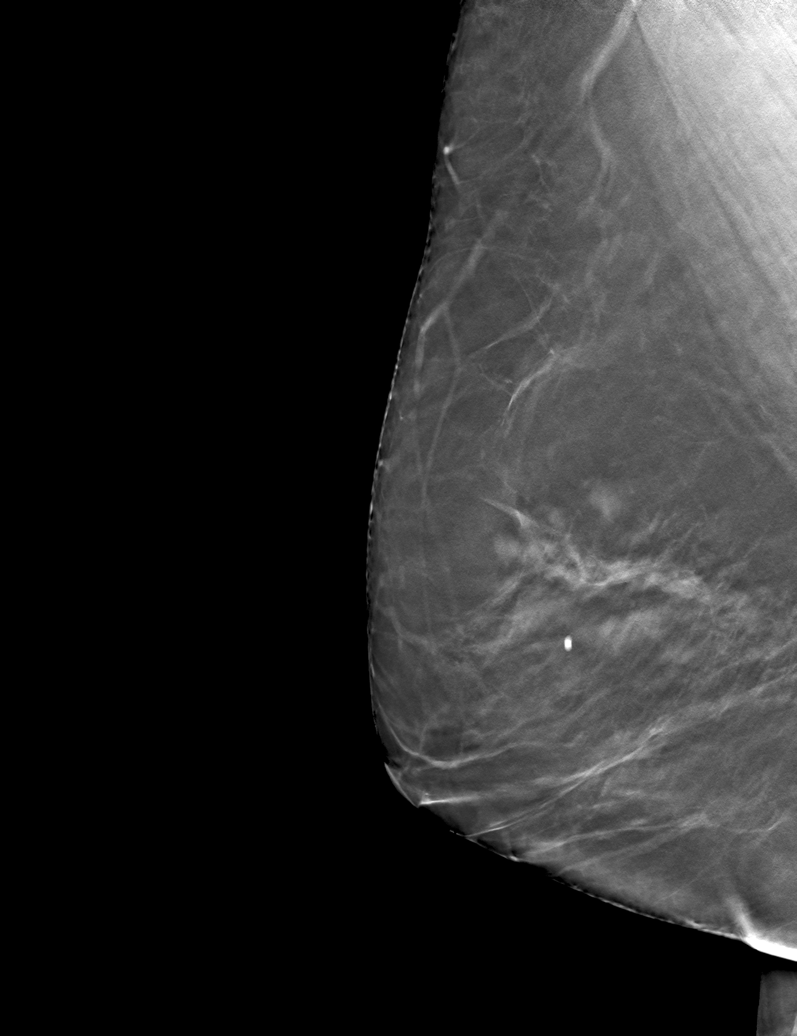

[6 of 30 positions shown; findings below may reference images not displayed]

No prior LEFT
breast ultrasound.

ACR Breast Density Category b: There are scattered areas of
fibroglandular density.
FINDINGS: Tomosynthesis and synthesized full field CC and MLO views of both
breasts were obtained. Tomosynthesis and synthesized spot
compression tangential view of the area of concern in the LEFT
breast was also obtained.

No suspicious findings in the area of focal pain in the LOWER LEFT
breast. An island of normal fibroglandular tissue is present in this
location.

No findings suspicious for malignancy in either breast.

Mammographic images were processed with CAD.

On physical exam, there is no palpable abnormality in the LOWER LEFT
breast. The patient does describe tenderness to palpation.

Targeted LEFT breast ultrasound is performed, showing an island of
normal fibroglandular tissue at the 6 o'clock position approximately
3 cm from the nipple in the area of focal pain. No cyst, solid mass
or abnormal acoustic shadowing is identified.
IMPRESSION: 1. No mammographic or sonographic evidence of malignancy involving
the LEFT breast.
2. No mammographic evidence of malignancy involving the RIGHT
breast.

RECOMMENDATION:
Screening mammogram in one year.(Code:[id number]-G-ZQ8)

Strategies for alleviating breast pain including decreasing caffeine
intake, vitamin-E supplementation, and avoidance of tight
compression brassieres, including underwire brassieres, were
discussed with the patient.

I have discussed the findings and recommendations with the patient.
Results were also provided in writing at the conclusion of the
visit. If applicable, a reminder letter will be sent to the patient
regarding the next appointment.

BI-RADS CATEGORY  1: Negative.

## 2020-07-17 ENCOUNTER — Telehealth: Payer: Self-pay

## 2020-07-17 NOTE — Telephone Encounter (Signed)
Pt called stating that she has been on an antibiotic for tooth ache and says Dr Nadine Counts was supposed to call her in a Rx for Diflucan so that she doesn't get a yeast infection while taking the antibiotic. Says Dr Nadine Counts didn't send the Rx and is requesting it be sent to [facility] [place].  Pt also says that Dr Nadine Counts was supposed to send in a new script for her cholesterol but didn't do that either.

## 2020-07-18 MED ORDER — ATORVASTATIN CALCIUM 10 MG PO TABS
10.0000 mg | ORAL_TABLET | Freq: Every day | ORAL | 3 refills | Status: DC
Start: 2020-07-18 — End: 2021-05-02

## 2020-07-18 MED ORDER — FLUCONAZOLE 150 MG PO TABS: 150.0000 mg | ORAL_TABLET | Freq: Once | ORAL | 0 refills | Status: AC

## 2020-07-18 NOTE — Telephone Encounter (Signed)
According to the notes, Doyne Keel called patient and LVM re: cholesterol.  There is nothing that denotes a call back. I'm glad to send both a yeast pill in and the recommended Lipitor now that I know she is willing to take it.

## 2020-07-18 NOTE — Telephone Encounter (Signed)
Patient aware and verbalizes understanding. 

## 2020-08-24 DIAGNOSIS — M533 Sacrococcygeal disorders, not elsewhere classified: Secondary | ICD-10-CM | POA: Diagnosis not present

## 2020-08-24 DIAGNOSIS — M5136 Other intervertebral disc degeneration, lumbar region: Secondary | ICD-10-CM | POA: Diagnosis not present

## 2020-08-24 DIAGNOSIS — Z79891 Long term (current) use of opiate analgesic: Secondary | ICD-10-CM | POA: Diagnosis not present

## 2020-08-24 DIAGNOSIS — M47816 Spondylosis without myelopathy or radiculopathy, lumbar region: Secondary | ICD-10-CM | POA: Diagnosis not present

## 2020-11-18 DIAGNOSIS — L0201 Cutaneous abscess of face: Secondary | ICD-10-CM | POA: Diagnosis not present

## 2020-12-01 ENCOUNTER — Other Ambulatory Visit: Payer: Self-pay

## 2020-12-01 ENCOUNTER — Encounter: Payer: Self-pay | Admitting: Family Medicine

## 2020-12-01 ENCOUNTER — Ambulatory Visit: Payer: Medicaid Other | Admitting: Family Medicine

## 2020-12-01 VITALS — BP 135/83 | HR 82 | Ht 69.0 in | Wt 190.0 lb

## 2020-12-01 DIAGNOSIS — L03211 Cellulitis of face: Secondary | ICD-10-CM | POA: Diagnosis not present

## 2020-12-01 MED ORDER — SULFAMETHOXAZOLE-TRIMETHOPRIM 800-160 MG PO TABS: 1.0000 | ORAL_TABLET | Freq: Two times a day (BID) | ORAL | 0 refills | Status: DC

## 2020-12-01 NOTE — Progress Notes (Signed)
BP 135/83   Pulse 82   Ht 5\' 9"  (1.753 m)   Wt 190 lb (86.2 kg)   LMP 09/16/1994   SpO2 97%   BMI 28.06 kg/m    Subjective:   Patient ID: Colleen Sellers, female    DOB: 1966/08/07, 55 y.o.   MRN: [id number]  HPI: Colleen Sellers is a 55 y.o. female presenting on 12/01/2020 for staph infection (Left chin area. Tx by injection and oral atb. Feels like it is still there. Red, hard spot, some swelling)   HPI Left chin swelling and redness recheck Patient is coming in for follow-up from urgent care after diagnosed with a cellulitis and staph infection left chin, she was given a antibiotic shot and then given Bactrim to go home with.  She finished this up about 5 days ago.  She still feels like she has some swelling and pain there although it is much improved.  She denies any fevers or chills.  Relevant past medical, surgical, family and social history reviewed and updated as indicated. Interim medical history since our last visit reviewed. Allergies and medications reviewed and updated.  Review of Systems  Constitutional: Negative for chills and fever.  Eyes: Negative for visual disturbance.  Respiratory: Negative for chest tightness and shortness of breath.   Skin: Positive for color change. Negative for rash and wound.  Psychiatric/Behavioral: Negative for agitation and behavioral problems.  All other systems reviewed and are negative.   Per HPI unless specifically indicated above   Allergies as of 12/01/2020      Reactions   Ceftin Other (See Comments)   Unknown   Codeine Nausea And Vomiting   Morphine Hives, Itching   Adhesive [tape] Rash   Latex Rash      Medication List       Accurate as of December 01, 2020  2:20 PM. If you have any questions, ask your nurse or doctor.        STOP taking these medications   amoxicillin-clavulanate 875-125 MG tablet Commonly known as: AUGMENTIN Stopped by: December 03, 2020 Dettinger, MD   linaclotide 145 MCG Caps capsule Commonly known  as: Linzess Stopped by: Elige Radon Dettinger, MD     TAKE these medications   amphetamine-dextroamphetamine 15 MG tablet Commonly known as: ADDERALL Take 1 tablet by mouth 2 (two) times daily.   atorvastatin 10 MG tablet Commonly known as: LIPITOR Take 1 tablet (10 mg total) by mouth daily.   cyanocobalamin 1000 MCG/ML injection Commonly known as: (VITAMIN B-12) SMARTSIG:1 Milliliter(s) SUB-Q Twice Monthly   levothyroxine 75 MCG tablet Commonly known as: SYNTHROID TAKE 1 TABLET BY MOUTH EVERY DAY   lisinopril-hydrochlorothiazide 10-12.5 MG tablet Commonly known as: ZESTORETIC Take 1 tablet by mouth daily.   oxyCODONE 5 MG immediate release tablet Commonly known as: Oxy IR/ROXICODONE Take 5 mg by mouth every 4 (four) hours as needed for severe pain.   Premarin vaginal cream Generic drug: conjugated estrogens Apply 1/2 gram vaginally 3 days per week.  May reduce to twice per week after 4 weeks.   SUMAtriptan 100 MG tablet Commonly known as: IMITREX TAKE 1 TABLET ORALLY AS NEEDED        Objective:   BP 135/83   Pulse 82   Ht 5\' 9"  (1.753 m)   Wt 190 lb (86.2 kg)   LMP 09/16/1994   SpO2 97%   BMI 28.06 kg/m   Wt Readings from Last 3 Encounters:  12/01/20 190 lb (86.2 kg)  07/05/20  190 lb (86.2 kg)  04/28/20 197 lb (89.4 kg)    Physical Exam Vitals and nursing note reviewed.  Constitutional:      General: She is not in acute distress.    Appearance: She is well-developed. She is not diaphoretic.  Eyes:     Conjunctiva/sclera: Conjunctivae normal.  Skin:    General: Skin is warm and dry.     Findings: Erythema and lesion (Small area of redness and induration, no fluctuation on her left jawline just under her chin.  Slightly tender) present. No rash.  Neurological:     Mental Status: She is alert and oriented to person, place, and time.     Coordination: Coordination normal.  Psychiatric:        Behavior: Behavior normal.       Assessment & Plan:    Problem List Items Addressed This Visit   None   Visit Diagnoses    Cellulitis of face    -  Primary   Relevant Medications   sulfamethoxazole-trimethoprim (BACTRIM DS) 800-160 MG tablet      Will do another 10-day course of Bactrim, looks like it is clearing but not completely cleared. Follow up plan: Return if symptoms worsen or fail to improve.  Counseling provided for all of the vaccine components No orders of the defined types were placed in this encounter.   Arville Care, MD [facility] 12/01/2020, 2:20 PM

## 2020-12-27 DIAGNOSIS — M533 Sacrococcygeal disorders, not elsewhere classified: Secondary | ICD-10-CM | POA: Diagnosis not present

## 2020-12-27 DIAGNOSIS — M47816 Spondylosis without myelopathy or radiculopathy, lumbar region: Secondary | ICD-10-CM | POA: Diagnosis not present

## 2021-04-04 ENCOUNTER — Other Ambulatory Visit: Payer: Self-pay

## 2021-04-04 DIAGNOSIS — N644 Mastodynia: Secondary | ICD-10-CM

## 2021-04-04 DIAGNOSIS — N6320 Unspecified lump in the left breast, unspecified quadrant: Secondary | ICD-10-CM

## 2021-04-11 DIAGNOSIS — Z79891 Long term (current) use of opiate analgesic: Secondary | ICD-10-CM | POA: Diagnosis not present

## 2021-04-11 DIAGNOSIS — M47816 Spondylosis without myelopathy or radiculopathy, lumbar region: Secondary | ICD-10-CM | POA: Diagnosis not present

## 2021-04-11 DIAGNOSIS — G894 Chronic pain syndrome: Secondary | ICD-10-CM | POA: Diagnosis not present

## 2021-04-11 DIAGNOSIS — M7918 Myalgia, other site: Secondary | ICD-10-CM | POA: Diagnosis not present

## 2021-04-11 DIAGNOSIS — M5136 Other intervertebral disc degeneration, lumbar region: Secondary | ICD-10-CM | POA: Diagnosis not present

## 2021-04-11 DIAGNOSIS — M533 Sacrococcygeal disorders, not elsewhere classified: Secondary | ICD-10-CM | POA: Diagnosis not present

## 2021-04-25 DIAGNOSIS — N6310 Unspecified lump in the right breast, unspecified quadrant: Secondary | ICD-10-CM | POA: Diagnosis not present

## 2021-04-25 DIAGNOSIS — R922 Inconclusive mammogram: Secondary | ICD-10-CM | POA: Diagnosis not present

## 2021-04-25 DIAGNOSIS — N6320 Unspecified lump in the left breast, unspecified quadrant: Secondary | ICD-10-CM | POA: Diagnosis not present

## 2021-05-02 ENCOUNTER — Other Ambulatory Visit: Payer: Self-pay

## 2021-05-02 ENCOUNTER — Ambulatory Visit: Payer: Medicaid Other | Admitting: Family Medicine

## 2021-05-02 ENCOUNTER — Encounter: Payer: Self-pay | Admitting: Family Medicine

## 2021-05-02 VITALS — BP 126/78 | HR 63 | Temp 97.8°F | Ht 69.0 in | Wt 200.6 lb

## 2021-05-02 DIAGNOSIS — R34 Anuria and oliguria: Secondary | ICD-10-CM

## 2021-05-02 DIAGNOSIS — F909 Attention-deficit hyperactivity disorder, unspecified type: Secondary | ICD-10-CM | POA: Diagnosis not present

## 2021-05-02 DIAGNOSIS — E7800 Pure hypercholesterolemia, unspecified: Secondary | ICD-10-CM | POA: Diagnosis not present

## 2021-05-02 DIAGNOSIS — G40909 Epilepsy, unspecified, not intractable, without status epilepticus: Secondary | ICD-10-CM

## 2021-05-02 DIAGNOSIS — Z1331 Encounter for screening for depression: Secondary | ICD-10-CM

## 2021-05-02 DIAGNOSIS — G35 Multiple sclerosis: Secondary | ICD-10-CM

## 2021-05-02 DIAGNOSIS — I10 Essential (primary) hypertension: Secondary | ICD-10-CM | POA: Diagnosis not present

## 2021-05-02 DIAGNOSIS — E039 Hypothyroidism, unspecified: Secondary | ICD-10-CM | POA: Diagnosis not present

## 2021-05-02 MED ORDER — AMPHETAMINE-DEXTROAMPHETAMINE 15 MG PO TABS: 15.0000 mg | ORAL_TABLET | Freq: Every day | ORAL | 0 refills | Status: DC

## 2021-05-02 MED ORDER — AMPHETAMINE-DEXTROAMPHETAMINE 15 MG PO TABS: 1.0000 | ORAL_TABLET | Freq: Two times a day (BID) | ORAL | 0 refills | Status: DC

## 2021-05-02 NOTE — Progress Notes (Signed)
Subjective: CC: Follow-up hypothyroidism, hypertension and hyperlipidemia PCP: Janora Norlander, DO YHC:WCBJS D Kasprzak is a 55 y.o. female presenting to clinic today for:  1.  Hypothyroidism Patient is compliant with Synthroid 75 mcg daily.  She does report chronic fatigue but has been treated with Adderall 15 mg by her neurologist twice daily for this and for adult onset ADHD.  She unfortunately has not been able to see her neurologist in [place] anymore due to him not taking her insurance.  She would like to try to reestablish care with Dr. Merlene Laughter, she was previously seeing him.  She does take biotin hair skin nail products but will hold off on this before her next set of labs.  She is asking that I bridge her Adderall whilst she waits for neurology referral  2.  Multiple sclerosis Patient is followed by pain management and prescribed pain medications.  She reports chronic constipation is stable.  Needs new neurologist as above  3.  Hypertension with hyperlipidemia/change in urine output Patient was compliant with her lisinopril-hydrochlorothiazide but she started feeling poorly on this as she has been off of it for quite some time now.  She stopped taking Lipitor because she was concerned about possible side effects of the medication.  No chest pain, shortness of breath.  There is a family history of early cardiovascular disease in her father.  She reports some change in urine output that has been waxing and waning.  At 1 point she felt excruciating pain in past some granules.  No blood or dysuria now.   ROS: Per HPI  Allergies  Allergen Reactions   Ceftin Other (See Comments)    Unknown   Codeine Nausea And Vomiting   Morphine Hives and Itching   Adhesive [Tape] Rash   Latex Rash   Past Medical History:  Diagnosis Date   Chest pain    Dyspnea, diaphoresis   CHF (congestive heart failure) (HCC)    Chronic back pain    Degenerative joint disease    Also scoliosis and  pectus deformity   Depression    Fibromyalgia    IBS (irritable bowel syndrome)    Mild hematochezia; negative for celiac disease; mild Schatzki's ring; small hiatal hernia; minor gastric erosions; few diverticula   MS (multiple sclerosis) (HCC)    Overweight(278.02)    Negative sleep study-2010, but treated for narcolepsy   Seizure disorder [facility])    Surgical menopause 2002   age 34    Current Outpatient Medications:    amphetamine-dextroamphetamine (ADDERALL) 15 MG tablet, Take 1 tablet by mouth 2 (two) times daily., Disp: , Rfl: 0   conjugated estrogens (PREMARIN) vaginal cream, Apply 1/2 gram vaginally 3 days per week.  May reduce to twice per week after 4 weeks., Disp: 42.5 g, Rfl: 12   levothyroxine (SYNTHROID) 75 MCG tablet, TAKE 1 TABLET BY MOUTH EVERY DAY, Disp: 90 tablet, Rfl: 3   oxyCODONE (OXY IR/ROXICODONE) 5 MG immediate release tablet, Take 5 mg by mouth every 4 (four) hours as needed for severe pain., Disp: , Rfl:    oxyCODONE ER (XTAMPZA ER) 9 MG C12A, Take by mouth., Disp: , Rfl:    atorvastatin (LIPITOR) 10 MG tablet, Take 1 tablet (10 mg total) by mouth daily. (Patient not taking: Reported on 05/02/2021), Disp: 90 tablet, Rfl: 3   cyanocobalamin (,VITAMIN B-12,) 1000 MCG/ML injection, SMARTSIG:1 Milliliter(s) SUB-Q Twice Monthly (Patient not taking: Reported on 05/02/2021), Disp: , Rfl:    lisinopril-hydrochlorothiazide (ZESTORETIC) 10-12.5 MG tablet,  Take 1 tablet by mouth daily. (Patient not taking: Reported on 05/02/2021), Disp: 90 tablet, Rfl: 3   SUMAtriptan (IMITREX) 100 MG tablet, TAKE 1 TABLET ORALLY AS NEEDED (Patient not taking: Reported on 05/02/2021), Disp: 10 tablet, Rfl: 5 Social History   Socioeconomic History   Marital status: Divorced    Spouse name: Not on file   Number of children: 4   Years of education: Not on file   Highest education level: Not on file  Occupational History   Occupation: Disabled    Comment: Fibromyalgia, arthritis  Tobacco Use    Smoking status: Never   Smokeless tobacco: Never  Vaping Use   Vaping Use: Never used  Substance and Sexual Activity   Alcohol use: No    Alcohol/week: 0.0 standard drinks    Comment: minimal use   Drug use: No   Sexual activity: Not on file  Other Topics Concern   Not on file  Social History Narrative   Not on file   Social Determinants of Health   Financial Resource Strain: Not on file  Food Insecurity: Not on file  Transportation Needs: Not on file  Physical Activity: Not on file  Stress: Not on file  Social Connections: Not on file  Intimate Partner Violence: Not on file   Family History  Problem Relation Age of Onset   Depression Mother        suicide   Alcohol abuse Father    Coronary artery disease Father     Objective: Office vital signs reviewed. BP 126/78   Pulse 63   Temp 97.8 F (36.6 C)   Ht _0  (1.753 m)   Wt 200 lb 9.6 oz (91 kg)   LMP 09/16/1994   SpO2 95%   BMI 29.62 kg/m   Physical Examination:  General: Awake, alert, well nourished, No acute distress HEENT: Normal; no exophthalmos.  No goiter Cardio: regular rate and rhythm, S1S2 heard, no murmurs appreciated Pulm: clear to auscultation bilaterally, no wheezes, rhonchi or rales; normal work of breathing on room air MSK: Ambulating independently Neuro: Alert and oriented.  Mentation somewhat slowed Psych: Mood stable, speech normal, affect appropriate  Depression screen [facility] 2/9 05/02/2021 12/01/2020 07/05/2020  Decreased Interest 3 0 0  Down, Depressed, Hopeless 3 1 0  PHQ - 2 Score 6 1 0  Altered sleeping 1 - 0  Tired, decreased energy 3 - 0  Change in appetite 1 - 0  Feeling bad or failure about yourself  0 - 0  Trouble concentrating 3 - 0  Moving slowly or fidgety/restless 3 - 0  Suicidal thoughts 0 - 0  PHQ-9 Score 17 - 0  Difficult doing work/chores - - -  Some recent data might be hidden   GAD 7 : Generalized Anxiety Score 05/02/2021 08/27/2018 12/16/2016 11/07/2016   Nervous, Anxious, on Edge _1 Control/stop worrying 2 1 0 0  Worry too much - different things 2 1 0 0  Trouble relaxing _2 Restless 0 1 0 0  Easily annoyed or irritable 0 1 0 0  Afraid - awful might happen 0 1 0 0  Total GAD 7 Score _3 Anxiety Difficulty Not difficult at all Somewhat difficult - Somewhat difficult     Assessment/ Plan: 55 y.o. female   Adult ADHD - Plan: amphetamine-dextroamphetamine (ADDERALL) 15 MG tablet, amphetamine-dextroamphetamine (ADDERALL) 15 MG tablet, ToxASSURE Select 13 (MW), Urine, Ambulatory referral to Neurology  Good  hypertension control - Plan: CMP14+EGFR  Acquired hypothyroidism - Plan: TSH, T4, free, CMP14+EGFR  Pure hypercholesterolemia - Plan: Lipid panel, CMP14+EGFR  Seizure disorder ([place]) - Plan: Ambulatory referral to Neurology  Multiple sclerosis ([place]) - Plan: Ambulatory referral to Neurology, Ambulatory referral to Urology  Decreased urine output - Plan: Ambulatory referral to Urology  Positive screening for depression on 2-item Patient Health Questionnaire (PHQ-2)  I have agreed to bridge her with 2 months of Adderall while she waits to establish care with her neurologist.  The national narcotic database was reviewed.  She has controlled substance contract in place with her pain specialist and he is aware of this medication being prescribed by neurology.  A drug screen was obtained as per office policy today  Blood pressure well controlled.  Future order for CMP placed.  Okay to stay off of lisinopril entirely since blood pressure is controlled without it  Check thyroid levels along with CMP.  Advised to hold on any biotin containing products for the next 2 weeks prior to acquiring labs  She will come in for fasting lipid panel.  We will make decision again at that time to restart cholesterol medicine.  Plan for Crestor perhaps every other day  Referral to neurology placed for ongoing management of multiple  sclerosis, seizure disorder and ADHD as above  She is had some intermittent decreased urine output.  Given history of multiple sclerosis, would like her to be evaluated by urology to make sure she is having no obstructive processes.  She certainly sounds like she may have passed some urinary stones recently  Additionally I reviewed with her that her PHQ and GAD-7 scores were elevated.  She did not wish to pursue any treatment and felt that this was situational.  No orders of the defined types were placed in this encounter.  No orders of the defined types were placed in this encounter.    Janora Norlander, DO [place] [phone]

## 2021-05-05 LAB — TOXASSURE SELECT 13 (MW), URINE

## 2021-06-04 ENCOUNTER — Encounter: Payer: Self-pay | Admitting: Urology

## 2021-06-04 ENCOUNTER — Ambulatory Visit: Payer: Medicaid Other | Admitting: Urology

## 2021-06-04 ENCOUNTER — Other Ambulatory Visit: Payer: Self-pay

## 2021-06-04 VITALS — BP 134/76 | HR 57

## 2021-06-04 DIAGNOSIS — N3281 Overactive bladder: Secondary | ICD-10-CM

## 2021-06-04 DIAGNOSIS — N3941 Urge incontinence: Secondary | ICD-10-CM | POA: Diagnosis not present

## 2021-06-04 DIAGNOSIS — R34 Anuria and oliguria: Secondary | ICD-10-CM

## 2021-06-04 LAB — URINALYSIS, ROUTINE W REFLEX MICROSCOPIC
Bilirubin, UA: NEGATIVE
Glucose, UA: NEGATIVE
Ketones, UA: NEGATIVE
Nitrite, UA: NEGATIVE
Protein,UA: NEGATIVE
Specific Gravity, UA: >1.03 — ABNORMAL HIGH (ref 1.005–1.030)
Urobilinogen, Ur: 0.2 mg/dL (ref 0.2–1.0)
pH, UA: 5 (ref 5.0–7.5)

## 2021-06-04 LAB — MICROSCOPIC EXAMINATION

## 2021-06-04 LAB — BLADDER SCAN AMB NON-IMAGING: Scan Result: 9

## 2021-06-04 MED ORDER — MIRABEGRON ER 50 MG PO TB24: 50.0000 mg | ORAL_TABLET | Freq: Every day | ORAL | 0 refills | Status: DC

## 2021-06-04 NOTE — Progress Notes (Signed)
Assessment: 1. Overactive bladder   2. Urge incontinence      Plan: Diagnosis and management of overactive bladder and urge incontinence discussed.  Options for management including dietary modification with avoidance of dietary irritants, behavioral therapy, medical therapy, neuromodulation, and chemodenervation reviewed. Trial of Myrbetriq 25 mg x 2 weeks then 50 mg daily x 2 weeks.  Samples given Bladder diet discussed Return to office in 1 month  Chief Complaint:  Chief Complaint  Patient presents with   Urinary Incontinence     History of Present Illness:  Colleen Sellers is a 55 y.o. year old female who is seen in consultation from Delynn Flavin M, DO for evaluation of overactive bladder symptoms. She reports urinary frequency, urgency, nocturia x 3, sensation of incomplete emptying, slow stream, hesitancy, and urge incontinence.  She also has unaware incontinence at night.  Her symptoms have been present for over 6 months with gradual worsening.  No dysuria or gross hematuria.  No SUI.  No recent UTI's.  She voids 1-2 times/hour.  No prior medical therapy.   She has been diagnosed with MS.  History of DJD with herniated disc.   She takes chronic pain medication.  She reports constipation.   Past Medical History:  Past Medical History:  Diagnosis Date   Acid reflux    Anxiety    Arthritis    Asthma    Chest pain    Dyspnea, diaphoresis   CHF (congestive heart failure) (HCC)    Chronic back pain    Degenerative joint disease    Also scoliosis and pectus deformity   Depression    Fibromyalgia    Hyperlipidemia    IBS (irritable bowel syndrome)    Mild hematochezia; negative for celiac disease; mild Schatzki's ring; small hiatal hernia; minor gastric erosions; few diverticula   MS (multiple sclerosis) (HCC)    Overweight(278.02)    Negative sleep study-2010, but treated for narcolepsy   Seizure disorder [facility])    Surgical menopause 09/16/2000   age 69    Past  Surgical History:  Past Surgical History:  Procedure Laterality Date   BRAIN SURGERY     CHOLECYSTECTOMY     COLONOSCOPY  2001, 2010   negative/friable anal canal scattered pancolonic diverticula   ESOPHAGOGASTRODUODENOSCOPY  11/22/08   noncritical Schatzi ring/small hiatal herina/tiny antral reosions   RHINOPLASTY     Deviated septum   SALPINGOOPHORECTOMY  2002   Bilateral   TUBAL LIGATION     VAGINAL HYSTERECTOMY  1996    Allergies:  Allergies  Allergen Reactions   Ceftin Other (See Comments)    Unknown   Codeine Nausea And Vomiting   Morphine Hives and Itching   Adhesive [Tape] Rash   Latex Rash    Family History:  Family History  Problem Relation Age of Onset   Depression Mother        suicide   Alcohol abuse Father    Coronary artery disease Father     Social History:  Social History   Tobacco Use   Smoking status: Never   Smokeless tobacco: Never  Vaping Use   Vaping Use: Never used  Substance Use Topics   Alcohol use: No    Alcohol/week: 0.0 standard drinks    Comment: minimal use   Drug use: No    Review of symptoms:  Constitutional:  Negative for unexplained weight loss, night sweats, fever, chills ENT:  Negative for nose bleeds, sinus pain, painful swallowing CV:  Negative for chest  pain, shortness of breath, exercise intolerance, palpitations, loss of consciousness Resp:  Negative for cough, wheezing, shortness of breath GI:  Negative for nausea, vomiting, diarrhea, bloody stools GU:  Positives noted in HPI; otherwise negative for gross hematuria, dysuria Neuro:  Negative for seizures, poor balance, limb weakness, slurred speech Psych:  Negative for lack of energy, depression, anxiety Endocrine:  Negative for polydipsia, polyuria, symptoms of hypoglycemia (dizziness, hunger, sweating) Hematologic:  Negative for anemia, purpura, petechia, prolonged or excessive bleeding, use of anticoagulants  Allergic:  Negative for difficulty breathing or  choking as a result of exposure to anything; no shellfish allergy; no allergic response (rash/itch) to materials, foods  Physical exam BP 134/76   Pulse (!) 57   LMP 09/16/1994  GENERAL APPEARANCE:  Well appearing, well developed, well nourished, NAD HEENT: Atraumatic, Normocephalic, oropharynx clear. NECK: Supple without lymphadenopathy or thyromegaly. LUNGS: Clear to auscultation bilaterally. HEART: Regular Rate and Rhythm without murmurs, gallops, or rubs. ABDOMEN: Soft, non-tender, No Masses. EXTREMITIES: Moves all extremities well.  Without clubbing, cyanosis, or edema. NEUROLOGIC:  Alert and oriented x 3, normal gait, CN II-XII grossly intact.  MENTAL STATUS:  Appropriate. BACK:  Non-tender to palpation.  No CVAT SKIN:  Warm, dry and intact.    Results: Results for orders placed or performed in visit on 06/04/21 (from the past 24 hour(s))  Microscopic Examination   Collection Time: 06/04/21  3:41 PM   Urine  Result Value Ref Range   WBC, UA 0-5 0 - 5 /hpf   RBC 0-2 0 - 2 /hpf   Epithelial Cells (non renal) 0-10 0 - 10 /hpf   Renal Epithel, UA 0-10 (A) None seen /hpf   Mucus, UA Present Not Estab.   Bacteria, UA Moderate (A) None seen/Few  Urinalysis, Routine w reflex microscopic   Collection Time: 06/04/21  3:41 PM  Result Value Ref Range   Specific Gravity, UA >1.030 (H) 1.005 - 1.030   pH, UA 5.0 5.0 - 7.5   Color, UA Yellow Yellow   Appearance Ur Clear Clear   Leukocytes,UA Trace (A) Negative   Protein,UA Negative Negative/Trace   Glucose, UA Negative Negative   Ketones, UA Negative Negative   RBC, UA Trace (A) Negative   Bilirubin, UA Negative Negative   Urobilinogen, Ur 0.2 0.2 - 1.0 mg/dL   Nitrite, UA Negative Negative   Microscopic Examination See below:   BLADDER SCAN AMB NON-IMAGING   Collection Time: 06/04/21  3:48 PM  Result Value Ref Range   Scan Result 9

## 2021-06-04 NOTE — Progress Notes (Signed)
post void residual=9 Urological Symptom Review  Patient is experiencing the following symptoms: Frequent urination Hard to postpone urination Get up at night to urinate Leakage of urine Stream starts and stops Trouble starting stream Have to strain to urinate Weak stream   Review of Systems  Gastrointestinal (upper)  : Indigestion/heartburn  Gastrointestinal (lower) : Constipation  Constitutional : Fatigue  Skin: Negative for skin symptoms  Eyes: Blurred vision  Ear/Nose/Throat : Sinus problems  Hematologic/Lymphatic: Easy bruising  Cardiovascular : Negative for cardiovascular symptoms  Respiratory : Negative for respiratory symptoms  Endocrine: Negative for endocrine symptoms  Musculoskeletal: Back pain Joint pain  Neurological: Headaches  Psychologic: Depression Anxiety

## 2021-06-29 ENCOUNTER — Encounter: Payer: Self-pay | Admitting: Family Medicine

## 2021-06-29 ENCOUNTER — Ambulatory Visit (INDEPENDENT_AMBULATORY_CARE_PROVIDER_SITE_OTHER): Payer: Medicaid Other | Admitting: Family Medicine

## 2021-06-29 DIAGNOSIS — U071 COVID-19: Secondary | ICD-10-CM | POA: Diagnosis not present

## 2021-06-29 MED ORDER — MOLNUPIRAVIR EUA 200MG CAPSULE: 4.0000 | ORAL_CAPSULE | Freq: Two times a day (BID) | ORAL | 0 refills | Status: AC

## 2021-06-29 NOTE — Progress Notes (Signed)
Virtual Visit via telephone Note Due to COVID-19 pandemic this visit was conducted virtually. This visit type was conducted due to national recommendations for restrictions regarding the COVID-19 Pandemic (e.g. social distancing, sheltering in place) in an effort to limit this patient's exposure and mitigate transmission in our community. All issues noted in this document were discussed and addressed.  A physical exam was not performed with this format.   I connected with Colleen Sellers on 06/29/2021 at 1500 by telephone and verified that I am speaking with the correct person using two identifiers. Colleen Sellers is currently located at home and  home  is currently with them during visit. The provider, Kari Baars, FNP is located in their office at time of visit.  I discussed the limitations, risks, security and privacy concerns of performing an evaluation and management service by telephone and the availability of in person appointments. I also discussed with the patient that there may be a patient responsible charge related to this service. The patient expressed understanding and agreed to proceed.  Subjective:  Patient ID: Colleen Sellers, female    DOB: [birth year], 55 y.o.   MRN: [id number]  Chief Complaint:  Covid Positive   HPI: Colleen Sellers is a 55 y.o. female presenting on 06/29/2021 for Covid Positive   Pt reports she tested positive for COVID-19 yesterday. She reports headache, fever, chills, cough, congestion, and fatigue. She has been taking tylenol without relief of symptoms. She denies productive cough or shortness of breath.     Relevant past medical, surgical, family, and social history reviewed and updated as indicated.  Allergies and medications reviewed and updated.   Past Medical History:  Diagnosis Date   Acid reflux    Anxiety    Arthritis    Asthma    Chest pain    Dyspnea, diaphoresis   CHF (congestive heart failure) (HCC)    Chronic back pain     Degenerative joint disease    Also scoliosis and pectus deformity   Depression    Fibromyalgia    Hyperlipidemia    IBS (irritable bowel syndrome)    Mild hematochezia; negative for celiac disease; mild Schatzki's ring; small hiatal hernia; minor gastric erosions; few diverticula   MS (multiple sclerosis) (HCC)    Overweight(278.02)    Negative sleep study-2010, but treated for narcolepsy   Seizure disorder [facility])    Surgical menopause 09/16/2000   age 40    Past Surgical History:  Procedure Laterality Date   BRAIN SURGERY     CHOLECYSTECTOMY     COLONOSCOPY  2001, 2010   negative/friable anal canal scattered pancolonic diverticula   ESOPHAGOGASTRODUODENOSCOPY  11/22/08   noncritical Schatzi ring/small hiatal herina/tiny antral reosions   RHINOPLASTY     Deviated septum   SALPINGOOPHORECTOMY  2002   Bilateral   TUBAL LIGATION     VAGINAL HYSTERECTOMY  1996    Social History   Socioeconomic History   Marital status: Divorced    Spouse name: Not on file   Number of children: 4   Years of education: Not on file   Highest education level: Not on file  Occupational History   Occupation: Disabled    Comment: Fibromyalgia, arthritis  Tobacco Use   Smoking status: Never   Smokeless tobacco: Never  Vaping Use   Vaping Use: Never used  Substance and Sexual Activity   Alcohol use: No    Alcohol/week: 0.0 standard drinks    Comment: minimal use  Drug use: No   Sexual activity: Not on file  Other Topics Concern   Not on file  Social History Narrative   Not on file   Social Determinants of Health   Financial Resource Strain: Not on file  Food Insecurity: Not on file  Transportation Needs: Not on file  Physical Activity: Not on file  Stress: Not on file  Social Connections: Not on file  Intimate Partner Violence: Not on file    Outpatient Encounter Medications as of 06/29/2021  Medication Sig   molnupiravir EUA (LAGEVRIO) 200 mg CAPS capsule Take 4 capsules (800  mg total) by mouth 2 (two) times daily for 5 days.   amphetamine-dextroamphetamine (ADDERALL) 15 MG tablet Take 1 tablet by mouth 2 (two) times daily.   conjugated estrogens (PREMARIN) vaginal cream Apply 1/2 gram vaginally 3 days per week.  May reduce to twice per week after 4 weeks.   cyanocobalamin (,VITAMIN B-12,) 1000 MCG/ML injection    levothyroxine (SYNTHROID) 75 MCG tablet TAKE 1 TABLET BY MOUTH EVERY DAY   mirabegron ER (MYRBETRIQ) 50 MG TB24 tablet Take 1 tablet (50 mg total) by mouth daily.   oxyCODONE (OXY IR/ROXICODONE) 5 MG immediate release tablet Take 5 mg by mouth every 4 (four) hours as needed for severe pain.   oxyCODONE ER (XTAMPZA ER) 9 MG C12A Take by mouth.   oxyCODONE-acetaminophen (PERCOCET) 7.5-325 MG tablet Take 1 tablet by mouth 2 (two) times daily as needed.   No facility-administered encounter medications on file as of 06/29/2021.    Allergies  Allergen Reactions   Ceftin Other (See Comments)    Unknown   Codeine Nausea And Vomiting   Morphine Hives and Itching   Adhesive [Tape] Rash   Latex Rash    Review of Systems  Constitutional:  Positive for activity change, appetite change, fatigue and fever. Negative for chills, diaphoresis and unexpected weight change.  HENT:  Positive for congestion, postnasal drip, rhinorrhea and sore throat. Negative for sinus pressure, sinus pain, sneezing, tinnitus, trouble swallowing and voice change.   Eyes: Negative.   Respiratory:  Positive for cough. Negative for apnea, chest tightness, shortness of breath, wheezing and stridor.   Cardiovascular:  Negative for chest pain, palpitations and leg swelling.  Gastrointestinal:  Negative for abdominal pain, blood in stool, constipation, diarrhea, nausea and vomiting.  Endocrine: Negative.   Genitourinary:  Negative for decreased urine volume, difficulty urinating, dysuria, frequency and urgency.  Musculoskeletal:  Positive for myalgias. Negative for arthralgias.  Skin:  Negative.   Allergic/Immunologic: Negative.   Neurological:  Positive for headaches. Negative for dizziness, tremors, seizures, syncope, facial asymmetry, speech difficulty, weakness, light-headedness and numbness.  Hematological: Negative.   Psychiatric/Behavioral:  Negative for confusion, hallucinations, sleep disturbance and suicidal ideas.   All other systems reviewed and are negative.       Observations/Objective: No vital signs or physical exam, this was a telephone or virtual health encounter.  Pt alert and oriented, answers all questions appropriately, and able to speak in full sentences.    Assessment and Plan: Jayme was seen today for covid positive.  Diagnoses and all orders for this visit:  COVID-19 virus infection COVID-19 positive. Due to age and comorbities, will initiate antiviral therapy. Symptomatic care discussed in detail. Mucinex with plenty of water, delsym for cough, and tylenol for fever and pain control. Report any new, worsening, or persistent symptoms.  -     molnupiravir EUA (LAGEVRIO) 200 mg CAPS capsule; Take 4 capsules (800 mg total)  by mouth 2 (two) times daily for 5 days.     Follow Up Instructions: Return if symptoms worsen or fail to improve.    I discussed the assessment and treatment plan with the patient. The patient was provided an opportunity to ask questions and all were answered. The patient agreed with the plan and demonstrated an understanding of the instructions.   The patient was advised to call back or seek an in-person evaluation if the symptoms worsen or if the condition fails to improve as anticipated.  The above assessment and management plan was discussed with the patient. The patient verbalized understanding of and has agreed to the management plan. Patient is aware to call the clinic if they develop any new symptoms or if symptoms persist or worsen. Patient is aware when to return to the clinic for a follow-up visit. Patient  educated on when it is appropriate to go to the emergency department.    I provided 12 minutes of non-face-to-face time during this encounter. The call started at 1500. The call ended at 1511. The other time was used for coordination of care.    Kari Baars, FNP-C [facility] [address] [place], [place] [address] [phone] 06/29/2021

## 2021-07-05 ENCOUNTER — Ambulatory Visit: Payer: Medicaid Other | Admitting: Urology

## 2021-07-05 DIAGNOSIS — N3941 Urge incontinence: Secondary | ICD-10-CM

## 2021-07-05 DIAGNOSIS — N3281 Overactive bladder: Secondary | ICD-10-CM

## 2021-07-11 ENCOUNTER — Ambulatory Visit: Payer: Medicaid Other | Admitting: Urology

## 2021-07-11 NOTE — Progress Notes (Deleted)
Assessment: 1. Overactive bladder   2. Urge incontinence     Plan: ***  Chief Complaint: No chief complaint on file.   HPI: Colleen Sellers is a 55 y.o. female who presents for continued evaluation of overactive bladder symptoms. At her initial visit in 9/22, she reported urinary frequency, urgency, nocturia x 3, sensation of incomplete emptying, slow stream, hesitancy, and urge incontinence.  She also had unaware incontinence at night.  Her symptoms have been present for over 6 months with gradual worsening.  No dysuria or gross hematuria.  No SUI.  No recent UTI's.  She voids 1-2 times/hour.  No prior medical therapy.   She has been diagnosed with MS.  History of DJD with herniated disc.   She takes chronic pain medication.  She reports constipation.  She was given a trial of Myrbetriq 50 mg at her visit in 9/22.   Portions of the above documentation were copied from a prior visit for review purposes only.  Allergies: Allergies  Allergen Reactions   Ceftin Other (See Comments)    Unknown   Codeine Nausea And Vomiting   Morphine Hives and Itching   Adhesive [Tape] Rash   Latex Rash    PMH: Past Medical History:  Diagnosis Date   Acid reflux    Anxiety    Arthritis    Asthma    Chest pain    Dyspnea, diaphoresis   CHF (congestive heart failure) (HCC)    Chronic back pain    Degenerative joint disease    Also scoliosis and pectus deformity   Depression    Fibromyalgia    Hyperlipidemia    IBS (irritable bowel syndrome)    Mild hematochezia; negative for celiac disease; mild Schatzki's ring; small hiatal hernia; minor gastric erosions; few diverticula   MS (multiple sclerosis) (HCC)    Overweight(278.02)    Negative sleep study-2010, but treated for narcolepsy   Seizure disorder [facility])    Surgical menopause 09/16/2000   age 26    PSH: Past Surgical History:  Procedure Laterality Date   BRAIN SURGERY     CHOLECYSTECTOMY     COLONOSCOPY  2001, 2010    negative/friable anal canal scattered pancolonic diverticula   ESOPHAGOGASTRODUODENOSCOPY  11/22/08   noncritical Schatzi ring/small hiatal herina/tiny antral reosions   RHINOPLASTY     Deviated septum   SALPINGOOPHORECTOMY  2002   Bilateral   TUBAL LIGATION     VAGINAL HYSTERECTOMY  1996    SH: Social History   Tobacco Use   Smoking status: Never   Smokeless tobacco: Never  Vaping Use   Vaping Use: Never used  Substance Use Topics   Alcohol use: No    Alcohol/week: 0.0 standard drinks    Comment: minimal use   Drug use: No    ROS: Constitutional:  Negative for fever, chills, weight loss CV: Negative for chest pain, previous MI, hypertension Respiratory:  Negative for shortness of breath, wheezing, sleep apnea, frequent cough GI:  Negative for nausea, vomiting, bloody stool, GERD  PE: LMP 09/16/1994  GENERAL APPEARANCE:  Well appearing, well developed, well nourished, NAD HEENT:  Atraumatic, normocephalic, oropharynx clear NECK:  Supple without lymphadenopathy or thyromegaly ABDOMEN:  Soft, non-tender, no masses EXTREMITIES:  Moves all extremities well, without clubbing, cyanosis, or edema NEUROLOGIC:  Alert and oriented x 3, normal gait, CN II-XII grossly intact MENTAL STATUS:  appropriate BACK:  Non-tender to palpation, No CVAT SKIN:  Warm, dry, and intact   Results: No results  found for this or any previous visit (from the past 24 hour(s)).

## 2021-07-25 ENCOUNTER — Ambulatory Visit: Payer: Medicaid Other | Admitting: Urology

## 2021-07-25 ENCOUNTER — Encounter: Payer: Self-pay | Admitting: Urology

## 2021-07-25 ENCOUNTER — Other Ambulatory Visit: Payer: Self-pay

## 2021-07-25 VITALS — BP 137/85 | HR 79

## 2021-07-25 DIAGNOSIS — N3941 Urge incontinence: Secondary | ICD-10-CM

## 2021-07-25 DIAGNOSIS — R8290 Unspecified abnormal findings in urine: Secondary | ICD-10-CM

## 2021-07-25 DIAGNOSIS — N3281 Overactive bladder: Secondary | ICD-10-CM

## 2021-07-25 LAB — URINALYSIS, ROUTINE W REFLEX MICROSCOPIC
Bilirubin, UA: NEGATIVE
Glucose, UA: NEGATIVE
Ketones, UA: NEGATIVE
Nitrite, UA: NEGATIVE
Protein,UA: NEGATIVE
Specific Gravity, UA: >1.03 — ABNORMAL HIGH (ref 1.005–1.030)
Urobilinogen, Ur: 0.2 mg/dL (ref 0.2–1.0)
pH, UA: 5 (ref 5.0–7.5)

## 2021-07-25 LAB — MICROSCOPIC EXAMINATION
Epithelial Cells (non renal): >10 /hpf — AB (ref 0–10)
Renal Epithel, UA: NONE SEEN /hpf

## 2021-07-25 MED ORDER — MIRABEGRON ER 50 MG PO TB24: 50.0000 mg | ORAL_TABLET | Freq: Every day | ORAL | 11 refills | Status: DC

## 2021-07-25 NOTE — Progress Notes (Signed)
Assessment: 1. Overactive bladder   2. Urge incontinence   3. Abnormal urine findings     Plan: Urine culture sent today Resume Myrbetriq 50 mg daily. Rx sent. Return to office in 3 months  Chief Complaint: Chief Complaint  Patient presents with   Urinary Incontinence     HPI: Colleen Sellers is a 55 y.o. female who presents for continued evaluation of overactive bladder symptoms.  At her initial visit in 9/22, she reported urinary frequency, urgency, nocturia x 3, sensation of incomplete emptying, slow stream, hesitancy, and urge incontinence.  She also had unaware incontinence at night.  Her symptoms had been present for over 6 months with gradual worsening.  No dysuria or gross hematuria.  No SUI.  No recent UTI's.  She was voiding 1-2 times/hour.  No prior medical therapy.   She has been diagnosed with MS.  History of DJD with herniated disc.   She takes chronic pain medication.  She has a history of constipation.  She was given a trial of Myrbetriq 50 mg daily.  She reports significant improvement in her urinary symptoms with the medication.  No side effects.  Her symptoms have returned since running out of the samples approximately 2 weeks ago.  No current dysuria or gross hematuria.   Portions of the above documentation were copied from a prior visit for review purposes only.  Allergies: Allergies  Allergen Reactions   Ceftin Other (See Comments)    Unknown   Codeine Nausea And Vomiting   Morphine Hives and Itching   Adhesive [Tape] Rash   Latex Rash    PMH: Past Medical History:  Diagnosis Date   Acid reflux    Anxiety    Arthritis    Asthma    Chest pain    Dyspnea, diaphoresis   CHF (congestive heart failure) (HCC)    Chronic back pain    Degenerative joint disease    Also scoliosis and pectus deformity   Depression    Fibromyalgia    Hyperlipidemia    IBS (irritable bowel syndrome)    Mild hematochezia; negative for celiac disease; mild Schatzki's  ring; small hiatal hernia; minor gastric erosions; few diverticula   MS (multiple sclerosis) (HCC)    Overweight(278.02)    Negative sleep study-2010, but treated for narcolepsy   Seizure disorder [facility])    Surgical menopause 09/16/2000   age 45    PSH: Past Surgical History:  Procedure Laterality Date   BRAIN SURGERY     CHOLECYSTECTOMY     COLONOSCOPY  2001, 2010   negative/friable anal canal scattered pancolonic diverticula   ESOPHAGOGASTRODUODENOSCOPY  11/22/08   noncritical Schatzi ring/small hiatal herina/tiny antral reosions   RHINOPLASTY     Deviated septum   SALPINGOOPHORECTOMY  2002   Bilateral   TUBAL LIGATION     VAGINAL HYSTERECTOMY  1996    SH: Social History   Tobacco Use   Smoking status: Never   Smokeless tobacco: Never  Vaping Use   Vaping Use: Never used  Substance Use Topics   Alcohol use: No    Alcohol/week: 0.0 standard drinks    Comment: minimal use   Drug use: No    ROS: Constitutional:  Negative for fever, chills, weight loss CV: Negative for chest pain, previous MI, hypertension Respiratory:  Negative for shortness of breath, wheezing, sleep apnea, frequent cough GI:  Negative for nausea, vomiting, bloody stool, GERD  PE: BP 137/85   Pulse 79   LMP 09/16/1994  GENERAL APPEARANCE:  Well appearing, well developed, well nourished, NAD HEENT:  Atraumatic, normocephalic, oropharynx clear NECK:  Supple without lymphadenopathy or thyromegaly ABDOMEN:  Soft, non-tender, no masses EXTREMITIES:  Moves all extremities well, without clubbing, cyanosis, or edema NEUROLOGIC:  Alert and oriented x 3, normal gait, CN II-XII grossly intact MENTAL STATUS:  appropriate BACK:  Non-tender to palpation, No CVAT SKIN:  Warm, dry, and intact   Results: U/A: 11/30 WBCs, 3-10 RBCs, >10 epithelial cells, many bacteria

## 2021-07-25 NOTE — Addendum Note (Signed)
Addended by: Gustavus Messing on: 07/25/2021 05:03 PM   Modules accepted: Orders

## 2021-07-25 NOTE — Progress Notes (Signed)
Urological Symptom Review  Patient is experiencing the following symptoms: Frequent urination Hard to postpone urination Get up at night to urinate Leakage of urine Stream starts and stops Trouble starting stream Have to strain to urinate   Review of Systems  Gastrointestinal (upper)  : Negative for upper GI symptoms  Gastrointestinal (lower) : Constipation  Constitutional : Night Sweats Fatigue  Skin: Negative for skin symptoms  Eyes: Blurred vision Double vision  Ear/Nose/Throat : Sinus problems  Hematologic/Lymphatic: Negative for Hematologic/Lymphatic symptoms  Cardiovascular : Leg swelling  Respiratory : Shortness of breath  Endocrine: Negative for endocrine symptoms  Musculoskeletal: Back pain Joint pain  Neurological: Headaches  Psychologic: Depression Anxiety

## 2021-07-27 LAB — URINE CULTURE

## 2021-08-21 ENCOUNTER — Telehealth: Payer: Self-pay

## 2021-08-21 NOTE — Telephone Encounter (Signed)
PA started on covermymeds for myrbetriq

## 2021-08-29 ENCOUNTER — Telehealth: Payer: Self-pay | Admitting: Family Medicine

## 2021-08-29 DIAGNOSIS — F909 Attention-deficit hyperactivity disorder, unspecified type: Secondary | ICD-10-CM

## 2021-08-30 NOTE — Telephone Encounter (Signed)
Pt ran out two days ago of her amphetamine-dextroamphetamine (ADDERALL) 15 MG tablet. Her pcp is Nadine Counts, please call back to schedule apt.

## 2021-08-31 NOTE — Telephone Encounter (Signed)
Patient calling back to speak with Alyssa.

## 2021-08-31 NOTE — Telephone Encounter (Signed)
If we have a cancellation or no show, we can add her on today but otherwise will likely have to wait since is a C2 prescription.  When is she supposed to see her neurologist?

## 2021-08-31 NOTE — Telephone Encounter (Signed)
lmtcb

## 2021-08-31 NOTE — Telephone Encounter (Signed)
Pt aware we will call to see her today if we have a no show or cancellation but scheduled her for 12/30

## 2021-09-14 ENCOUNTER — Telehealth (INDEPENDENT_AMBULATORY_CARE_PROVIDER_SITE_OTHER): Payer: Medicaid Other | Admitting: Family Medicine

## 2021-09-14 ENCOUNTER — Encounter: Payer: Self-pay | Admitting: Family Medicine

## 2021-09-14 DIAGNOSIS — F909 Attention-deficit hyperactivity disorder, unspecified type: Secondary | ICD-10-CM

## 2021-09-14 DIAGNOSIS — G35 Multiple sclerosis: Secondary | ICD-10-CM | POA: Diagnosis not present

## 2021-09-14 DIAGNOSIS — G40909 Epilepsy, unspecified, not intractable, without status epilepticus: Secondary | ICD-10-CM

## 2021-09-14 MED ORDER — AMPHETAMINE-DEXTROAMPHETAMINE 15 MG PO TABS: 1.0000 | ORAL_TABLET | Freq: Two times a day (BID) | ORAL | 0 refills | Status: DC

## 2021-09-14 NOTE — Progress Notes (Signed)
MyChart Video visit  Subjective: LK:[id number],XTAVW PCP: Raliegh Ip, DO PVX:[id number] D [place] is a 55 y.o. female. Patient provides verbal consent for consult held via video.  Due to COVID-19 pandemic this visit was conducted virtually. This visit type was conducted due to national recommendations for restrictions regarding the COVID-19 Pandemic (e.g. social distancing, sheltering in place) in an effort to limit this patient's exposure and mitigate transmission in our community. All issues noted in this document were discussed and addressed.  A physical exam was not performed with this format.   Location of patient: home Location of provider: [facility] Others present for call: none  1.  ADHD, MS Patient unfortunately did not get to see Dr. Gerilyn Pilgrim despite having had an appointment scheduled.  She is not contracting COVID and had to cancel it and has yet to reschedule.  She did run out of her ADHD medication and is asking that we renew that for her today.  She continues to follow-up with pain management for her MS.  She denies any excessive daytime sedation, difficulty sleeping, excessive dryness or constipation.   ROS: Per HPI  Allergies  Allergen Reactions   Ceftin Other (See Comments)    Unknown   Codeine Nausea And Vomiting   Morphine Hives and Itching   Adhesive [Tape] Rash   Latex Rash   Past Medical History:  Diagnosis Date   Acid reflux    Anxiety    Arthritis    Asthma    Chest pain    Dyspnea, diaphoresis   CHF (congestive heart failure) (HCC)    Chronic back pain    Degenerative joint disease    Also scoliosis and pectus deformity   Depression    Fibromyalgia    Hyperlipidemia    IBS (irritable bowel syndrome)    Mild hematochezia; negative for celiac disease; mild Schatzki's ring; small hiatal hernia; minor gastric erosions; few diverticula   MS (multiple sclerosis) (HCC)    Overweight(278.02)    Negative sleep study-2010, but treated for narcolepsy   Seizure  disorder (HCC)    Surgical menopause 09/16/2000   age 37    Current Outpatient Medications:    amphetamine-dextroamphetamine (ADDERALL) 15 MG tablet, Take 1 tablet by mouth 2 (two) times daily., Disp: 60 tablet, Rfl: 0   conjugated estrogens (PREMARIN) vaginal cream, Apply 1/2 gram vaginally 3 days per week.  May reduce to twice per week after 4 weeks., Disp: 42.5 g, Rfl: 12   cyanocobalamin (,VITAMIN B-12,) 1000 MCG/ML injection, , Disp: , Rfl:    levothyroxine (SYNTHROID) 75 MCG tablet, TAKE 1 TABLET BY MOUTH EVERY DAY, Disp: 90 tablet, Rfl: 3   mirabegron ER (MYRBETRIQ) 50 MG TB24 tablet, Take 1 tablet (50 mg total) by mouth daily., Disp: 30 tablet, Rfl: 11   oxyCODONE ER (XTAMPZA ER) 9 MG C12A, Take by mouth., Disp: , Rfl:    oxyCODONE-acetaminophen (PERCOCET) 7.5-325 MG tablet, Take 1 tablet by mouth 2 (two) times daily as needed., Disp: , Rfl:   Assessment/ Plan: 55 y.o. female   Adult ADHD - Plan: amphetamine-dextroamphetamine (ADDERALL) 15 MG tablet, Ambulatory referral to Neurology  Seizure disorder [facility]) - Plan: Ambulatory referral to Neurology  Multiple sclerosis (HCC) - Plan: Ambulatory referral to Neurology  Adderall renewed.  New referral placed to neurology.  Hopefully they can assist with history of seizure disorder and MS as well.  She knows that she needs a scheduled appointment with them for ongoing refills of this medication.  The Narcotic Database has  been reviewed.  There were no red flags.     Start time: 11:41a End time: 11:46a  Total time spent on patient care (including video visit/ documentation): 5 minutes  Earnestene Angello Hulen Skains, DO [facility] [phone]

## 2021-10-16 DIAGNOSIS — Z79891 Long term (current) use of opiate analgesic: Secondary | ICD-10-CM | POA: Diagnosis not present

## 2021-10-16 DIAGNOSIS — M533 Sacrococcygeal disorders, not elsewhere classified: Secondary | ICD-10-CM | POA: Diagnosis not present

## 2021-10-16 DIAGNOSIS — M5136 Other intervertebral disc degeneration, lumbar region: Secondary | ICD-10-CM | POA: Diagnosis not present

## 2021-10-16 DIAGNOSIS — G894 Chronic pain syndrome: Secondary | ICD-10-CM | POA: Diagnosis not present

## 2021-10-16 DIAGNOSIS — M47816 Spondylosis without myelopathy or radiculopathy, lumbar region: Secondary | ICD-10-CM | POA: Diagnosis not present

## 2022-01-15 DIAGNOSIS — M47816 Spondylosis without myelopathy or radiculopathy, lumbar region: Secondary | ICD-10-CM | POA: Diagnosis not present

## 2022-01-15 DIAGNOSIS — G894 Chronic pain syndrome: Secondary | ICD-10-CM | POA: Diagnosis not present

## 2022-01-15 DIAGNOSIS — M533 Sacrococcygeal disorders, not elsewhere classified: Secondary | ICD-10-CM | POA: Diagnosis not present

## 2022-01-15 DIAGNOSIS — M797 Fibromyalgia: Secondary | ICD-10-CM | POA: Diagnosis not present

## 2022-01-15 DIAGNOSIS — M5136 Other intervertebral disc degeneration, lumbar region: Secondary | ICD-10-CM | POA: Diagnosis not present

## 2022-01-22 ENCOUNTER — Encounter: Payer: Self-pay | Admitting: Family Medicine

## 2022-01-22 ENCOUNTER — Ambulatory Visit: Payer: Medicaid Other | Admitting: Family Medicine

## 2022-01-22 VITALS — BP 140/86 | HR 66 | Temp 97.4°F | Ht 69.0 in | Wt 204.8 lb

## 2022-01-22 DIAGNOSIS — I10 Essential (primary) hypertension: Secondary | ICD-10-CM | POA: Diagnosis not present

## 2022-01-22 MED ORDER — LOSARTAN POTASSIUM 50 MG PO TABS: 50.0000 mg | ORAL_TABLET | Freq: Every day | ORAL | 1 refills | Status: DC

## 2022-01-22 NOTE — Progress Notes (Signed)
? ?Assessment & Plan:  ?1. Essential hypertension ?Uncontrolled.  Started on Losartan 50 mg once daily.  Education provided on the DASH diet.  Encouraged patient to continue monitoring her blood pressure and keep a log to bring with her to her next appointment.  Also recommended bringing her home blood pressure cuff so that it can be compared to ours for accuracy. ?- losartan (COZAAR) 50 MG tablet; Take 1 tablet (50 mg total) by mouth daily.  Dispense: 30 tablet; Refill: 1 ? ? ?Follow up plan: Return in about 3 weeks (around 02/12/2022) for HTN with PCP. ? ?Deliah Boston, MSN, APRN, FNP-C ?[facility] ? ?Subjective:  ? ?Patient ID: ADELL KOVAL, female    DOB: 1966-09-04, 56 y.o.   MRN: [id number] ? ?HPI: ?KAITLYND PHILLIPS is a 56 y.o. female presenting on 01/22/2022 for Hypertension (Yesterday 200/113) ? ?Patient is here with concerns regarding her blood pressure. She states yesterday she was 200/113.  She does routinely check her blood pressure at home and reports it is generally 160s/80s.  Yesterday she was experiencing a headache and blurred vision when her blood pressure was so elevated.  She reports it was also elevated at her pain management doctor's office recently (174/87).  She took lisinopril in the past which she reports made her feel sluggish. ? ? ?ROS: Negative unless specifically indicated above in HPI.  ? ?Relevant past medical history reviewed and updated as indicated.  ? ?Allergies and medications reviewed and updated. ? ? ?Current Outpatient Medications:  ?  amphetamine-dextroamphetamine (ADDERALL) 15 MG tablet, Take 1 tablet by mouth 2 (two) times daily., Disp: 60 tablet, Rfl: 0 ?  conjugated estrogens (PREMARIN) vaginal cream, Apply 1/2 gram vaginally 3 days per week.  May reduce to twice per week after 4 weeks., Disp: 42.5 g, Rfl: 12 ?  gabapentin (NEURONTIN) 300 MG capsule, Take 1 capsule by mouth in the morning, at noon, and at bedtime., Disp: , Rfl:  ?  levothyroxine  (SYNTHROID) 75 MCG tablet, TAKE 1 TABLET BY MOUTH EVERY DAY, Disp: 90 tablet, Rfl: 3 ?  meloxicam (MOBIC) 7.5 MG tablet, Take by mouth., Disp: , Rfl:  ?  oxyCODONE ER (XTAMPZA ER) 9 MG C12A, Take by mouth., Disp: , Rfl:  ?  oxyCODONE-acetaminophen (PERCOCET) 7.5-325 MG tablet, Take 1 tablet by mouth 2 (two) times daily as needed., Disp: , Rfl:  ?  cyanocobalamin (,VITAMIN B-12,) 1000 MCG/ML injection, , Disp: , Rfl:  ?  mirabegron ER (MYRBETRIQ) 50 MG TB24 tablet, Take 1 tablet (50 mg total) by mouth daily. (Patient not taking: Reported on 01/22/2022), Disp: 30 tablet, Rfl: 11 ? ?Allergies  ?Allergen Reactions  ? Ceftin Other (See Comments)  ?  Unknown  ? Codeine Nausea And Vomiting  ? Morphine Hives and Itching  ? Adhesive [Tape] Rash  ? Latex Rash  ? ? ?Objective:  ? ?BP 140/86   Pulse 66   Temp (!) 97.4 ?F (36.3 ?C) (Temporal)   Ht 5\' 9"  (1.753 m)   Wt 204 lb 12.8 oz (92.9 kg)   LMP 09/16/1994   SpO2 94%   BMI 30.24 kg/m?   ? ?Physical Exam ?Vitals reviewed.  ?Constitutional:   ?   General: She is not in acute distress. ?   Appearance: Normal appearance. She is not ill-appearing, toxic-appearing or diaphoretic.  ?HENT:  ?   Head: Normocephalic and atraumatic.  ?Eyes:  ?   General: No scleral icterus.    ?   Right eye: No  discharge.     ?   Left eye: No discharge.  ?   Conjunctiva/sclera: Conjunctivae normal.  ?Cardiovascular:  ?   Rate and Rhythm: Normal rate and regular rhythm.  ?   Heart sounds: Normal heart sounds. No murmur heard. ?  No friction rub. No gallop.  ?Pulmonary:  ?   Effort: Pulmonary effort is normal. No respiratory distress.  ?   Breath sounds: Normal breath sounds. No stridor. No wheezing, rhonchi or rales.  ?Musculoskeletal:     ?   General: Normal range of motion.  ?   Cervical back: Normal range of motion.  ?Skin: ?   General: Skin is warm and dry.  ?   Capillary Refill: Capillary refill takes less than 2 seconds.  ?Neurological:  ?   General: No focal deficit present.  ?   Mental  Status: She is alert and oriented to person, place, and time. Mental status is at baseline.  ?Psychiatric:     ?   Mood and Affect: Mood normal.     ?   Behavior: Behavior normal.     ?   Thought Content: Thought content normal.     ?   Judgment: Judgment normal.  ? ? ? ? ? ? ?

## 2022-02-12 DIAGNOSIS — Z79899 Other long term (current) drug therapy: Secondary | ICD-10-CM | POA: Diagnosis not present

## 2022-02-12 DIAGNOSIS — G4712 Idiopathic hypersomnia without long sleep time: Secondary | ICD-10-CM | POA: Diagnosis not present

## 2022-02-12 DIAGNOSIS — G9332 Myalgic encephalomyelitis/chronic fatigue syndrome: Secondary | ICD-10-CM | POA: Diagnosis not present

## 2022-02-12 DIAGNOSIS — I10 Essential (primary) hypertension: Secondary | ICD-10-CM | POA: Diagnosis not present

## 2022-02-13 ENCOUNTER — Other Ambulatory Visit: Payer: Self-pay

## 2022-02-13 ENCOUNTER — Encounter: Payer: Self-pay | Admitting: Family Medicine

## 2022-02-13 ENCOUNTER — Ambulatory Visit: Payer: Medicaid Other | Admitting: Family Medicine

## 2022-02-13 ENCOUNTER — Other Ambulatory Visit: Payer: Self-pay | Admitting: Family Medicine

## 2022-02-13 VITALS — BP 120/73 | HR 59 | Temp 97.7°F | Resp 20 | Ht 69.0 in | Wt 201.0 lb

## 2022-02-13 DIAGNOSIS — E663 Overweight: Secondary | ICD-10-CM

## 2022-02-13 DIAGNOSIS — J301 Allergic rhinitis due to pollen: Secondary | ICD-10-CM

## 2022-02-13 DIAGNOSIS — I10 Essential (primary) hypertension: Secondary | ICD-10-CM

## 2022-02-13 DIAGNOSIS — E039 Hypothyroidism, unspecified: Secondary | ICD-10-CM | POA: Diagnosis not present

## 2022-02-13 DIAGNOSIS — E7800 Pure hypercholesterolemia, unspecified: Secondary | ICD-10-CM

## 2022-02-13 MED ORDER — LEVOCETIRIZINE DIHYDROCHLORIDE 5 MG PO TABS: 5.0000 mg | ORAL_TABLET | Freq: Every evening | ORAL | 3 refills | Status: DC

## 2022-02-13 MED ORDER — FLUTICASONE PROPIONATE 50 MCG/ACT NA SUSP: 2.0000 | Freq: Every day | NASAL | 6 refills | Status: DC

## 2022-02-13 MED ORDER — LOSARTAN POTASSIUM 50 MG PO TABS: 50.0000 mg | ORAL_TABLET | Freq: Every day | ORAL | 3 refills | Status: DC

## 2022-02-13 NOTE — Patient Instructions (Signed)
You had labs performed today.  You will be contacted with the results of the labs once they are available, usually in the next 3 business days for routine lab work.  If you have an active my chart account, they will be released to your MyChart.  If you prefer to have these labs released to you via telephone, please let us know.     

## 2022-02-13 NOTE — Progress Notes (Signed)
Subjective: CC: Follow-up hypertension, hyperlipidemia, hypothyroidism PCP: Janora Norlander, DO TSV:[id number] Colleen Sellers is a 56 y.o. female presenting to clinic today for:  1.  Hypertension with hyperlipidemia associated with overweight BMI Patient recently initiated on losartan 50 mg daily due to uncontrolled blood pressure.  She is tolerating this medication without difficulty.  Reports some swelling of the lower extremities but this seems to be intermittent and associated with standing for prolonged amounts of time.  She is not treated with any cholesterol medication.  She would like her sugar checked today.  She ate a cookie today so is not fasting.  She was told that there may be some type of impairment in the kidney function by her pain specialist and was advised to use the meloxicam sparingly until she saw me for laboratory checkup.  She reports stable urine output.  2.  Allergies Patient reports seasonal allergies.  She previously responded well to Xyzal and Flonase and would like prescriptions for both of these sent in  3.  Hypothyroidism Patient has been out of her Synthroid for over a month now.  She was lost to follow-up due to her working third shift and not being able to come into the clinic.  Her last in office evaluation was August 2022 at which time she did not have her labs done.  Last laboratory checkup for thyroid was back in August 2021.   ROS: Per HPI  Allergies  Allergen Reactions   Ceftin Other (See Comments)    Unknown   Codeine Nausea And Vomiting   Morphine Hives and Itching   Adhesive [Tape] Rash   Latex Rash   Past Medical History:  Diagnosis Date   Acid reflux    Anxiety    Arthritis    Asthma    Chest pain    Dyspnea, diaphoresis   CHF (congestive heart failure) (HCC)    Chronic back pain    Degenerative joint disease    Also scoliosis and pectus deformity   Depression    Fibromyalgia    Hyperlipidemia    IBS (irritable bowel syndrome)     Mild hematochezia; negative for celiac disease; mild Schatzki's ring; small hiatal hernia; minor gastric erosions; few diverticula   MS (multiple sclerosis) (HCC)    Overweight(278.02)    Negative sleep study-2010, but treated for narcolepsy   Seizure disorder ([place])    Surgical menopause 09/16/2000   age 60    Current Outpatient Medications:    amphetamine-dextroamphetamine (ADDERALL) 15 MG tablet, Take 1 tablet by mouth 2 (two) times daily., Disp: 60 tablet, Rfl: 0   conjugated estrogens (PREMARIN) vaginal cream, Apply 1/2 gram vaginally 3 days per week.  May reduce to twice per week after 4 weeks., Disp: 42.5 g, Rfl: 12   cyanocobalamin (,VITAMIN B-12,) 1000 MCG/ML injection, , Disp: , Rfl:    fluticasone (FLONASE) 50 MCG/ACT nasal spray, Place 2 sprays into both nostrils daily., Disp: 16 g, Rfl: 6   gabapentin (NEURONTIN) 300 MG capsule, Take 1 capsule by mouth in the morning, at noon, and at bedtime., Disp: , Rfl:    levocetirizine (XYZAL) 5 MG tablet, Take 1 tablet (5 mg total) by mouth every evening. For allergies, Disp: 90 tablet, Rfl: 3   levothyroxine (SYNTHROID) 75 MCG tablet, TAKE 1 TABLET BY MOUTH EVERY DAY, Disp: 90 tablet, Rfl: 3   meloxicam (MOBIC) 7.5 MG tablet, Take by mouth., Disp: , Rfl:    mirabegron ER (MYRBETRIQ) 50 MG TB24 tablet, Take 1  tablet (50 mg total) by mouth daily., Disp: 30 tablet, Rfl: 11   oxyCODONE ER (XTAMPZA ER) 9 MG C12A, Take by mouth., Disp: , Rfl:    oxyCODONE-acetaminophen (PERCOCET) 7.5-325 MG tablet, Take 1 tablet by mouth 2 (two) times daily as needed., Disp: , Rfl:    losartan (COZAAR) 50 MG tablet, Take 1 tablet (50 mg total) by mouth daily. For blood pressure, Disp: 90 tablet, Rfl: 3 Social History   Socioeconomic History   Marital status: Divorced    Spouse name: Not on file   Number of children: 4   Years of education: Not on file   Highest education level: Not on file  Occupational History   Occupation: Disabled    Comment:  Fibromyalgia, arthritis  Tobacco Use   Smoking status: Never   Smokeless tobacco: Never  Vaping Use   Vaping Use: Never used  Substance and Sexual Activity   Alcohol use: No    Alcohol/week: 0.0 standard drinks    Comment: minimal use   Drug use: No   Sexual activity: Not on file  Other Topics Concern   Not on file  Social History Narrative   Not on file   Social Determinants of Health   Financial Resource Strain: Not on file  Food Insecurity: Not on file  Transportation Needs: Not on file  Physical Activity: Not on file  Stress: Not on file  Social Connections: Not on file  Intimate Partner Violence: Not on file   Family History  Problem Relation Age of Onset   Depression Mother        suicide   Alcohol abuse Father    Coronary artery disease Father     Objective: Office vital signs reviewed. BP 120/73   Pulse (!) 59   Temp 97.7 F (36.5 C) (Temporal)   Resp 20   Ht '5\' 9"'  (1.753 m)   Wt 201 lb (91.2 kg)   LMP 09/16/1994   BMI 29.68 kg/m   Physical Examination:  General: Awake, alert, nontoxic, No acute distress HEENT: Sclera white.  No rhinorrhea.  No conjunctival Injection.  No goiter or exophthalmos Cardio: regular rate and rhythm, S1S2 heard, no murmurs appreciated Pulm: clear to auscultation bilaterally, no wheezes, rhonchi or rales; normal work of breathing on room air Extremities: Warm, well-perfused.  No edema MSK: Ambulating independently with normal gait and station Neuro: no tremor  Assessment/ Plan: 56 y.o. female   Essential hypertension - Plan: losartan (COZAAR) 50 MG tablet, CMP14+EGFR  Seasonal allergic rhinitis due to pollen - Plan: levocetirizine (XYZAL) 5 MG tablet, fluticasone (FLONASE) 50 MCG/ACT nasal spray  Acquired hypothyroidism - Plan: CMP14+EGFR, T4, free, TSH  Pure hypercholesterolemia - Plan: CMP14+EGFR, Lipid panel  Overweight (BMI 25.0-29.9) - Plan: Bayer DCA Hb A1c Waived  Blood pressure now under excellent control.   Losartan renewed.  We will check renal function given initiation of ARB and questionable bump in renal function at some point on the NSAID that she is being treated with by her pain specialist.  Xyzal and Flonase sent in for allergy  Check TSH, free T4.  She has been totally out of her medication for over a month because she is been lost to follow-up and no refills continued to be sent in.  I anticipate we will need to restart her on Synthroid 75 mcg and then reconvene again in 3 months for reassessment  Nonfasting lipid collected.  A1c collected as well per her request   Orders Placed This Encounter  Procedures   Bayer DCA Hb A1c Waived    Standing Status:   Future    Standing Expiration Date:   02/14/2023   Meds ordered this encounter  Medications   levocetirizine (XYZAL) 5 MG tablet    Sig: Take 1 tablet (5 mg total) by mouth every evening. For allergies    Dispense:  90 tablet    Refill:  3   fluticasone (FLONASE) 50 MCG/ACT nasal spray    Sig: Place 2 sprays into both nostrils daily.    Dispense:  16 g    Refill:  6   losartan (COZAAR) 50 MG tablet    Sig: Take 1 tablet (50 mg total) by mouth daily. For blood pressure    Dispense:  90 tablet    Refill:  [employer], [place] [phone]

## 2022-02-14 LAB — CMP14+EGFR
ALT: 22 IU/L (ref 0–32)
AST: 25 IU/L (ref 0–40)
Albumin/Globulin Ratio: 1.7 (ref 1.2–2.2)
Albumin: 4.5 g/dL (ref 3.8–4.9)
Alkaline Phosphatase: 101 IU/L (ref 44–121)
BUN/Creatinine Ratio: 12 (ref 9–23)
BUN: 13 mg/dL (ref 6–24)
Bilirubin Total: 0.4 mg/dL (ref 0.0–1.2)
CO2: 23 mmol/L (ref 20–29)
Calcium: 9.6 mg/dL (ref 8.7–10.2)
Chloride: 101 mmol/L (ref 96–106)
Creatinine, Ser: 1.13 mg/dL — ABNORMAL HIGH (ref 0.57–1.00)
Globulin, Total: 2.7 g/dL (ref 1.5–4.5)
Glucose: 99 mg/dL (ref 70–99)
Potassium: 4.2 mmol/L (ref 3.5–5.2)
Sodium: 139 mmol/L (ref 134–144)
Total Protein: 7.2 g/dL (ref 6.0–8.5)
eGFR: 57 mL/min/{1.73_m2} — ABNORMAL LOW (ref >59)

## 2022-02-14 LAB — LIPID PANEL
Chol/HDL Ratio: 3.6 ratio (ref 0.0–4.4)
Cholesterol, Total: 246 mg/dL — ABNORMAL HIGH (ref 100–199)
HDL: 69 mg/dL (ref >39)
LDL Chol Calc (NIH): 164 mg/dL — ABNORMAL HIGH (ref 0–99)
Triglycerides: 75 mg/dL (ref 0–149)
VLDL Cholesterol Cal: 13 mg/dL (ref 5–40)

## 2022-02-14 LAB — BAYER DCA HB A1C WAIVED: HB A1C (BAYER DCA - WAIVED): 5.3 % (ref 4.8–5.6)

## 2022-02-14 LAB — T4, FREE: Free T4: 0.84 ng/dL (ref 0.82–1.77)

## 2022-02-14 LAB — TSH: TSH: 5.36 u[IU]/mL — ABNORMAL HIGH (ref 0.450–4.500)

## 2022-02-15 ENCOUNTER — Other Ambulatory Visit: Payer: Self-pay | Admitting: Family Medicine

## 2022-02-15 DIAGNOSIS — N289 Disorder of kidney and ureter, unspecified: Secondary | ICD-10-CM

## 2022-02-15 DIAGNOSIS — E039 Hypothyroidism, unspecified: Secondary | ICD-10-CM

## 2022-02-15 MED ORDER — LEVOTHYROXINE SODIUM 75 MCG PO TABS: 75.0000 ug | ORAL_TABLET | Freq: Every day | ORAL | 0 refills | Status: DC

## 2022-02-24 ENCOUNTER — Other Ambulatory Visit: Payer: Self-pay | Admitting: Family Medicine

## 2022-02-24 DIAGNOSIS — E039 Hypothyroidism, unspecified: Secondary | ICD-10-CM

## 2022-04-22 DIAGNOSIS — M47816 Spondylosis without myelopathy or radiculopathy, lumbar region: Secondary | ICD-10-CM | POA: Diagnosis not present

## 2022-04-22 DIAGNOSIS — M797 Fibromyalgia: Secondary | ICD-10-CM | POA: Diagnosis not present

## 2022-04-22 DIAGNOSIS — G894 Chronic pain syndrome: Secondary | ICD-10-CM | POA: Diagnosis not present

## 2022-04-22 DIAGNOSIS — M5136 Other intervertebral disc degeneration, lumbar region: Secondary | ICD-10-CM | POA: Diagnosis not present

## 2022-04-22 DIAGNOSIS — Z79891 Long term (current) use of opiate analgesic: Secondary | ICD-10-CM | POA: Diagnosis not present

## 2022-05-23 ENCOUNTER — Telehealth: Payer: Self-pay | Admitting: Family Medicine

## 2022-05-23 NOTE — Telephone Encounter (Signed)
..   Medicaid Managed Care   Unsuccessful Outreach Note  05/23/2022 Name: Colleen Sellers MRN: [id number] DOB: 10/10/1965  Referred by: Raliegh Ip, DO Reason for referral : High Risk Managed Medicaid (I called the patient today to get her scheduled with the MM Team. I left my name and number on her VM.)   An unsuccessful telephone outreach was attempted today. The patient was referred to the case management team for assistance with care management and care coordination.   Follow Up Plan: The care management team will reach out to the patient again over the next 14 days.    Weston Settle Care Guide, High Risk Medicaid Managed Care Embedded Care Coordination [facility]  [facility]    SIGNATURE

## 2022-06-17 ENCOUNTER — Encounter: Payer: Self-pay | Admitting: Family Medicine

## 2022-06-17 ENCOUNTER — Other Ambulatory Visit (HOSPITAL_COMMUNITY)
Admission: RE | Admit: 2022-06-17 | Discharge: 2022-06-17 | Disposition: A | Discharge status: Home / Self Care | Payer: Medicaid Other | Source: Ambulatory Visit | Attending: Family Medicine | Admitting: Family Medicine

## 2022-06-17 ENCOUNTER — Ambulatory Visit (INDEPENDENT_AMBULATORY_CARE_PROVIDER_SITE_OTHER): Payer: Medicaid Other | Admitting: Family Medicine

## 2022-06-17 VITALS — BP 121/86 | HR 66 | Temp 98.5°F | Ht 69.0 in | Wt 199.0 lb

## 2022-06-17 DIAGNOSIS — Z0001 Encounter for general adult medical examination with abnormal findings: Secondary | ICD-10-CM

## 2022-06-17 DIAGNOSIS — M2550 Pain in unspecified joint: Secondary | ICD-10-CM | POA: Diagnosis not present

## 2022-06-17 DIAGNOSIS — Z0000 Encounter for general adult medical examination without abnormal findings: Secondary | ICD-10-CM | POA: Insufficient documentation

## 2022-06-17 DIAGNOSIS — G35 Multiple sclerosis: Secondary | ICD-10-CM

## 2022-06-17 DIAGNOSIS — N1831 Chronic kidney disease, stage 3a: Secondary | ICD-10-CM | POA: Diagnosis not present

## 2022-06-17 DIAGNOSIS — E7800 Pure hypercholesterolemia, unspecified: Secondary | ICD-10-CM

## 2022-06-17 DIAGNOSIS — Z1211 Encounter for screening for malignant neoplasm of colon: Secondary | ICD-10-CM

## 2022-06-17 DIAGNOSIS — I10 Essential (primary) hypertension: Secondary | ICD-10-CM

## 2022-06-17 DIAGNOSIS — K5903 Drug induced constipation: Secondary | ICD-10-CM

## 2022-06-17 DIAGNOSIS — R739 Hyperglycemia, unspecified: Secondary | ICD-10-CM | POA: Diagnosis not present

## 2022-06-17 DIAGNOSIS — J3089 Other allergic rhinitis: Secondary | ICD-10-CM

## 2022-06-17 DIAGNOSIS — E039 Hypothyroidism, unspecified: Secondary | ICD-10-CM | POA: Diagnosis not present

## 2022-06-17 DIAGNOSIS — R1310 Dysphagia, unspecified: Secondary | ICD-10-CM

## 2022-06-17 LAB — BAYER DCA HB A1C WAIVED: HB A1C (BAYER DCA - WAIVED): 5.4 % (ref 4.8–5.6)

## 2022-06-17 MED ORDER — MONTELUKAST SODIUM 10 MG PO TABS: 10.0000 mg | ORAL_TABLET | Freq: Every day | ORAL | 3 refills | Status: DC

## 2022-06-17 MED ORDER — TRULANCE 3 MG PO TABS: 3.0000 mg | ORAL_TABLET | Freq: Every day | ORAL | 0 refills | Status: DC

## 2022-06-17 NOTE — Progress Notes (Signed)
Colleen Sellers is a 56 y.o. female presents to office today for annual physical exam examination.    Concerns today include: 1.  Polyarthralgia/ MS Patient is under close surveillance and management with Dr. Francesco Runner.  Colleen Sellers has known rheumatoid arthritis, multiple sclerosis and spondylosis of the thoracolumbar region.  Colleen Sellers is treated with opioids.  Colleen Sellers apparently needs some type of rheumatoid lab but is not sure what.    Colleen Sellers notes chronic constipation as a result but is not currently treated with any medications.  Linzess was previously somewhat helpful and Colleen Sellers would be willing to retrial this.  Colleen Sellers denies any blood in stool.  Had colonoscopy done with Dr. Gala Romney in [place].  Colleen Sellers reports dysphagia but no regurgitation.  Has not had this evaluated further with GI.  Was not aware that Dr. Merlene Laughter was retiring.  Has not yet established with new neurologist  2.  Allergic rhinitis Patient reports that symptoms are unrelieved by Flonase and Xyzal.  Would like to try something else for allergy symptoms as Colleen Sellers continues to have rhinorrhea.  3.  Hypothyroidism Patient is compliant with Synthroid 75 mcg daily.  Reports some dysphagia as above.  No tremor, heart palpitations.  Reports constipation as above.  Diet: fair, Exercise: no structured Last eye exam: UTD Last dental exam: UTD Last colonoscopy: Dr Gala Romney Last mammogram: UTD Last pap smear: hysterectomy Refills needed today: none Immunizations needed: Declines tetanus and shingles Immunization History  Administered Date(s) Administered   Influenza,inj,Quad PF,6+ Mos 08/20/2018   Tdap 03/15/2012     Past Medical History:  Diagnosis Date   Acid reflux    Anxiety    Arthritis    Asthma    Chest pain    Dyspnea, diaphoresis   CHF (congestive heart failure) (HCC)    Chronic back pain    Degenerative joint disease    Also scoliosis and pectus deformity   Depression    Fibromyalgia    Hyperlipidemia    IBS (irritable bowel  syndrome)    Mild hematochezia; negative for celiac disease; mild Schatzki's ring; small hiatal hernia; minor gastric erosions; few diverticula   MS (multiple sclerosis) (HCC)    Overweight(278.02)    Negative sleep study-2010, but treated for narcolepsy   Seizure disorder [facility])    Surgical menopause 09/16/2000   age 49   Social History   Socioeconomic History   Marital status: Divorced    Spouse name: Not on file   Number of children: 4   Years of education: Not on file   Highest education level: Not on file  Occupational History   Occupation: Disabled    Comment: Fibromyalgia, arthritis  Tobacco Use   Smoking status: Never   Smokeless tobacco: Never  Vaping Use   Vaping Use: Never used  Substance and Sexual Activity   Alcohol use: No    Alcohol/week: 0.0 standard drinks of alcohol    Comment: minimal use   Drug use: No   Sexual activity: Not on file  Other Topics Concern   Not on file  Social History Narrative   Not on file   Social Determinants of Health   Financial Resource Strain: Not on file  Food Insecurity: Not on file  Transportation Needs: Not on file  Physical Activity: Not on file  Stress: Not on file  Social Connections: Not on file  Intimate Partner Violence: Not on file   Past Surgical History:  Procedure Laterality Date   BRAIN SURGERY  CHOLECYSTECTOMY     COLONOSCOPY  2001, 2010   negative/friable anal canal scattered pancolonic diverticula   ESOPHAGOGASTRODUODENOSCOPY  11/22/08   noncritical Schatzi ring/small hiatal herina/tiny antral reosions   RHINOPLASTY     Deviated septum   SALPINGOOPHORECTOMY  2002   Bilateral   TUBAL LIGATION     VAGINAL HYSTERECTOMY  1996   Family History  Problem Relation Age of Onset   Depression Mother        suicide   Alcohol abuse Father    Coronary artery disease Father     Current Outpatient Medications:    amphetamine-dextroamphetamine (ADDERALL) 15 MG tablet, Take 1 tablet by mouth 2 (two)  times daily., Disp: 60 tablet, Rfl: 0   fluticasone (FLONASE) 50 MCG/ACT nasal spray, Place 2 sprays into both nostrils daily., Disp: 16 g, Rfl: 6   levocetirizine (XYZAL) 5 MG tablet, Take 1 tablet (5 mg total) by mouth every evening. For allergies, Disp: 90 tablet, Rfl: 3   levothyroxine (SYNTHROID) 75 MCG tablet, Take 1 tablet (75 mcg total) by mouth daily. Must repeat labs in 6 weeks for refills, Disp: 90 tablet, Rfl: 0   losartan (COZAAR) 50 MG tablet, Take 1 tablet (50 mg total) by mouth daily. For blood pressure, Disp: 90 tablet, Rfl: 3   oxyCODONE ER (XTAMPZA ER) 9 MG C12A, Take by mouth., Disp: , Rfl:    oxyCODONE-acetaminophen (PERCOCET) 7.5-325 MG tablet, Take 1 tablet by mouth 2 (two) times daily as needed., Disp: , Rfl:    conjugated estrogens (PREMARIN) vaginal cream, Apply 1/2 gram vaginally 3 days per week.  May reduce to twice per week after 4 weeks. (Patient not taking: Reported on 06/17/2022), Disp: 42.5 g, Rfl: 12  Allergies  Allergen Reactions   Ceftin Other (See Comments)    Unknown   Codeine Nausea And Vomiting   Morphine Hives and Itching   Adhesive [Tape] Rash   Latex Rash     ROS: Review of Systems A comprehensive review of systems was negative except for: Gastrointestinal: positive for constipation and dysphagia Genitourinary: positive for urinary incontinence Musculoskeletal: positive for arthralgias and back pain Allergic/Immunologic: positive for rhinorrhea/ rhinitis    Physical exam BP 121/86   Pulse 66   Temp 98.5 F (36.9 C)   Ht 5\' 9"  (1.753 m)   Wt 199 lb (90.3 kg)   LMP 09/16/1994   SpO2 99%   BMI 29.39 kg/m  General appearance: alert, cooperative, appears stated age, and no distress Head: Normocephalic, without obvious abnormality, atraumatic Eyes: negative findings: lids and lashes normal, conjunctivae and sclerae normal, corneas clear, and pupils equal, round, reactive to light and accomodation Ears: normal TM's and external ear canals  both ears Nose: Nares normal. Septum midline. Mucosa normal. No drainage or sinus tenderness. Throat: lips, mucosa, and tongue normal; teeth and gums normal Neck: no adenopathy, supple, symmetrical, trachea midline, and thyroid not enlarged, symmetric, no tenderness/mass/nodules Back:  Increased kyphotic curve of the thoracic spine present. Lungs: clear to auscultation bilaterally Heart: regular rate and rhythm, S1, S2 normal, no murmur, click, rub or gallop Abdomen: soft, non-tender; bowel sounds normal; no masses,  no organomegaly Pelvic: external genitalia normal, rectovaginal septum normal, vagina normal without discharge, and cervix surgically absent.  Colleen Sellers has a small skin tag internally and a well-healed postsurgical scar noted in the horizontal Extremities: extremities normal, atraumatic, no cyanosis or edema Pulses: 2+ and symmetric Skin: Skin color, texture, turgor normal. No rashes or lesions Lymph nodes: Cervical, supraclavicular, and  axillary nodes normal. Neurologic: Grossly normal  Flowsheet Row Office Visit from 06/17/2022 in [place]  PHQ-2 Total Score 1       Assessment/ Plan: SOLITA KYNARD here for annual physical exam.   Annual physical exam - Plan: Cytology - PAP([place])  Stage 3a chronic kidney disease ([place]) - Plan: Microalbumin / creatinine urine ratio, Renal Function Panel, CBC, VITAMIN D 25 Hydroxy (Vit-D Deficiency, Fractures)  Acquired hypothyroidism - Plan: TSH, T4, Free  Essential hypertension - Plan: Microalbumin / creatinine urine ratio, Renal Function Panel  Pure hypercholesterolemia - Plan: Lipid Panel  Multiple sclerosis (HCC)  Drug-induced constipation - Plan: Plecanatide (TRULANCE) 3 MG TABS  Elevated serum glucose - Plan: Bayer DCA Hb A1c Waived  Seasonal allergic rhinitis due to other allergic trigger - Plan: montelukast (SINGULAIR) 10 MG tablet  Polyarthralgia - Plan: Rheumatoid factor  Dysphagia,  unspecified type - Plan: Ambulatory referral to Gastroenterology  Screen for colon cancer - Plan: Ambulatory referral to Gastroenterology  Check renal function, urine microalbumin, vitamin D, CBC given impaired renal function noted on last several labs.  Fasting lipid panel collected  MS is chronic and stable.  Colleen Sellers may need new neurologist and Colleen Sellers will contact me if Colleen Sellers will need a new referral to someone in [place] of Trulance for drug-induced constipation.  May need to substitute with MiraLAX or Linzess pending insurance coverage  Check A1c given history of elevated sugar  Switch to Singulair since Xyzal not helping with allergy symptoms  For the polyarthralgia and reported history of rheumatoid arthritis rheumatoid lab has been collected.  Colleen Sellers is not seeing a rheumatologist but her back specialist requested rheumatoid labs.  They did not specifically state what they were looking for in the last note so RF was collected today.  May need referral back to rheumatology  Apparently had a colonoscopy done in [place] with Dr. Gala Romney but I could not find previous records of this.  I placed a new referral to him for the dysphagia along with colon cancer screening.  Counseled on healthy lifestyle choices, including diet (rich in fruits, vegetables and lean meats and low in salt and simple carbohydrates) and exercise (at least 30 minutes of moderate physical activity daily).  Patient to follow up in 4-9m for thyroid recheck, f/u singulair  Taylen Osorto M. Lajuana Ripple, DO

## 2022-06-18 ENCOUNTER — Other Ambulatory Visit: Payer: Self-pay | Admitting: Family Medicine

## 2022-06-18 DIAGNOSIS — E039 Hypothyroidism, unspecified: Secondary | ICD-10-CM

## 2022-06-18 LAB — LIPID PANEL
Chol/HDL Ratio: 4.1 ratio (ref 0.0–4.4)
Cholesterol, Total: 241 mg/dL — ABNORMAL HIGH (ref 100–199)
HDL: 59 mg/dL (ref >39)
LDL Chol Calc (NIH): 160 mg/dL — ABNORMAL HIGH (ref 0–99)
Triglycerides: 126 mg/dL (ref 0–149)
VLDL Cholesterol Cal: 22 mg/dL (ref 5–40)

## 2022-06-18 LAB — RENAL FUNCTION PANEL
Albumin: 4.5 g/dL (ref 3.8–4.9)
BUN/Creatinine Ratio: 13 (ref 9–23)
BUN: 13 mg/dL (ref 6–24)
CO2: 24 mmol/L (ref 20–29)
Calcium: 9.7 mg/dL (ref 8.7–10.2)
Chloride: 102 mmol/L (ref 96–106)
Creatinine, Ser: 1.02 mg/dL — ABNORMAL HIGH (ref 0.57–1.00)
Glucose: 95 mg/dL (ref 70–99)
Phosphorus: 3.8 mg/dL (ref 3.0–4.3)
Potassium: 4 mmol/L (ref 3.5–5.2)
Sodium: 139 mmol/L (ref 134–144)
eGFR: 64 mL/min/{1.73_m2} (ref >59)

## 2022-06-18 LAB — CBC
Hematocrit: 39.2 % (ref 34.0–46.6)
Hemoglobin: 13.5 g/dL (ref 11.1–15.9)
MCH: 31.2 pg (ref 26.6–33.0)
MCHC: 34.4 g/dL (ref 31.5–35.7)
MCV: 91 fL (ref 79–97)
Platelets: 398 10*3/uL (ref 150–450)
RBC: 4.33 x10E6/uL (ref 3.77–5.28)
RDW: 11.8 % (ref 11.7–15.4)
WBC: 7.6 10*3/uL (ref 3.4–10.8)

## 2022-06-18 LAB — RHEUMATOID FACTOR: Rheumatoid fact SerPl-aCnc: <10 IU/mL (ref <14.0)

## 2022-06-18 LAB — MICROALBUMIN / CREATININE URINE RATIO
Creatinine, Urine: 151.4 mg/dL
Microalb/Creat Ratio: <2 mg/g creat (ref 0–29)
Microalbumin, Urine: <3 ug/mL

## 2022-06-18 LAB — TSH: TSH: 2.06 u[IU]/mL (ref 0.450–4.500)

## 2022-06-18 LAB — T4, FREE: Free T4: 1.32 ng/dL (ref 0.82–1.77)

## 2022-06-18 LAB — VITAMIN D 25 HYDROXY (VIT D DEFICIENCY, FRACTURES): Vit D, 25-Hydroxy: 20.1 ng/mL — ABNORMAL LOW (ref 30.0–100.0)

## 2022-06-18 MED ORDER — LEVOTHYROXINE SODIUM 75 MCG PO TABS: 75.0000 ug | ORAL_TABLET | Freq: Every day | ORAL | 3 refills | Status: DC

## 2022-06-19 ENCOUNTER — Telehealth: Payer: Self-pay | Admitting: Family Medicine

## 2022-06-19 LAB — CYTOLOGY - PAP
Comment: NEGATIVE
Diagnosis: NEGATIVE
High risk HPV: NEGATIVE

## 2022-06-19 NOTE — Telephone Encounter (Signed)
Pt aware.

## 2022-06-20 ENCOUNTER — Encounter: Payer: Self-pay | Admitting: *Deleted

## 2022-07-10 ENCOUNTER — Ambulatory Visit: Payer: Medicaid Other | Admitting: Gastroenterology

## 2022-07-23 DIAGNOSIS — M797 Fibromyalgia: Secondary | ICD-10-CM | POA: Diagnosis not present

## 2022-07-23 DIAGNOSIS — G894 Chronic pain syndrome: Secondary | ICD-10-CM | POA: Diagnosis not present

## 2022-07-23 DIAGNOSIS — M5136 Other intervertebral disc degeneration, lumbar region: Secondary | ICD-10-CM | POA: Diagnosis not present

## 2022-07-23 DIAGNOSIS — M47816 Spondylosis without myelopathy or radiculopathy, lumbar region: Secondary | ICD-10-CM | POA: Diagnosis not present

## 2022-07-23 NOTE — H&P (View-Only) (Signed)
GI Office Note    Referring Provider: Raliegh Ip, DO Primary Care Physician:  Raliegh Ip, DO  Primary Gastroenterologist: Roetta Sessions, MD   Chief Complaint   Chief Complaint  Patient presents with   Dysphagia    States food gets stuck in her throat     History of Present Illness   Colleen Sellers is a 56 y.o. female presenting today at the request of Dr. Nadine Counts for further evaluation of dysphagia.  EGD/colonoscopy in 2010: noncritical Schatzki ring, small hh, tiny antral erosions, friable anal canal, scattered pancolonic diverticula. TI normal.   Patient complains of dysphagia occurring off and on for a few years but worse the last several months.  Difficulty with solids, liquids and pills.  Feels food gets stuck, sometimes brings it back up.  Occasionally has heartburn.  Chronically constipated in the setting of oxycodone use.  Linzess caused stomach pain.  PCP recently gave her samples of Trulance but she has not started.  She denies melena or rectal bleeding.  Medications   Current Outpatient Medications  Medication Sig Dispense Refill   amphetamine-dextroamphetamine (ADDERALL) 10 MG tablet Take 10 mg by mouth 2 (two) times daily.     fluticasone (FLONASE) 50 MCG/ACT nasal spray Place 2 sprays into both nostrils daily. 16 g 6   gabapentin (NEURONTIN) 300 MG capsule Take 300 mg by mouth 3 (three) times daily.     levothyroxine (SYNTHROID) 75 MCG tablet Take 1 tablet (75 mcg total) by mouth daily. 90 tablet 3   losartan (COZAAR) 50 MG tablet Take 1 tablet (50 mg total) by mouth daily. For blood pressure 90 tablet 3   montelukast (SINGULAIR) 10 MG tablet Take 1 tablet (10 mg total) by mouth at bedtime. 90 tablet 3   oxyCODONE-acetaminophen (PERCOCET) 10-325 MG tablet Take 1 tablet by mouth every 8 (eight) hours as needed.     tiZANidine (ZANAFLEX) 4 MG tablet Take 4 mg by mouth 3 (three) times daily.     XTAMPZA ER 13.5 MG C12A Take 1 capsule by mouth  2 (two) times daily.     No current facility-administered medications for this visit.    Allergies   Allergies as of 07/24/2022 - Review Complete 07/24/2022  Allergen Reaction Noted   Ceftin Other (See Comments) 10/15/2011   Codeine Nausea And Vomiting 06/15/2010   Morphine Hives and Itching    Adhesive [tape] Rash 06/12/2014   Latex Rash 06/12/2014    Past Medical History   Past Medical History:  Diagnosis Date   Acid reflux    Anxiety    Arthritis    Asthma    Chest pain    Dyspnea, diaphoresis   CHF (congestive heart failure) (HCC)    Chronic back pain    Degenerative joint disease    Also scoliosis and pectus deformity   Depression    Fibromyalgia    Hyperlipidemia    IBS (irritable bowel syndrome)    Mild hematochezia; negative for celiac disease; mild Schatzki's ring; small hiatal hernia; minor gastric erosions; few diverticula   MS (multiple sclerosis) (HCC)    Overweight(278.02)    Negative sleep study-2010, but treated for narcolepsy   Seizure disorder [facility])    Surgical menopause 09/16/2000   age 16    Past Surgical History   Past Surgical History:  Procedure Laterality Date   BRAIN SURGERY     CHOLECYSTECTOMY     COLONOSCOPY  2001, 2010   negative/friable anal canal  scattered pancolonic diverticula   ESOPHAGOGASTRODUODENOSCOPY  11/22/08   noncritical Schatzi ring/small hiatal herina/tiny antral reosions   RHINOPLASTY     Deviated septum   SALPINGOOPHORECTOMY  2002   Bilateral   TUBAL LIGATION     VAGINAL HYSTERECTOMY  1996    Past Family History   Family History  Problem Relation Age of Onset   Depression Mother        suicide   Alcohol abuse Father    Coronary artery disease Father    Colon cancer Maternal Grandfather     Past Social History   Social History   Socioeconomic History   Marital status: Divorced    Spouse name: Not on file   Number of children: 4   Years of education: Not on file   Highest education level: Not on  file  Occupational History   Occupation: Disabled    Comment: Fibromyalgia, arthritis  Tobacco Use   Smoking status: Never   Smokeless tobacco: Never  Vaping Use   Vaping Use: Never used  Substance and Sexual Activity   Alcohol use: No    Alcohol/week: 0.0 standard drinks of alcohol    Comment: minimal use   Drug use: No   Sexual activity: Yes  Other Topics Concern   Not on file  Social History Narrative   Expecting a granddaughter in march 2024.   Social Determinants of Health   Financial Resource Strain: Not on file  Food Insecurity: Not on file  Transportation Needs: Not on file  Physical Activity: Not on file  Stress: Not on file  Social Connections: Not on file  Intimate Partner Violence: Not on file    Review of Systems   General: Negative for anorexia, weight loss, fever, chills, fatigue, weakness. Eyes: Negative for vision changes.  ENT: Negative for hoarseness,  nasal congestion. See hpi CV: Negative for chest pain, angina, palpitations, dyspnea on exertion, peripheral edema.  Respiratory: Negative for dyspnea at rest, dyspnea on exertion, cough, sputum, wheezing.  GI: See history of present illness. GU:  Negative for dysuria, hematuria, urinary incontinence, urinary frequency, nocturnal urination.  MS: Negative for joint pain, low back pain.  Derm: Negative for rash or itching.  Neuro: Negative for weakness, abnormal sensation, seizure, frequent headaches, memory loss,  confusion.  Psych: Negative for anxiety, depression, suicidal ideation, hallucinations.  Endo: Negative for unusual weight change.  Heme: Negative for bruising or bleeding. Allergy: Negative for rash or hives.  Physical Exam   BP (!) 146/76 (BP Location: Right Arm, Patient Position: Sitting, Cuff Size: Large)   Pulse 70   Temp 98 F (36.7 C) (Oral)   Ht 5\' 8"  (1.727 m)   Wt 202 lb 9.6 oz (91.9 kg)   LMP 09/16/1994   SpO2 96%   BMI 30.81 kg/m    General: Well-nourished,  well-developed in no acute distress.  Head: Normocephalic, atraumatic.   Eyes: Conjunctiva pink, no icterus. Mouth: Oropharyngeal mucosa moist and pink , no lesions erythema or exudate. Neck: Supple without thyromegaly, masses, or lymphadenopathy.  Lungs: Clear to auscultation bilaterally.  Heart: Regular rate and rhythm, no murmurs rubs or gallops.  Abdomen: Bowel sounds are normal, nontender, nondistended, no hepatosplenomegaly or masses,  no abdominal bruits or hernia, no rebound or guarding.   Rectal: not performed Extremities: No lower extremity edema. No clubbing or deformities.  Neuro: Alert and oriented x 4 , grossly normal neurologically.  Skin: Warm and dry, no rash or jaundice.   Psych: Alert and cooperative,  normal mood and affect.  Labs   Lab Results  Component Value Date   CREATININE 1.02 (H) 06/17/2022   BUN 13 06/17/2022   NA 139 06/17/2022   K 4.0 06/17/2022   CL 102 06/17/2022   CO2 24 06/17/2022   Lab Results  Component Value Date   ALT 22 02/13/2022   AST 25 02/13/2022   ALKPHOS 101 02/13/2022   BILITOT 0.4 02/13/2022   Lab Results  Component Value Date   WBC 7.6 06/17/2022   HGB 13.5 06/17/2022   HCT 39.2 06/17/2022   MCV 91 06/17/2022   PLT 398 06/17/2022   Lab Results  Component Value Date   TSH 2.060 06/17/2022    Imaging Studies   No results found.  Assessment   Dysphagia: Chronic but progressive symptoms.  History of Schatzki ring in the past.  Constipation: Chronic, likely opioid induced.  Cancer screening: Overdue.  We will plan on updating colonoscopy at a later date after her dysphagia is addressed.  We will need to make sure constipation is improved.   PLAN   EGD with esophageal dilation in the near future.  ASA 2.  I have discussed the risks, alternatives, benefits with regards to but not limited to the risk of reaction to medication, bleeding, infection, perforation and the patient is agreeable to proceed. Written consent  to be obtained.  Patient interested in whichever provider can perform procedure in the next few weeks. Trulance 3 mg daily, samples already provided by her PCP.  She will let me know if not effective.  Laureen Ochs. Bobby Rumpf, [place], [facility]

## 2022-07-23 NOTE — Progress Notes (Unsigned)
GI Office Note    Referring Provider: Raliegh Ip, DO Primary Care Physician:  Raliegh Ip, DO  Primary Gastroenterologist:  Chief Complaint   No chief complaint on file.    History of Present Illness   Colleen Sellers is a 56 y.o. female presenting today    EGD/colonoscopy in 2010: noncritical Schatki ring, small hh, tiny antral erosions, friable anal canal, scattered pancolonic diverticula. TI normal.         Medications   Current Outpatient Medications  Medication Sig Dispense Refill   amphetamine-dextroamphetamine (ADDERALL) 15 MG tablet Take 1 tablet by mouth 2 (two) times daily. 60 tablet 0   conjugated estrogens (PREMARIN) vaginal cream Apply 1/2 gram vaginally 3 days per week.  May reduce to twice per week after 4 weeks. (Patient not taking: Reported on 06/17/2022) 42.5 g 12   fluticasone (FLONASE) 50 MCG/ACT nasal spray Place 2 sprays into both nostrils daily. 16 g 6   levocetirizine (XYZAL) 5 MG tablet Take 1 tablet (5 mg total) by mouth every evening. For allergies 90 tablet 3   levothyroxine (SYNTHROID) 75 MCG tablet Take 1 tablet (75 mcg total) by mouth daily. 90 tablet 3   losartan (COZAAR) 50 MG tablet Take 1 tablet (50 mg total) by mouth daily. For blood pressure 90 tablet 3   montelukast (SINGULAIR) 10 MG tablet Take 1 tablet (10 mg total) by mouth at bedtime. 90 tablet 3   oxyCODONE ER (XTAMPZA ER) 9 MG C12A Take by mouth.     oxyCODONE-acetaminophen (PERCOCET) 7.5-325 MG tablet Take 1 tablet by mouth 2 (two) times daily as needed.     Plecanatide (TRULANCE) 3 MG TABS Take 3 mg by mouth daily. 3 tablet 0   No current facility-administered medications for this visit.    Allergies   Allergies as of 07/24/2022 - Review Complete 06/17/2022  Allergen Reaction Noted   Ceftin Other (See Comments) 10/15/2011   Codeine Nausea And Vomiting 06/15/2010   Morphine Hives and Itching    Adhesive [tape] Rash 06/12/2014   Latex Rash 06/12/2014     Past Medical History   Past Medical History:  Diagnosis Date   Acid reflux    Anxiety    Arthritis    Asthma    Chest pain    Dyspnea, diaphoresis   CHF (congestive heart failure) (HCC)    Chronic back pain    Degenerative joint disease    Also scoliosis and pectus deformity   Depression    Fibromyalgia    Hyperlipidemia    IBS (irritable bowel syndrome)    Mild hematochezia; negative for celiac disease; mild Schatzki's ring; small hiatal hernia; minor gastric erosions; few diverticula   MS (multiple sclerosis) (HCC)    Overweight(278.02)    Negative sleep study-2010, but treated for narcolepsy   Seizure disorder [facility])    Surgical menopause 09/16/2000   age 11    Past Surgical History   Past Surgical History:  Procedure Laterality Date   BRAIN SURGERY     CHOLECYSTECTOMY     COLONOSCOPY  2001, 2010   negative/friable anal canal scattered pancolonic diverticula   ESOPHAGOGASTRODUODENOSCOPY  11/22/08   noncritical Schatzi ring/small hiatal herina/tiny antral reosions   RHINOPLASTY     Deviated septum   SALPINGOOPHORECTOMY  2002   Bilateral   TUBAL LIGATION     VAGINAL HYSTERECTOMY  1996    Past Family History   Family History  Problem Relation Age of Onset  Depression Mother        suicide   Alcohol abuse Father    Coronary artery disease Father     Past Social History   Social History   Socioeconomic History   Marital status: Divorced    Spouse name: Not on file   Number of children: 4   Years of education: Not on file   Highest education level: Not on file  Occupational History   Occupation: Disabled    Comment: Fibromyalgia, arthritis  Tobacco Use   Smoking status: Never   Smokeless tobacco: Never  Vaping Use   Vaping Use: Never used  Substance and Sexual Activity   Alcohol use: No    Alcohol/week: 0.0 standard drinks of alcohol    Comment: minimal use   Drug use: No   Sexual activity: Not on file  Other Topics Concern   Not on  file  Social History Narrative   Expecting a granddaughter in march 2024.   Social Determinants of Health   Financial Resource Strain: Not on file  Food Insecurity: Not on file  Transportation Needs: Not on file  Physical Activity: Not on file  Stress: Not on file  Social Connections: Not on file  Intimate Partner Violence: Not on file    Review of Systems   General: Negative for anorexia, weight loss, fever, chills, fatigue, weakness. Eyes: Negative for vision changes.  ENT: Negative for hoarseness, difficulty swallowing , nasal congestion. CV: Negative for chest pain, angina, palpitations, dyspnea on exertion, peripheral edema.  Respiratory: Negative for dyspnea at rest, dyspnea on exertion, cough, sputum, wheezing.  GI: See history of present illness. GU:  Negative for dysuria, hematuria, urinary incontinence, urinary frequency, nocturnal urination.  MS: Negative for joint pain, low back pain.  Derm: Negative for rash or itching.  Neuro: Negative for weakness, abnormal sensation, seizure, frequent headaches, memory loss,  confusion.  Psych: Negative for anxiety, depression, suicidal ideation, hallucinations.  Endo: Negative for unusual weight change.  Heme: Negative for bruising or bleeding. Allergy: Negative for rash or hives.  Physical Exam   LMP 09/16/1994    General: Well-nourished, well-developed in no acute distress.  Head: Normocephalic, atraumatic.   Eyes: Conjunctiva pink, no icterus. Mouth: Oropharyngeal mucosa moist and pink , no lesions erythema or exudate. Neck: Supple without thyromegaly, masses, or lymphadenopathy.  Lungs: Clear to auscultation bilaterally.  Heart: Regular rate and rhythm, no murmurs rubs or gallops.  Abdomen: Bowel sounds are normal, nontender, nondistended, no hepatosplenomegaly or masses,  no abdominal bruits or hernia, no rebound or guarding.   Rectal: *** Extremities: No lower extremity edema. No clubbing or deformities.  Neuro:  Alert and oriented x 4 , grossly normal neurologically.  Skin: Warm and dry, no rash or jaundice.   Psych: Alert and cooperative, normal mood and affect.  Labs   *** Imaging Studies   No results found.  Assessment       PLAN   ***   Laureen Ochs. Bobby Rumpf, [place], [facility]

## 2022-07-24 ENCOUNTER — Encounter: Payer: Self-pay | Admitting: Gastroenterology

## 2022-07-24 ENCOUNTER — Ambulatory Visit (INDEPENDENT_AMBULATORY_CARE_PROVIDER_SITE_OTHER): Payer: Medicaid Other | Admitting: Gastroenterology

## 2022-07-24 VITALS — BP 146/76 | HR 70 | Temp 98.0°F | Ht 68.0 in | Wt 202.6 lb

## 2022-07-24 DIAGNOSIS — R1310 Dysphagia, unspecified: Secondary | ICD-10-CM | POA: Diagnosis not present

## 2022-07-24 NOTE — Patient Instructions (Signed)
Upper endoscopy to be done. See separate instructions.  Try Trulance as provided by your PCP. Let me know if not effective.

## 2022-07-29 ENCOUNTER — Telehealth (INDEPENDENT_AMBULATORY_CARE_PROVIDER_SITE_OTHER): Payer: Self-pay | Admitting: *Deleted

## 2022-07-29 NOTE — Telephone Encounter (Signed)
Called pt. Scheduled for EGD/ED with Dr. Jena Gauss, asa 2 on 12/7 at 9am. Aware will send instructions via mail.

## 2022-08-14 DIAGNOSIS — I10 Essential (primary) hypertension: Secondary | ICD-10-CM | POA: Diagnosis not present

## 2022-08-14 DIAGNOSIS — G4712 Idiopathic hypersomnia without long sleep time: Secondary | ICD-10-CM | POA: Diagnosis not present

## 2022-08-14 DIAGNOSIS — Z79899 Other long term (current) drug therapy: Secondary | ICD-10-CM | POA: Diagnosis not present

## 2022-08-14 DIAGNOSIS — G9332 Myalgic encephalomyelitis/chronic fatigue syndrome: Secondary | ICD-10-CM | POA: Diagnosis not present

## 2022-08-22 ENCOUNTER — Ambulatory Visit (HOSPITAL_COMMUNITY): Payer: Medicaid Other | Admitting: Anesthesiology

## 2022-08-22 ENCOUNTER — Other Ambulatory Visit: Payer: Self-pay

## 2022-08-22 ENCOUNTER — Encounter (HOSPITAL_COMMUNITY): Payer: Self-pay | Admitting: Internal Medicine

## 2022-08-22 ENCOUNTER — Encounter (HOSPITAL_COMMUNITY)
Admission: RE | Disposition: A | Discharge status: Home / Self Care | Payer: Self-pay | Source: Ambulatory Visit | Attending: Internal Medicine

## 2022-08-22 ENCOUNTER — Ambulatory Visit (HOSPITAL_BASED_OUTPATIENT_CLINIC_OR_DEPARTMENT_OTHER): Payer: Medicaid Other | Admitting: Anesthesiology

## 2022-08-22 ENCOUNTER — Ambulatory Visit (HOSPITAL_COMMUNITY)
Admission: RE | Admit: 2022-08-22 | Discharge: 2022-08-22 | Disposition: A | Discharge status: Home / Self Care | Payer: Medicaid Other | Source: Ambulatory Visit | Attending: Internal Medicine | Admitting: Internal Medicine

## 2022-08-22 DIAGNOSIS — K222 Esophageal obstruction: Secondary | ICD-10-CM | POA: Diagnosis not present

## 2022-08-22 DIAGNOSIS — R1310 Dysphagia, unspecified: Secondary | ICD-10-CM | POA: Diagnosis not present

## 2022-08-22 DIAGNOSIS — M797 Fibromyalgia: Secondary | ICD-10-CM | POA: Diagnosis not present

## 2022-08-22 DIAGNOSIS — F418 Other specified anxiety disorders: Secondary | ICD-10-CM

## 2022-08-22 DIAGNOSIS — M1990 Unspecified osteoarthritis, unspecified site: Secondary | ICD-10-CM | POA: Diagnosis not present

## 2022-08-22 DIAGNOSIS — K5909 Other constipation: Secondary | ICD-10-CM | POA: Insufficient documentation

## 2022-08-22 DIAGNOSIS — F909 Attention-deficit hyperactivity disorder, unspecified type: Secondary | ICD-10-CM | POA: Diagnosis not present

## 2022-08-22 DIAGNOSIS — G40909 Epilepsy, unspecified, not intractable, without status epilepticus: Secondary | ICD-10-CM | POA: Insufficient documentation

## 2022-08-22 DIAGNOSIS — G35 Multiple sclerosis: Secondary | ICD-10-CM | POA: Insufficient documentation

## 2022-08-22 DIAGNOSIS — I509 Heart failure, unspecified: Secondary | ICD-10-CM

## 2022-08-22 DIAGNOSIS — I110 Hypertensive heart disease with heart failure: Secondary | ICD-10-CM | POA: Diagnosis not present

## 2022-08-22 DIAGNOSIS — Z79891 Long term (current) use of opiate analgesic: Secondary | ICD-10-CM | POA: Insufficient documentation

## 2022-08-22 DIAGNOSIS — E039 Hypothyroidism, unspecified: Secondary | ICD-10-CM | POA: Insufficient documentation

## 2022-08-22 HISTORY — PX: MALONEY DILATION: SHX5535

## 2022-08-22 HISTORY — PX: ESOPHAGOGASTRODUODENOSCOPY (EGD) WITH PROPOFOL: SHX5813

## 2022-08-22 SURGERY — ESOPHAGOGASTRODUODENOSCOPY (EGD) WITH PROPOFOL
Anesthesia: General

## 2022-08-22 MED ORDER — PROPOFOL 500 MG/50ML IV EMUL
INTRAVENOUS | Status: DC | PRN
  Administered 2022-08-22: 180 ug/kg/min via INTRAVENOUS

## 2022-08-22 MED ORDER — LACTATED RINGERS IV SOLN: INTRAVENOUS | Status: DC

## 2022-08-22 MED ORDER — PROPOFOL 10 MG/ML IV BOLUS
INTRAVENOUS | Status: DC | PRN
  Administered 2022-08-22: 80 mg via INTRAVENOUS

## 2022-08-22 MED ORDER — PROPOFOL 500 MG/50ML IV EMUL
INTRAVENOUS | Status: AC
  Filled 2022-08-22: qty 50

## 2022-08-22 MED ORDER — EPHEDRINE 5 MG/ML INJ
INTRAVENOUS | Status: AC
  Filled 2022-08-22: qty 5

## 2022-08-22 NOTE — Anesthesia Postprocedure Evaluation (Signed)
Anesthesia Post Note  Patient: Colleen Sellers  Procedure(s) Performed: ESOPHAGOGASTRODUODENOSCOPY (EGD) WITH PROPOFOL MALONEY DILATION  Patient location during evaluation: Phase II Anesthesia Type: General Level of consciousness: awake and alert and oriented Pain management: pain level controlled Vital Signs Assessment: post-procedure vital signs reviewed and stable Respiratory status: spontaneous breathing, nonlabored ventilation and respiratory function stable Cardiovascular status: blood pressure returned to baseline and stable Postop Assessment: no apparent nausea or vomiting Anesthetic complications: no  No notable events documented.   Last Vitals:  Vitals:   08/22/22 1013 08/22/22 1017  BP: (!) 81/45 97/63  Pulse: 71 72  Resp: 12 16  Temp: 36.4 C   SpO2: 97% 98%    Last Pain:  Vitals:   08/22/22 1013  TempSrc: Oral  PainSc: 0-No pain                 Brycen Bean C Galina Haddox

## 2022-08-22 NOTE — Op Note (Signed)
[facility] Patient Name: Colleen Sellers Procedure Date: 08/22/2022 9:26 AM MRN: [id number] Date of Birth: [birth year] Attending MD: Gennette Pac , MD, [id number] CSN: [id number] Age: 56 Admit Type: Outpatient Procedure:                Upper GI endoscopy Indications:              Dysphagia Providers:                Gennette Pac, MD, Sheran Fava, Angelica Ran Referring MD:              Medicines:                Propofol per Anesthesia Complications:            No immediate complications. Estimated Blood Loss:     Estimated blood loss was minimal. Procedure:                Pre-Anesthesia Assessment:                           - Prior to the procedure, a History and Physical                            was performed, and patient medications and                            allergies were reviewed. The patient's tolerance of                            previous anesthesia was also reviewed. The risks                            and benefits of the procedure and the sedation                            options and risks were discussed with the patient.                            All questions were answered, and informed consent                            was obtained. Prior Anticoagulants: The patient has                            taken no anticoagulant or antiplatelet agents. ASA                            Grade Assessment: III - A patient with severe                            systemic disease. After reviewing the risks and  benefits, the patient was deemed in satisfactory                            condition to undergo the procedure.                           After obtaining informed consent, the endoscope was                            passed under direct vision. Throughout the                            procedure, the patient's blood pressure, pulse, and                            oxygen saturations were monitored  continuously. The                            [id number]-H190 ([id number]) scope was introduced through the                            mouth, and advanced to the second part of duodenum.                            The upper GI endoscopy was accomplished without                            difficulty. The patient tolerated the procedure                            well. Scope In: 9:58:28 AM Scope Out: 10:09:48 AM Total Procedure Duration: 0 hours 11 minutes 20 seconds  Findings:      A non-obstructing Schatzki ring was found at the gastroesophageal       junction.      The entire examined stomach was normal.      The duodenal bulb and second portion of the duodenum were normal. The       scope was withdrawn. Dilation was performed with a Maloney dilator with       no resistance at 54 Fr. Dilation was performed with a Maloney dilator       with mild resistance at 56 Fr. The dilation site was examined following       endoscope reinsertion and showed no change. Estimated blood loss: none.       The scope was withdrawn. Dilation was performed with a Maloney dilator       with mild resistance at 58 Fr. The dilation site was examined following       endoscope reinsertion and showed moderate mucosal disruption. Estimated       blood loss was minimal. Impression:               - Non-obstructing Schatzki ring. Dilated.                           - Normal stomach.                           -  Normal duodenal bulb and second portion of the                            duodenum.                           - No specimens collected. Moderate Sedation:      Moderate (conscious) sedation was personally administered by an       anesthesia professional. The following parameters were monitored: oxygen       saturation, heart rate, blood pressure, respiratory rate, EKG, adequacy       of pulmonary ventilation, and response to care. Recommendation:           - Patient has a contact number available for                             emergencies. The signs and symptoms of potential                            delayed complications were discussed with the                            patient. Return to normal activities tomorrow.                            Written discharge instructions were provided to the                            patient.                           - Advance diet as tolerated. Begin Protonix 40 mg                            daily 30 minutes before breakfast. New prescription                            provided through the office. Office visit with [place]                            in 3 months Procedure Code(s):        --- Professional ---                           [id number], Esophagogastroduodenoscopy, flexible,                            transoral; diagnostic, including collection of                            specimen(s) by brushing or washing, when performed                            (separate procedure)  43450, Dilation of esophagus, by unguided sound or                            bougie, single or multiple passes Diagnosis Code(s):        --- Professional ---                           K22.2, Esophageal obstruction                           R13.10, Dysphagia, unspecified CPT copyright 2022 [employer] Association. All rights reserved. The codes documented in this report are preliminary and upon coder review may  be revised to meet current compliance requirements. [name]. [name], MD Gennette Pac, MD 08/22/2022 10:21:51 AM This report has been signed electronically. Number of Addenda: 0

## 2022-08-22 NOTE — Transfer of Care (Signed)
Immediate Anesthesia Transfer of Care Note  Patient: Colleen Sellers  Procedure(s) Performed: ESOPHAGOGASTRODUODENOSCOPY (EGD) WITH PROPOFOL MALONEY DILATION  Patient Location: PACU  Anesthesia Type:General  Level of Consciousness: awake, alert , and oriented  Airway & Oxygen Therapy: Patient Spontanous Breathing  Post-op Assessment: Report given to RN, Post -op Vital signs reviewed and stable, Patient moving all extremities X 4, and Patient able to stick tongue midline  Post vital signs: Reviewed  Last Vitals:  Vitals Value Taken Time  BP 99/45   Temp 97.6   Pulse 71   Resp 10   SpO2 97     Last Pain:  Vitals:   08/22/22 0953  TempSrc:   PainSc: 4       Patients Stated Pain Goal: 7 (08/22/22 0728)  Complications: No notable events documented.

## 2022-08-22 NOTE — Anesthesia Preprocedure Evaluation (Addendum)
Anesthesia Evaluation  Patient identified by MRN, date of birth, ID band Patient awake    Reviewed: Allergy & Precautions, H&P , NPO status , Patient's Chart, lab work & pertinent test results  Airway Mallampati: III  TM Distance: >3 FB Neck ROM: Full  Mouth opening: Limited Mouth Opening  Dental  (+) Dental Advisory Given Root canal:   Pulmonary shortness of breath and with exertion, asthma    Pulmonary exam normal breath sounds clear to auscultation       Cardiovascular Exercise Tolerance: Poor hypertension, Pt. on medications +CHF  Normal cardiovascular exam Rhythm:Regular Rate:Normal     Neuro/Psych  Headaches, Seizures - (last seizure episode - 7 months ago, stopped taking medications),  PSYCHIATRIC DISORDERS Anxiety Depression     Neuromuscular disease (multiple sclerosis)    GI/Hepatic Neg liver ROS,GERD (dysphagia)  Medicated and Controlled,,  Endo/Other  Hypothyroidism    Renal/GU Renal InsufficiencyRenal disease  negative genitourinary   Musculoskeletal  (+) Arthritis , Osteoarthritis,  Fibromyalgia -  Abdominal   Peds  (+) ADHD Hematology negative hematology ROS (+)   Anesthesia Other Findings   Reproductive/Obstetrics negative OB ROS                             Anesthesia Physical Anesthesia Plan  ASA: 3  Anesthesia Plan: General   Post-op Pain Management: Minimal or no pain anticipated   Induction: Intravenous  PONV Risk Score and Plan: 0 and Propofol infusion  Airway Management Planned: Nasal Cannula and Natural Airway  Additional Equipment:   Intra-op Plan:   Post-operative Plan:   Informed Consent: I have reviewed the patients History and Physical, chart, labs and discussed the procedure including the risks, benefits and alternatives for the proposed anesthesia with the patient or authorized representative who has indicated his/her understanding and  acceptance.     Dental advisory given  Plan Discussed with: CRNA and Surgeon  Anesthesia Plan Comments:        Anesthesia Quick Evaluation

## 2022-08-22 NOTE — Interval H&P Note (Signed)
History and Physical Interval Note:  08/22/2022 9:50 AM  Colleen Sellers  has presented today for surgery, with the diagnosis of dysphagia.  The various methods of treatment have been discussed with the patient and family. After consideration of risks, benefits and other options for treatment, the patient has consented to  Procedure(s) with comments: ESOPHAGOGASTRODUODENOSCOPY (EGD) WITH PROPOFOL (N/A) - 9:00am, asa 2 MALONEY DILATION (N/A) as a surgical intervention.  The patient's history has been reviewed, patient examined, no change in status, stable for surgery.  I have reviewed the patient's chart and labs.  Questions were answered to the patient's satisfaction.     [name]    Patient seen and examined.  Known Schatzki's ring.  I have offered her an EGD with esophageal dilation as feasible/appropriate today.The risks, benefits, limitations, alternatives and imponderables have been reviewed with the patient. Potential for esophageal dilation, biopsy, etc. have also been reviewed.  Questions have been answered. All parties agreeable.

## 2022-08-22 NOTE — Discharge Instructions (Signed)
EGD Discharge instructions Please read the instructions outlined below and refer to this sheet in the next few weeks. These discharge instructions provide you with general information on caring for yourself after you leave the hospital. Your doctor may also give you specific instructions. While your treatment has been planned according to the most current medical practices available, unavoidable complications occasionally occur. If you have any problems or questions after discharge, please call your doctor. ACTIVITY You may resume your regular activity but move at a slower pace for the next 24 hours.  Take frequent rest periods for the next 24 hours.  Walking will help expel (get rid of) the air and reduce the bloated feeling in your abdomen.  No driving for 24 hours (because of the anesthesia (medicine) used during the test).  You may shower.  Do not sign any important legal documents or operate any machinery for 24 hours (because of the anesthesia used during the test).  NUTRITION Drink plenty of fluids.  You may resume your normal diet.  Begin with a light meal and progress to your normal diet.  Avoid alcoholic beverages for 24 hours or as instructed by your caregiver.  MEDICATIONS You may resume your normal medications unless your caregiver tells you otherwise.  WHAT YOU CAN EXPECT TODAY You may experience abdominal discomfort such as a feeling of fullness or "gas" pains.  FOLLOW-UP Your doctor will discuss the results of your test with you.  SEEK IMMEDIATE MEDICAL ATTENTION IF ANY OF THE FOLLOWING OCCUR: Excessive nausea (feeling sick to your stomach) and/or vomiting.  Severe abdominal pain and distention (swelling).  Trouble swallowing.  Temperature over 101 F (37.8 C).  Rectal bleeding or vomiting of blood.    Your esophagus was stretched today  I recommend you begin a medicine for acid control.  Protonix 40 mg daily 30 minutes before breakfast.  A new prescription will be sent  to your pharmacy from my office.    Office visit with Tana [place] in 3 months   at patient request, I called Rosalita Levan at [phone] -

## 2022-08-23 ENCOUNTER — Telehealth: Payer: Self-pay

## 2022-08-23 MED ORDER — PANTOPRAZOLE SODIUM 40 MG PO TBEC: 40.0000 mg | DELAYED_RELEASE_TABLET | Freq: Every day | ORAL | 11 refills | Status: DC

## 2022-08-23 NOTE — Telephone Encounter (Signed)
Rx sent to pharmacy on file.

## 2022-08-23 NOTE — Telephone Encounter (Signed)
-----   Message from Corbin Ade, MD sent at 08/22/2022 10:11 AM EST ----- Regarding: New prescription.   Patient needs a new prescription called in: Protonix 40 mg 30 minutes before breakfast daily.  Dispense 30 with 11 refills.

## 2022-08-29 ENCOUNTER — Encounter (HOSPITAL_COMMUNITY): Payer: Self-pay | Admitting: Internal Medicine

## 2022-10-11 DIAGNOSIS — M47816 Spondylosis without myelopathy or radiculopathy, lumbar region: Secondary | ICD-10-CM | POA: Diagnosis not present

## 2022-10-11 DIAGNOSIS — G894 Chronic pain syndrome: Secondary | ICD-10-CM | POA: Diagnosis not present

## 2022-10-11 DIAGNOSIS — M797 Fibromyalgia: Secondary | ICD-10-CM | POA: Diagnosis not present

## 2022-10-11 DIAGNOSIS — Z79891 Long term (current) use of opiate analgesic: Secondary | ICD-10-CM | POA: Diagnosis not present

## 2022-10-11 DIAGNOSIS — M5136 Other intervertebral disc degeneration, lumbar region: Secondary | ICD-10-CM | POA: Diagnosis not present

## 2022-10-28 ENCOUNTER — Encounter: Payer: Self-pay | Admitting: Family Medicine

## 2022-10-28 ENCOUNTER — Ambulatory Visit: Payer: Medicaid Other | Admitting: Family Medicine

## 2022-10-28 VITALS — BP 137/82 | HR 68 | Temp 98.7°F | Ht 68.0 in | Wt 208.0 lb

## 2022-10-28 DIAGNOSIS — J3089 Other allergic rhinitis: Secondary | ICD-10-CM

## 2022-10-28 DIAGNOSIS — N182 Chronic kidney disease, stage 2 (mild): Secondary | ICD-10-CM

## 2022-10-28 DIAGNOSIS — E039 Hypothyroidism, unspecified: Secondary | ICD-10-CM | POA: Diagnosis not present

## 2022-10-28 DIAGNOSIS — I10 Essential (primary) hypertension: Secondary | ICD-10-CM

## 2022-10-28 DIAGNOSIS — Z6379 Other stressful life events affecting family and household: Secondary | ICD-10-CM | POA: Diagnosis not present

## 2022-10-28 MED ORDER — LOSARTAN POTASSIUM 50 MG PO TABS: 50.0000 mg | ORAL_TABLET | Freq: Every day | ORAL | 3 refills | Status: DC

## 2022-10-28 NOTE — Progress Notes (Signed)
Subjective: CC: Follow-up hypothyroidism PCP: Janora Norlander, DO [id number] Colleen Sellers is a 57 y.o. female presenting to clinic today for:  1.  Hypothyroidism Patient is compliant with Synthroid 75 mcg daily.  No reports of tremor, heart palpitations or changes in bowel habits.  She does report some sadness surrounding the illness of her dog Sadie.  She apparently suffering from some renal dysfunction and has been really ill lately.  2.  Allergic rhinitis Singulair was started last visit.  She notes that this has been controlling her allergies fairly well and is not causing any sedation.  She is happy with the new regimen  3.  Hypertension associated with CKD Patient compliant with Cozaar 50 mg daily.  No reports of changes in urine output   ROS: Per HPI  Allergies  Allergen Reactions   Ceftin Other (See Comments)    Unknown   Codeine Nausea And Vomiting   Morphine Hives and Itching    IV   Adhesive [Tape] Rash   Latex Rash   Past Medical History:  Diagnosis Date   Acid reflux    Anxiety    Arthritis    Asthma    Chest pain    Dyspnea, diaphoresis   CHF (congestive heart failure) (HCC)    Chronic back pain    Degenerative joint disease    Also scoliosis and pectus deformity   Depression    Fibromyalgia    Hyperlipidemia    IBS (irritable bowel syndrome)    Mild hematochezia; negative for celiac disease; mild Schatzki's ring; small hiatal hernia; minor gastric erosions; few diverticula   MS (multiple sclerosis) (HCC)    Overweight(278.02)    Negative sleep study-2010, but treated for narcolepsy   Seizure disorder ([place])    Surgical menopause 09/16/2000   age 58    Current Outpatient Medications:    amphetamine-dextroamphetamine (ADDERALL) 10 MG tablet, Take 10 mg by mouth 2 (two) times daily., Disp: , Rfl:    cholecalciferol (VITAMIN D3) 25 MCG (1000 UNIT) tablet, Take 1,000 Units by mouth daily., Disp: , Rfl:    cyanocobalamin (VITAMIN B12) 1000 MCG/ML  injection, Inject into the muscle., Disp: , Rfl:    fluticasone (FLONASE) 50 MCG/ACT nasal spray, Place 2 sprays into both nostrils daily. (Patient taking differently: Place 2 sprays into both nostrils daily as needed for allergies.), Disp: 16 g, Rfl: 6   gabapentin (NEURONTIN) 300 MG capsule, Take 300 mg by mouth 3 (three) times daily., Disp: , Rfl:    levothyroxine (SYNTHROID) 75 MCG tablet, Take 1 tablet (75 mcg total) by mouth daily., Disp: 90 tablet, Rfl: 3   losartan (COZAAR) 50 MG tablet, Take 1 tablet (50 mg total) by mouth daily. For blood pressure, Disp: 90 tablet, Rfl: 3   melatonin 5 MG TABS, Take 5 mg by mouth at bedtime., Disp: , Rfl:    montelukast (SINGULAIR) 10 MG tablet, Take 1 tablet (10 mg total) by mouth at bedtime., Disp: 90 tablet, Rfl: 3   oxyCODONE-acetaminophen (PERCOCET) 10-325 MG tablet, Take 1 tablet by mouth every 8 (eight) hours as needed for pain., Disp: , Rfl:    pantoprazole (PROTONIX) 40 MG tablet, Take 1 tablet (40 mg total) by mouth daily., Disp: 30 tablet, Rfl: 11   tiZANidine (ZANAFLEX) 4 MG tablet, Take 4 mg by mouth at bedtime., Disp: , Rfl:    XTAMPZA ER 13.5 MG C12A, Take 13.5 mg by mouth 2 (two) times daily., Disp: , Rfl:  Social History  Socioeconomic History   Marital status: Divorced    Spouse name: Not on file   Number of children: 4   Years of education: Not on file   Highest education level: Not on file  Occupational History   Occupation: Disabled    Comment: Fibromyalgia, arthritis  Tobacco Use   Smoking status: Never   Smokeless tobacco: Never  Vaping Use   Vaping Use: Never used  Substance and Sexual Activity   Alcohol use: No    Alcohol/week: 0.0 standard drinks of alcohol    Comment: minimal use   Drug use: No   Sexual activity: Yes  Other Topics Concern   Not on file  Social History Narrative   Expecting a granddaughter in march 2024.   Social Determinants of Health   Financial Resource Strain: Not on file  Food  Insecurity: Not on file  Transportation Needs: Not on file  Physical Activity: Not on file  Stress: Not on file  Social Connections: Not on file  Intimate Partner Violence: Not on file   Family History  Problem Relation Age of Onset   Depression Mother        suicide   Alcohol abuse Father    Coronary artery disease Father    Colon cancer Maternal Grandfather     Objective: Office vital signs reviewed. BP 137/82   Pulse 68   Temp 98.7 F (37.1 C)   Ht 5' 8"$  (1.727 m)   Wt 208 lb (94.3 kg)   LMP 09/16/1994   SpO2 96%   BMI 31.63 kg/m   Physical Examination:  General: Awake, alert, well nourished, No acute distress HEENT: Sclera injected.  Moist mucous membranes Cardio: regular rate and rhythm, S1S2 heard, no murmurs appreciated Pulm: clear to auscultation bilaterally, no wheezes, rhonchi or rales; normal work of breathing on room air Psych: Tearful when talking about her dog    Assessment/ Plan: 57 y.o. female   Acquired hypothyroidism - Plan: TSH, T4, Free  Stage 2 chronic kidney disease - Plan: Renal Function Panel  Stress due to illness of family member  Seasonal allergic rhinitis due to other allergic trigger  Essential hypertension - Plan: losartan (COZAAR) 50 MG tablet  Check thyroid levels, renal function.    She understandably has some sadness surrounding the illness of her dog.  She knows that she can contact me if needing Atarax for nerves.  Singulair is working well for allergies.  Not sedating.  Continue current regimen  Blood pressure is controlled.  No changes.  Losartan renewed  No orders of the defined types were placed in this encounter.  No orders of the defined types were placed in this encounter.    Janora Norlander, DO [place] [phone]

## 2022-10-29 LAB — RENAL FUNCTION PANEL
Albumin: 4.6 g/dL (ref 3.8–4.9)
BUN/Creatinine Ratio: 9 (ref 9–23)
BUN: 10 mg/dL (ref 6–24)
CO2: 24 mmol/L (ref 20–29)
Calcium: 9.7 mg/dL (ref 8.7–10.2)
Chloride: 102 mmol/L (ref 96–106)
Creatinine, Ser: 1.14 mg/dL — ABNORMAL HIGH (ref 0.57–1.00)
Glucose: 94 mg/dL (ref 70–99)
Phosphorus: 4 mg/dL (ref 3.0–4.3)
Potassium: 4.4 mmol/L (ref 3.5–5.2)
Sodium: 140 mmol/L (ref 134–144)
eGFR: 56 mL/min/{1.73_m2} — ABNORMAL LOW (ref >59)

## 2022-10-29 LAB — TSH: TSH: 4 u[IU]/mL (ref 0.450–4.500)

## 2022-10-29 LAB — T4, FREE: Free T4: 1.16 ng/dL (ref 0.82–1.77)

## 2022-11-22 ENCOUNTER — Ambulatory Visit: Payer: Medicaid Other | Admitting: Gastroenterology

## 2022-12-06 ENCOUNTER — Ambulatory Visit: Payer: Medicaid Other | Admitting: Gastroenterology

## 2022-12-18 ENCOUNTER — Ambulatory Visit: Payer: Medicaid Other | Admitting: Gastroenterology

## 2022-12-18 NOTE — Progress Notes (Deleted)
GI Office Note    Referring Provider: Janora Norlander, DO Primary Care Physician:  Janora Norlander, DO  Primary Gastroenterologist: Garfield Cornea, MD   Chief Complaint   No chief complaint on file.   History of Present Illness   Colleen Sellers is a 57 y.o. female presenting today for follow up EGD. Seen in 07/2022 for dysphagia, constipation.    EGD 08/2022: -non-obstructing Schatzki ring s/p dilation  Colonoscopy 2010: -friable anal canal -scattered pancolonic diverticula -normal TI   Medications   Current Outpatient Medications  Medication Sig Dispense Refill   amphetamine-dextroamphetamine (ADDERALL) 10 MG tablet Take 10 mg by mouth 2 (two) times daily.     cholecalciferol (VITAMIN D3) 25 MCG (1000 UNIT) tablet Take 1,000 Units by mouth daily.     cyanocobalamin (VITAMIN B12) 1000 MCG/ML injection Inject into the muscle.     fluticasone (FLONASE) 50 MCG/ACT nasal spray Place 2 sprays into both nostrils daily. (Patient taking differently: Place 2 sprays into both nostrils daily as needed for allergies.) 16 g 6   gabapentin (NEURONTIN) 300 MG capsule Take 300 mg by mouth 3 (three) times daily.     levothyroxine (SYNTHROID) 75 MCG tablet Take 1 tablet (75 mcg total) by mouth daily. 90 tablet 3   losartan (COZAAR) 50 MG tablet Take 1 tablet (50 mg total) by mouth daily. For blood pressure 90 tablet 3   melatonin 5 MG TABS Take 5 mg by mouth at bedtime.     montelukast (SINGULAIR) 10 MG tablet Take 1 tablet (10 mg total) by mouth at bedtime. 90 tablet 3   oxyCODONE-acetaminophen (PERCOCET) 10-325 MG tablet Take 1 tablet by mouth every 8 (eight) hours as needed for pain.     pantoprazole (PROTONIX) 40 MG tablet Take 1 tablet (40 mg total) by mouth daily. 30 tablet 11   tiZANidine (ZANAFLEX) 4 MG tablet Take 4 mg by mouth at bedtime.     XTAMPZA ER 13.5 MG C12A Take 13.5 mg by mouth 2 (two) times daily.     No current facility-administered medications for this  visit.    Allergies   Allergies as of 12/18/2022 - Review Complete 10/28/2022  Allergen Reaction Noted   Ceftin Other (See Comments) 10/15/2011   Codeine Nausea And Vomiting 06/15/2010   Morphine Hives and Itching    Adhesive [tape] Rash 06/12/2014   Latex Rash 06/12/2014     Past Medical History   Past Medical History:  Diagnosis Date   Acid reflux    Anxiety    Arthritis    Asthma    Chest pain    Dyspnea, diaphoresis   CHF (congestive heart failure) (HCC)    Chronic back pain    Degenerative joint disease    Also scoliosis and pectus deformity   Depression    Fibromyalgia    Hyperlipidemia    IBS (irritable bowel syndrome)    Mild hematochezia; negative for celiac disease; mild Schatzki's ring; small hiatal hernia; minor gastric erosions; few diverticula   MS (multiple sclerosis) (HCC)    Overweight(278.02)    Negative sleep study-2010, but treated for narcolepsy   Seizure disorder [facility])    Surgical menopause 09/16/2000   age 16    Past Surgical History   Past Surgical History:  Procedure Laterality Date   BRAIN SURGERY     CHOLECYSTECTOMY     COLONOSCOPY  2001, 2010   negative/friable anal canal scattered pancolonic diverticula   ESOPHAGOGASTRODUODENOSCOPY  11/22/08  noncritical Schatzi ring/small hiatal herina/tiny antral reosions   ESOPHAGOGASTRODUODENOSCOPY (EGD) WITH PROPOFOL N/A 08/22/2022   Procedure: ESOPHAGOGASTRODUODENOSCOPY (EGD) WITH PROPOFOL;  Surgeon: Daneil Dolin, MD;  [facility];  Service: Endoscopy;  Laterality: N/A;  9:00am, asa 2   MALONEY DILATION N/A 08/22/2022   Procedure: MALONEY DILATION;  Surgeon: Daneil Dolin, MD;  [facility];  Service: Endoscopy;  Laterality: N/A;   RHINOPLASTY     Deviated septum   SALPINGOOPHORECTOMY  2002   Bilateral   TUBAL LIGATION     VAGINAL HYSTERECTOMY  1996    Past Family History   Family History  Problem Relation Age of Onset   Depression Mother        suicide    Alcohol abuse Father    Coronary artery disease Father    Colon cancer Maternal Grandfather     Past Social History   Social History   Socioeconomic History   Marital status: Divorced    Spouse name: Not on file   Number of children: 4   Years of education: Not on file   Highest education level: Not on file  Occupational History   Occupation: Disabled    Comment: Fibromyalgia, arthritis  Tobacco Use   Smoking status: Never   Smokeless tobacco: Never  Vaping Use   Vaping Use: Never used  Substance and Sexual Activity   Alcohol use: No    Alcohol/week: 0.0 standard drinks of alcohol    Comment: minimal use   Drug use: No   Sexual activity: Yes  Other Topics Concern   Not on file  Social History Narrative   Expecting a granddaughter in march 2024.   Social Determinants of Health   Financial Resource Strain: Not on file  Food Insecurity: Not on file  Transportation Needs: Not on file  Physical Activity: Not on file  Stress: Not on file  Social Connections: Not on file  Intimate Partner Violence: Not on file    Review of Systems   General: Negative for anorexia, weight loss, fever, chills, fatigue, weakness. ENT: Negative for hoarseness, difficulty swallowing , nasal congestion. CV: Negative for chest pain, angina, palpitations, dyspnea on exertion, peripheral edema.  Respiratory: Negative for dyspnea at rest, dyspnea on exertion, cough, sputum, wheezing.  GI: See history of present illness. GU:  Negative for dysuria, hematuria, urinary incontinence, urinary frequency, nocturnal urination.  Endo: Negative for unusual weight change.     Physical Exam   LMP 09/16/1994    General: Well-nourished, well-developed in no acute distress.  Eyes: No icterus. Mouth: Oropharyngeal mucosa moist and pink , no lesions erythema or exudate. Lungs: Clear to auscultation bilaterally.  Heart: Regular rate and rhythm, no murmurs rubs or gallops.  Abdomen: Bowel sounds are  normal, nontender, nondistended, no hepatosplenomegaly or masses,  no abdominal bruits or hernia , no rebound or guarding.  Rectal: ***  Extremities: No lower extremity edema. No clubbing or deformities. Neuro: Alert and oriented x 4   Skin: Warm and dry, no jaundice.   Psych: Alert and cooperative, normal mood and affect.  Labs   *** Imaging Studies   No results found.  Assessment       PLAN   ***   Laureen Ochs. Bobby Rumpf, [place], [facility]

## 2022-12-30 ENCOUNTER — Ambulatory Visit: Payer: Medicaid Other | Admitting: Gastroenterology

## 2022-12-30 ENCOUNTER — Encounter: Payer: Self-pay | Admitting: Gastroenterology

## 2022-12-30 NOTE — Progress Notes (Deleted)
GI Office Note    Referring Provider: Raliegh Ip, DO Primary Care Physician:  Raliegh Ip, DO  Primary Gastroenterologist: Roetta Sessions, MD   Chief Complaint   No chief complaint on file.   History of Present Illness   Colleen Sellers is a 57 y.o. female presenting today for follow-up.  Seen in the office back in November 2023.  Complaints of dysphagia at that time.  Also with chronic opioid-induced constipation.   EGD completed December 2023: -Nonobstructive Schatzki ring status post dilation     Medications   Current Outpatient Medications  Medication Sig Dispense Refill   amphetamine-dextroamphetamine (ADDERALL) 10 MG tablet Take 10 mg by mouth 2 (two) times daily.     cholecalciferol (VITAMIN D3) 25 MCG (1000 UNIT) tablet Take 1,000 Units by mouth daily.     cyanocobalamin (VITAMIN B12) 1000 MCG/ML injection Inject into the muscle.     fluticasone (FLONASE) 50 MCG/ACT nasal spray Place 2 sprays into both nostrils daily. (Patient taking differently: Place 2 sprays into both nostrils daily as needed for allergies.) 16 g 6   gabapentin (NEURONTIN) 300 MG capsule Take 300 mg by mouth 3 (three) times daily.     levothyroxine (SYNTHROID) 75 MCG tablet Take 1 tablet (75 mcg total) by mouth daily. 90 tablet 3   losartan (COZAAR) 50 MG tablet Take 1 tablet (50 mg total) by mouth daily. For blood pressure 90 tablet 3   melatonin 5 MG TABS Take 5 mg by mouth at bedtime.     montelukast (SINGULAIR) 10 MG tablet Take 1 tablet (10 mg total) by mouth at bedtime. 90 tablet 3   oxyCODONE-acetaminophen (PERCOCET) 10-325 MG tablet Take 1 tablet by mouth every 8 (eight) hours as needed for pain.     pantoprazole (PROTONIX) 40 MG tablet Take 1 tablet (40 mg total) by mouth daily. 30 tablet 11   tiZANidine (ZANAFLEX) 4 MG tablet Take 4 mg by mouth at bedtime.     XTAMPZA ER 13.5 MG C12A Take 13.5 mg by mouth 2 (two) times daily.     No current facility-administered  medications for this visit.    Allergies   Allergies as of 12/30/2022 - Review Complete 10/28/2022  Allergen Reaction Noted   Ceftin Other (See Comments) 10/15/2011   Codeine Nausea And Vomiting 06/15/2010   Morphine Hives and Itching    Adhesive [tape] Rash 06/12/2014   Latex Rash 06/12/2014     Past Medical History   Past Medical History:  Diagnosis Date   Acid reflux    Anxiety    Arthritis    Asthma    Chest pain    Dyspnea, diaphoresis   CHF (congestive heart failure) (HCC)    Chronic back pain    Degenerative joint disease    Also scoliosis and pectus deformity   Depression    Fibromyalgia    Hyperlipidemia    IBS (irritable bowel syndrome)    Mild hematochezia; negative for celiac disease; mild Schatzki's ring; small hiatal hernia; minor gastric erosions; few diverticula   MS (multiple sclerosis) (HCC)    Overweight(278.02)    Negative sleep study-2010, but treated for narcolepsy   Seizure disorder [facility])    Surgical menopause 09/16/2000   age 57    Past Surgical History   Past Surgical History:  Procedure Laterality Date   BRAIN SURGERY     CHOLECYSTECTOMY     COLONOSCOPY  2001, 2010   negative/friable anal canal scattered pancolonic  diverticula   ESOPHAGOGASTRODUODENOSCOPY  11/22/08   noncritical Schatzi ring/small hiatal herina/tiny antral reosions   ESOPHAGOGASTRODUODENOSCOPY (EGD) WITH PROPOFOL N/A 08/22/2022   Procedure: ESOPHAGOGASTRODUODENOSCOPY (EGD) WITH PROPOFOL;  Surgeon: Corbin Ade, MD;  [facility];  Service: Endoscopy;  Laterality: N/A;  9:00am, asa 2   MALONEY DILATION N/A 08/22/2022   Procedure: MALONEY DILATION;  Surgeon: Corbin Ade, MD;  [facility];  Service: Endoscopy;  Laterality: N/A;   RHINOPLASTY     Deviated septum   SALPINGOOPHORECTOMY  2002   Bilateral   TUBAL LIGATION     VAGINAL HYSTERECTOMY  1996    Past Family History   Family History  Problem Relation Age of Onset   Depression  Mother        suicide   Alcohol abuse Father    Coronary artery disease Father    Colon cancer Maternal Grandfather     Past Social History   Social History   Socioeconomic History   Marital status: Divorced    Spouse name: Not on file   Number of children: 4   Years of education: Not on file   Highest education level: Not on file  Occupational History   Occupation: Disabled    Comment: Fibromyalgia, arthritis  Tobacco Use   Smoking status: Never   Smokeless tobacco: Never  Vaping Use   Vaping Use: Never used  Substance and Sexual Activity   Alcohol use: No    Alcohol/week: 0.0 standard drinks of alcohol    Comment: minimal use   Drug use: No   Sexual activity: Yes  Other Topics Concern   Not on file  Social History Narrative   Expecting a granddaughter in march 2024.   Social Determinants of Health   Financial Resource Strain: Not on file  Food Insecurity: Not on file  Transportation Needs: Not on file  Physical Activity: Not on file  Stress: Not on file  Social Connections: Not on file  Intimate Partner Violence: Not on file    Review of Systems   General: Negative for anorexia, weight loss, fever, chills, fatigue, weakness. ENT: Negative for hoarseness, difficulty swallowing , nasal congestion. CV: Negative for chest pain, angina, palpitations, dyspnea on exertion, peripheral edema.  Respiratory: Negative for dyspnea at rest, dyspnea on exertion, cough, sputum, wheezing.  GI: See history of present illness. GU:  Negative for dysuria, hematuria, urinary incontinence, urinary frequency, nocturnal urination.  Endo: Negative for unusual weight change.     Physical Exam   LMP 09/16/1994    General: Well-nourished, well-developed in no acute distress.  Eyes: No icterus. Mouth: Oropharyngeal mucosa moist and pink , no lesions erythema or exudate. Lungs: Clear to auscultation bilaterally.  Heart: Regular rate and rhythm, no murmurs rubs or gallops.   Abdomen: Bowel sounds are normal, nontender, nondistended, no hepatosplenomegaly or masses,  no abdominal bruits or hernia , no rebound or guarding.  Rectal: ***  Extremities: No lower extremity edema. No clubbing or deformities. Neuro: Alert and oriented x 4   Skin: Warm and dry, no jaundice.   Psych: Alert and cooperative, normal mood and affect.  Labs   Lab Results  Component Value Date   TSH 4.000 10/28/2022   Lab Results  Component Value Date   CREATININE 1.14 (H) 10/28/2022   BUN 10 10/28/2022   NA 140 10/28/2022   K 4.4 10/28/2022   CL 102 10/28/2022   CO2 24 10/28/2022    Imaging Studies   No  results found.  Assessment       PLAN   ***   Laureen Ochs. Bobby Rumpf, [place], [facility]

## 2023-01-10 DIAGNOSIS — M7918 Myalgia, other site: Secondary | ICD-10-CM | POA: Diagnosis not present

## 2023-01-10 DIAGNOSIS — G894 Chronic pain syndrome: Secondary | ICD-10-CM | POA: Diagnosis not present

## 2023-01-10 DIAGNOSIS — M47816 Spondylosis without myelopathy or radiculopathy, lumbar region: Secondary | ICD-10-CM | POA: Diagnosis not present

## 2023-01-10 DIAGNOSIS — M5136 Other intervertebral disc degeneration, lumbar region: Secondary | ICD-10-CM | POA: Diagnosis not present

## 2023-01-10 DIAGNOSIS — M797 Fibromyalgia: Secondary | ICD-10-CM | POA: Diagnosis not present

## 2023-01-10 DIAGNOSIS — Z79891 Long term (current) use of opiate analgesic: Secondary | ICD-10-CM | POA: Diagnosis not present

## 2023-02-26 ENCOUNTER — Encounter: Payer: Self-pay | Admitting: Family Medicine

## 2023-02-26 ENCOUNTER — Ambulatory Visit: Payer: Medicaid Other | Admitting: Family Medicine

## 2023-02-26 VITALS — BP 158/96 | HR 73 | Temp 98.6°F | Ht 68.0 in | Wt 207.0 lb

## 2023-02-26 DIAGNOSIS — E039 Hypothyroidism, unspecified: Secondary | ICD-10-CM | POA: Diagnosis not present

## 2023-02-26 DIAGNOSIS — N8110 Cystocele, unspecified: Secondary | ICD-10-CM | POA: Diagnosis not present

## 2023-02-26 DIAGNOSIS — F329 Major depressive disorder, single episode, unspecified: Secondary | ICD-10-CM | POA: Diagnosis not present

## 2023-02-26 DIAGNOSIS — G47429 Narcolepsy in conditions classified elsewhere without cataplexy: Secondary | ICD-10-CM | POA: Diagnosis not present

## 2023-02-26 DIAGNOSIS — G35 Multiple sclerosis: Secondary | ICD-10-CM

## 2023-02-26 DIAGNOSIS — I10 Essential (primary) hypertension: Secondary | ICD-10-CM | POA: Diagnosis not present

## 2023-02-26 MED ORDER — LOSARTAN POTASSIUM 50 MG PO TABS: 50.0000 mg | ORAL_TABLET | Freq: Two times a day (BID) | ORAL | 3 refills | Status: DC

## 2023-02-26 MED ORDER — AMPHETAMINE-DEXTROAMPHETAMINE 10 MG PO TABS: 10.0000 mg | ORAL_TABLET | Freq: Two times a day (BID) | ORAL | 0 refills | Status: DC

## 2023-02-26 NOTE — Progress Notes (Signed)
Subjective: CC: Hypothyroidism PCP: Raliegh Ip, DO Colleen Sellers is a 57 y.o. female presenting to clinic today for:  1.  Hypothyroidism Patient reports her hair has been thinning as of late.  She does report some increased depression as well this seems reactive because her dog Sadie passed away.  She has been trying to acclimate to life since her passing in April.  Her other dog also seems depressed now.  She has chronic constipation but this is alleviated by diet.  She is chronically treated with opioid  2.  Vaginal prolapse Patient reports that she has been feeling a protrusion in her vagina.  She used to be under the care of Dr. Emelda Fear but he has retired.  Needs new provider.  Does not report any vaginal pain, discharge or bleeding  3.  Hypertension Patient reports compliance with losartan 50 mg daily.  At her last visit she saw her cardiologist and they told her that she may need to go up to twice daily dosing because her blood pressure was uncontrolled at that visit as well.  She does not report any chest pain, shortness of breath or blurred vision  4.  Narcolepsy, multiple sclerosis Patient was under the care of Dr. Gerilyn Pilgrim in [place] but he has since retired.  She needs a referral to new neurologist.  She has about 2 weeks of her Adderall left and is asking for an extension while she waits to establish care.  She notes that she takes this for narcolepsy and for attention but he was primarily prescribing it for sleep disorder.  She was previously under the care of [facility] and she is glad to get back there.  Prefers a female provider Of note she also reports that she will be started on Savella today.  She has been continued on all other medications as prescribed   ROS: Per HPI  Allergies  Allergen Reactions   Ceftin Other (See Comments)    Unknown   Codeine Nausea And Vomiting   Morphine Hives and Itching    IV   Adhesive [Tape] Rash   Latex Rash   Past Medical  History:  Diagnosis Date   Acid reflux    Anxiety    Arthritis    Asthma    Chest pain    Dyspnea, diaphoresis   CHF (congestive heart failure) (HCC)    Chronic back pain    Degenerative joint disease    Also scoliosis and pectus deformity   Depression    Fibromyalgia    Hyperlipidemia    IBS (irritable bowel syndrome)    Mild hematochezia; negative for celiac disease; mild Schatzki's ring; small hiatal hernia; minor gastric erosions; few diverticula   MS (multiple sclerosis) (HCC)    Overweight(278.02)    Negative sleep study-2010, but treated for narcolepsy   Seizure disorder (HCC)    Surgical menopause 09/16/2000   age 36    Current Outpatient Medications:    amphetamine-dextroamphetamine (ADDERALL) 10 MG tablet, Take 10 mg by mouth 2 (two) times daily., Disp: , Rfl:    cholecalciferol (VITAMIN D3) 25 MCG (1000 UNIT) tablet, Take 1,000 Units by mouth daily., Disp: , Rfl:    cyanocobalamin (VITAMIN B12) 1000 MCG/ML injection, Inject into the muscle., Disp: , Rfl:    fluticasone (FLONASE) 50 MCG/ACT nasal spray, Place 2 sprays into both nostrils daily. (Patient taking differently: Place 2 sprays into both nostrils daily as needed for allergies.), Disp: 16 g, Rfl: 6   gabapentin (NEURONTIN)  300 MG capsule, Take 300 mg by mouth 3 (three) times daily., Disp: , Rfl:    levothyroxine (SYNTHROID) 75 MCG tablet, Take 1 tablet (75 mcg total) by mouth daily., Disp: 90 tablet, Rfl: 3   losartan (COZAAR) 50 MG tablet, Take 1 tablet (50 mg total) by mouth daily. For blood pressure, Disp: 90 tablet, Rfl: 3   melatonin 5 MG TABS, Take 5 mg by mouth at bedtime., Disp: , Rfl:    montelukast (SINGULAIR) 10 MG tablet, Take 1 tablet (10 mg total) by mouth at bedtime., Disp: 90 tablet, Rfl: 3   oxyCODONE-acetaminophen (PERCOCET) 10-325 MG tablet, Take 1 tablet by mouth every 8 (eight) hours as needed for pain., Disp: , Rfl:    pantoprazole (PROTONIX) 40 MG tablet, Take 1 tablet (40 mg total) by  mouth daily., Disp: 30 tablet, Rfl: 11   tiZANidine (ZANAFLEX) 4 MG tablet, Take 4 mg by mouth at bedtime., Disp: , Rfl:    XTAMPZA ER 13.5 MG C12A, Take 13.5 mg by mouth 2 (two) times daily., Disp: , Rfl:  Social History   Socioeconomic History   Marital status: Divorced    Spouse name: Not on file   Number of children: 4   Years of education: Not on file   Highest education level: Not on file  Occupational History   Occupation: Disabled    Comment: Fibromyalgia, arthritis  Tobacco Use   Smoking status: Never   Smokeless tobacco: Never  Vaping Use   Vaping Use: Never used  Substance and Sexual Activity   Alcohol use: No    Alcohol/week: 0.0 standard drinks of alcohol    Comment: minimal use   Drug use: No   Sexual activity: Yes  Other Topics Concern   Not on file  Social History Narrative   Expecting a granddaughter in march 2024.   Social Determinants of Health   Financial Resource Strain: Not on file  Food Insecurity: Not on file  Transportation Needs: Not on file  Physical Activity: Not on file  Stress: Not on file  Social Connections: Not on file  Intimate Partner Violence: Not on file   Family History  Problem Relation Age of Onset   Depression Mother        suicide   Alcohol abuse Father    Coronary artery disease Father    Colon cancer Maternal Grandfather     Objective: Office vital signs reviewed. BP (!) 147/95   Pulse 73   Temp 98.6 F (37 C)   Ht 5\' 8"  (1.727 m)   Wt 207 lb (93.9 kg)   LMP 09/16/1994   SpO2 94%   BMI 31.47 kg/m   Physical Examination:  General: Awake, alert, well nourished, No acute distress HEENT: sclera white, MMM Cardio: regular rate and rhythm, S1S2 heard, no murmurs appreciated Pulm: clear to auscultation bilaterally, no wheezes, rhonchi or rales; normal work of breathing on room air MSK: ambulating independently.  Neuro: Follows commands.  Speech normal  Assessment/ Plan: 57 y.o. female   Acquired  hypothyroidism - Plan: CMP14+EGFR, CBC, TSH, T4, Free  Multiple sclerosis (HCC) - Plan: amphetamine-dextroamphetamine (ADDERALL) 10 MG tablet, amphetamine-dextroamphetamine (ADDERALL) 10 MG tablet, Ambulatory referral to Neurology, CBC  Narcolepsy due to underlying condition without cataplexy - Plan: amphetamine-dextroamphetamine (ADDERALL) 10 MG tablet, amphetamine-dextroamphetamine (ADDERALL) 10 MG tablet, Ambulatory referral to Neurology  Reactive depression  Essential hypertension - Plan: losartan (COZAAR) 50 MG tablet  Vaginal prolapse - Plan: Ambulatory referral to Obstetrics / Gynecology  Check  labs given reports of hair thinning.  I have referred her to neurology at [facility] per her request.  Adderall has been bridged until she can establish care there.  Unfortunately I did not receive any notes from her neurologist so I suspect this is being in fact prescribed for narcolepsy but I do not have records.  The national narcotic database reviewed and there were no red flags.  She is being prescribed appropriately by her specialist who manages her chronic pain issues  She certainly has an elevated PHQ score here.  I suspect this is reactive.  She is starting Savella which should have some antidepressant effect.  I will follow-up with her closely in 4 months to ensure that she is improving and not needing additional interventions  Her blood pressure is not controlled.  Advance losartan to 50 mg twice daily.  Monitor blood pressures.  Log provided.  Would like blood pressure recheck with nurse in a couple of weeks  [facility]  We discussed that Dr. Emelda Fear is retired but there are 2 other providers that are available.  No orders of the defined types were placed in this encounter.  No orders of the defined types were placed in this encounter.    Raliegh Ip, DO [facility] [phone]

## 2023-02-27 LAB — CMP14+EGFR
ALT: 12 IU/L (ref 0–32)
AST: 21 IU/L (ref 0–40)
Albumin/Globulin Ratio: 1.6
Albumin: 4.6 g/dL (ref 3.8–4.9)
Alkaline Phosphatase: 100 IU/L (ref 44–121)
BUN/Creatinine Ratio: 9 (ref 9–23)
BUN: 11 mg/dL (ref 6–24)
Bilirubin Total: 0.4 mg/dL (ref 0.0–1.2)
CO2: 23 mmol/L (ref 20–29)
Calcium: 9.9 mg/dL (ref 8.7–10.2)
Chloride: 102 mmol/L (ref 96–106)
Creatinine, Ser: 1.2 mg/dL — ABNORMAL HIGH (ref 0.57–1.00)
Globulin, Total: 2.9 g/dL (ref 1.5–4.5)
Glucose: 102 mg/dL — ABNORMAL HIGH (ref 70–99)
Potassium: 4.4 mmol/L (ref 3.5–5.2)
Sodium: 139 mmol/L (ref 134–144)
Total Protein: 7.5 g/dL (ref 6.0–8.5)
eGFR: 52 mL/min/{1.73_m2} — ABNORMAL LOW (ref >59)

## 2023-02-27 LAB — CBC
Hematocrit: 40 % (ref 34.0–46.6)
Hemoglobin: 13.3 g/dL (ref 11.1–15.9)
MCH: 30.4 pg (ref 26.6–33.0)
MCHC: 33.3 g/dL (ref 31.5–35.7)
MCV: 91 fL (ref 79–97)
Platelets: 394 10*3/uL (ref 150–450)
RBC: 4.38 x10E6/uL (ref 3.77–5.28)
RDW: 11.8 % (ref 11.7–15.4)
WBC: 8.7 10*3/uL (ref 3.4–10.8)

## 2023-02-27 LAB — TSH: TSH: 3.19 u[IU]/mL (ref 0.450–4.500)

## 2023-02-27 LAB — T4, FREE: Free T4: 1.34 ng/dL (ref 0.82–1.77)

## 2023-03-12 ENCOUNTER — Encounter: Payer: Self-pay | Admitting: Adult Health

## 2023-03-12 ENCOUNTER — Ambulatory Visit: Payer: Medicaid Other | Admitting: Adult Health

## 2023-03-12 VITALS — BP 139/89 | HR 86 | Ht 68.0 in | Wt 209.0 lb

## 2023-03-12 DIAGNOSIS — Z90710 Acquired absence of both cervix and uterus: Secondary | ICD-10-CM | POA: Insufficient documentation

## 2023-03-12 DIAGNOSIS — N8110 Cystocele, unspecified: Secondary | ICD-10-CM | POA: Diagnosis not present

## 2023-03-12 DIAGNOSIS — F331 Major depressive disorder, recurrent, moderate: Secondary | ICD-10-CM | POA: Diagnosis not present

## 2023-03-12 DIAGNOSIS — N816 Rectocele: Secondary | ICD-10-CM | POA: Diagnosis not present

## 2023-03-12 NOTE — Progress Notes (Signed)
Subjective:     Patient ID: Colleen Sellers, female   DOB: 1965-10-05, 57 y.o.   MRN: [id number]  HPI Colleen Sellers is a 57 year old white female, divorced, sp hysterectomy, in for ?prolapse, she feels something in vagina and can't pee at times, feels like needs to move something out of the way. Has had rectocele in past.     Component Value Date/Time   DIAGPAP  06/17/2022 1554    - Negative for intraepithelial lesion or malignancy (NILM)   HPVHIGH Negative 06/17/2022 1554   ADEQPAP Satisfactory for evaluation. 06/17/2022 1554   PCP is Dr Nadine Counts  Review of Systems ?prolapse, she feels something in vagina and can't pee at times, feels like needs to move something out of the way. Has had rectocele in past. She is not having sex Reviewed past medical,surgical, social and family history. Reviewed medications and allergies.     Objective:   Physical Exam BP 139/89 (BP Location: Right Arm, Patient Position: Sitting, Cuff Size: Normal)   Pulse 86   Ht 5\' 8"  (1.727 m)   Wt 209 lb (94.8 kg)   LMP 09/16/1994   BMI 31.78 kg/m     Skin warm and dry.Pelvic: external genitalia is normal in appearance no lesions, vagina: pale, +cystocele,urethra has no lesions or masses noted, cervix and uterus are absent, adnexa: no masses or tenderness noted. Bladder is non tender and no masses felt, on rectal exam, has low mild rectocele.  AA is 0 Fall risk is low    03/12/2023    3:12 PM 10/28/2022    4:07 PM 06/17/2022    4:04 PM  Depression screen PHQ 2/9  Decreased Interest 3 0 1  Down, Depressed, Hopeless 1  0  PHQ - 2 Score 4 0 1  Altered sleeping 1  3  Tired, decreased energy 3 0 3  Change in appetite 3 0 0  Feeling bad or failure about yourself  0 0 0  Trouble concentrating 2 0 0  Moving slowly or fidgety/restless 0 0 2  Suicidal thoughts 0 0 0  PHQ-9 Score 13  9  Difficult doing work/chores  Not difficult at all Somewhat difficult       03/12/2023    3:12 PM 10/28/2022    4:06 PM 06/17/2022     4:04 PM 06/17/2022    3:54 PM  GAD 7 : Generalized Anxiety Score  Nervous, Anxious, on Edge 0 1 1 0  Control/stop worrying 0 0 1 0  Worry too much - different things 0 0 0 0  Trouble relaxing 0 0 0 0  Restless 0 0 0 0  Easily annoyed or irritable 0 0 1 0  Afraid - awful might happen 0 1 0 0  Total GAD 7 Score 0 2 3 0  Anxiety Difficulty  Somewhat difficult Somewhat difficult Not difficult at all      Upstream - 03/12/23 1513       Pregnancy Intention Screening   Does the patient want to become pregnant in the next year? N/A    Does the patient's partner want to become pregnant in the next year? N/A    Would the patient like to discuss contraceptive options today? N/A      Contraception Wrap Up   Current Method Female Sterilization   hysterectomy   End Method Female Sterilization   hysterectomy   Contraception Counseling Provided No            Examination chaperoned by [employer]  Dones CMA  Assessment:     1. Cystocele with rectocele Showed medical explainer #4, showing cystocele and rectocele Discussed pessary as a option Review print out on pessary  2. S/P hysterectomy  3. Moderate episode of recurrent major depressive disorder (HCC) She declines meds or counseling at this time, she says this is chronic and recurrent     Plan:     Return for pessary fitting with Dr Despina Hidden 04/03/23

## 2023-04-03 ENCOUNTER — Encounter: Payer: Medicaid Other | Admitting: Obstetrics & Gynecology

## 2023-04-11 DIAGNOSIS — G894 Chronic pain syndrome: Secondary | ICD-10-CM | POA: Diagnosis not present

## 2023-04-11 DIAGNOSIS — M47816 Spondylosis without myelopathy or radiculopathy, lumbar region: Secondary | ICD-10-CM | POA: Diagnosis not present

## 2023-04-11 DIAGNOSIS — Z79891 Long term (current) use of opiate analgesic: Secondary | ICD-10-CM | POA: Diagnosis not present

## 2023-04-11 DIAGNOSIS — M5136 Other intervertebral disc degeneration, lumbar region: Secondary | ICD-10-CM | POA: Diagnosis not present

## 2023-04-11 DIAGNOSIS — M797 Fibromyalgia: Secondary | ICD-10-CM | POA: Diagnosis not present

## 2023-04-11 DIAGNOSIS — M7918 Myalgia, other site: Secondary | ICD-10-CM | POA: Diagnosis not present

## 2023-05-06 ENCOUNTER — Ambulatory Visit: Payer: Medicaid Other | Admitting: Obstetrics & Gynecology

## 2023-05-06 ENCOUNTER — Encounter: Payer: Self-pay | Admitting: Obstetrics & Gynecology

## 2023-05-06 VITALS — BP 146/84 | HR 73 | Ht 68.0 in | Wt 213.4 lb

## 2023-05-06 DIAGNOSIS — N8110 Cystocele, unspecified: Secondary | ICD-10-CM | POA: Diagnosis not present

## 2023-05-06 DIAGNOSIS — K5901 Slow transit constipation: Secondary | ICD-10-CM

## 2023-05-06 MED ORDER — PREMARIN 0.625 MG/GM VA CREA: 1.0000 | TOPICAL_CREAM | Freq: Every day | VAGINAL | 12 refills | Status: AC

## 2023-05-06 MED ORDER — POLYETHYLENE GLYCOL 3350 17 GM/SCOOP PO POWD: ORAL | 11 refills | Status: DC

## 2023-05-06 NOTE — Progress Notes (Signed)
Chief Complaint  Patient presents with   Vaginal Prolapse    Blood pressure (!) 146/84, pulse 73, height 5\' 8"  (1.727 m), weight 213 lb 6.4 oz (96.8 kg), last menstrual period 09/16/1994.  Colleen Sellers presents today for routine follow up related to her pessary.   She uses a Milex ring with support #3 fitting today Her vagina is stiff no redundancy Needs topical estorgen, maybe in future ^#4 She reports no vaginal discharge and no vaginal bleeding     The pessary is good fit, comfortable     ICD-10-CM   1. Baden-Walker grade 2 cystocele  N81.10    vaginal estrogen + Milex ring with support #3, ideally would be a #4 but her vagina is too stiff, no estrogen at all for 25 years       Colleen Sellers will be sen back in 1 months for continued follow up.  Lazaro Arms, MD  05/06/2023 4:35 PM

## 2023-05-19 ENCOUNTER — Other Ambulatory Visit: Payer: Self-pay | Admitting: Family Medicine

## 2023-05-19 DIAGNOSIS — J301 Allergic rhinitis due to pollen: Secondary | ICD-10-CM

## 2023-06-05 ENCOUNTER — Ambulatory Visit: Payer: Medicaid Other | Admitting: Obstetrics & Gynecology

## 2023-06-10 ENCOUNTER — Telehealth: Payer: Self-pay

## 2023-06-10 NOTE — Telephone Encounter (Signed)
Medicaid Managed Care   Unsuccessful Outreach Note  06/10/2023 Name: SYLVETTE MORACE MRN: [id number] DOB: [birth year]  Referred by: Raliegh Ip, DO Reason for referral : No chief complaint on file.   An unsuccessful telephone outreach was attempted today. The patient was referred to the case management team for assistance with care management and care coordination.   Follow Up Plan: If patient returns call to provider office, please advise to call Embedded Care Management Care Guide Nicholes Rough* at [phone]*  Nicholes Rough, CMA Care Guide [employer]

## 2023-06-20 ENCOUNTER — Other Ambulatory Visit: Payer: Self-pay | Admitting: Family Medicine

## 2023-06-20 DIAGNOSIS — E039 Hypothyroidism, unspecified: Secondary | ICD-10-CM

## 2023-06-25 ENCOUNTER — Encounter: Payer: Self-pay | Admitting: Family Medicine

## 2023-06-25 ENCOUNTER — Ambulatory Visit: Payer: Medicaid Other | Admitting: Family Medicine

## 2023-06-25 VITALS — BP 148/98 | HR 93 | Temp 98.5°F | Ht 68.0 in | Wt 211.6 lb

## 2023-06-25 DIAGNOSIS — I10 Essential (primary) hypertension: Secondary | ICD-10-CM | POA: Diagnosis not present

## 2023-06-25 DIAGNOSIS — J3089 Other allergic rhinitis: Secondary | ICD-10-CM | POA: Diagnosis not present

## 2023-06-25 DIAGNOSIS — Z23 Encounter for immunization: Secondary | ICD-10-CM | POA: Diagnosis not present

## 2023-06-25 DIAGNOSIS — E7800 Pure hypercholesterolemia, unspecified: Secondary | ICD-10-CM

## 2023-06-25 DIAGNOSIS — G47429 Narcolepsy in conditions classified elsewhere without cataplexy: Secondary | ICD-10-CM

## 2023-06-25 DIAGNOSIS — Z0000 Encounter for general adult medical examination without abnormal findings: Secondary | ICD-10-CM

## 2023-06-25 DIAGNOSIS — G35 Multiple sclerosis: Secondary | ICD-10-CM

## 2023-06-25 DIAGNOSIS — Z8249 Family history of ischemic heart disease and other diseases of the circulatory system: Secondary | ICD-10-CM

## 2023-06-25 DIAGNOSIS — N1831 Chronic kidney disease, stage 3a: Secondary | ICD-10-CM

## 2023-06-25 DIAGNOSIS — E039 Hypothyroidism, unspecified: Secondary | ICD-10-CM

## 2023-06-25 DIAGNOSIS — Z1211 Encounter for screening for malignant neoplasm of colon: Secondary | ICD-10-CM

## 2023-06-25 MED ORDER — LOSARTAN POTASSIUM 50 MG PO TABS: 50.0000 mg | ORAL_TABLET | Freq: Two times a day (BID) | ORAL | 3 refills | Status: DC

## 2023-06-25 MED ORDER — LEVOTHYROXINE SODIUM 75 MCG PO TABS: 75.0000 ug | ORAL_TABLET | Freq: Every day | ORAL | 3 refills | Status: DC

## 2023-06-25 MED ORDER — MONTELUKAST SODIUM 10 MG PO TABS: 10.0000 mg | ORAL_TABLET | Freq: Every day | ORAL | 3 refills | Status: DC

## 2023-06-25 MED ORDER — AMPHETAMINE-DEXTROAMPHETAMINE 10 MG PO TABS: 10.0000 mg | ORAL_TABLET | Freq: Two times a day (BID) | ORAL | 0 refills | Status: DC

## 2023-06-25 NOTE — Progress Notes (Signed)
Colleen Sellers is a 57 y.o. female presents to office today for annual physical exam examination.    Concerns today include: 1. Narcolepsy She has still not yet seen neuro.  She wasn't aware that the person that called her for sleep study was the neurologist's office.  She continues to suffer from excessive daytime fatigue/ sleepiness. Uses adderall as directed for this.  2. HTN w/ HLD and fam hx CAD STRONG family history of CAD. Older brother just died of a massive heart attack.  Her sister also had a coronary blockage that resulted in ICU hospitalization recently. She used to be under the care of a heart doctor in [place] but has been lost to follow up.  Denies CP, SOB.  Compliant with losartan. Not treated with statin.  Substance use: none Health Maintenance Due  Topic Date Due   Colonoscopy  Never done   MAMMOGRAM  04/26/2023   Refills needed today: adderall  Immunization History  Administered Date(s) Administered   Influenza,inj,Quad PF,6+ Mos 08/20/2018   Tdap 03/15/2012, 06/25/2023   Past Medical History:  Diagnosis Date   Acid reflux    Anxiety    Arthritis    Asthma    Chest pain    Dyspnea, diaphoresis   CHF (congestive heart failure) (HCC)    Chronic back pain    Degenerative joint disease    Also scoliosis and pectus deformity   Depression    Fibromyalgia    Hyperlipidemia    IBS (irritable bowel syndrome)    Mild hematochezia; negative for celiac disease; mild Schatzki's ring; small hiatal hernia; minor gastric erosions; few diverticula   MS (multiple sclerosis) (HCC)    Overweight(278.02)    Negative sleep study-2010, but treated for narcolepsy   Seizure disorder [facility])    Surgical menopause 09/16/2000   age 64   Social History   Socioeconomic History   Marital status: Divorced    Spouse name: Not on file   Number of children: 4   Years of education: Not on file   Highest education level: Not on file  Occupational History   Occupation:  Disabled    Comment: Fibromyalgia, arthritis  Tobacco Use   Smoking status: Never   Smokeless tobacco: Never  Vaping Use   Vaping status: Never Used  Substance and Sexual Activity   Alcohol use: No    Alcohol/week: 0.0 standard drinks of alcohol    Comment: minimal use   Drug use: No   Sexual activity: Not Currently    Birth control/protection: Post-menopausal, Surgical    Comment: hyst  Other Topics Concern   Not on file  Social History Narrative   Expecting a granddaughter in march 2024.   Social Determinants of Health   Financial Resource Strain: Low Risk  (04/10/2023)   Received from [facility]   Overall Financial Resource Strain (CARDIA)    Difficulty of Paying Living Expenses: Not very hard  Food Insecurity: Patient Declined (04/10/2023)   Received from [facility]   Hunger Vital Sign    Worried About Running Out of Food in the Last Year: Patient declined    Ran Out of Food in the Last Year: Patient declined  Transportation Needs: No Transportation Needs (04/10/2023)   Received from [facility] - Transportation    Lack of Transportation (Medical): No    Lack of Transportation (Non-Medical): No  Physical Activity: Unknown (04/10/2023)   Received from [facility]   Exercise Vital Sign  Days of Exercise per Week: 0 days    Minutes of Exercise per Session: Not on file  Recent Concern: Physical Activity - Inactive (03/12/2023)   Exercise Vital Sign    Days of Exercise per Week: 0 days    Minutes of Exercise per Session: 0 min  Stress: Stress Concern Present (04/10/2023)   Received from [facility] - Occupational Stress Questionnaire    Feeling of Stress : Rather much  Social Connections: Socially Isolated (04/10/2023)   Received from [facility]   Social Network    How would you rate your social network (family, work, friends)?: Little participation, lonely and socially isolated  Intimate Partner  Violence: Not At Risk (04/10/2023)   Received from [facility]   HITS    Over the last 12 months how often did your partner physically hurt you?: 1    Over the last 12 months how often did your partner insult you or talk down to you?: 1    Over the last 12 months how often did your partner threaten you with physical harm?: 1    Over the last 12 months how often did your partner scream or curse at you?: 1   Past Surgical History:  Procedure Laterality Date   BRAIN SURGERY     CHOLECYSTECTOMY     COLONOSCOPY  2001, 2010   negative/friable anal canal scattered pancolonic diverticula   ESOPHAGOGASTRODUODENOSCOPY  11/22/08   noncritical Schatzi ring/small hiatal herina/tiny antral reosions   ESOPHAGOGASTRODUODENOSCOPY (EGD) WITH PROPOFOL N/A 08/22/2022   Procedure: ESOPHAGOGASTRODUODENOSCOPY (EGD) WITH PROPOFOL;  Surgeon: Corbin Ade, MD;  [facility];  Service: Endoscopy;  Laterality: N/A;  9:00am, asa 2   MALONEY DILATION N/A 08/22/2022   Procedure: MALONEY DILATION;  Surgeon: Corbin Ade, MD;  Location[facility];  Service: Endoscopy;  Laterality: N/A;   RHINOPLASTY     Deviated septum   SALPINGOOPHORECTOMY  2002   Bilateral   TUBAL LIGATION     VAGINAL HYSTERECTOMY  1996   Family History  Problem Relation Age of Onset   Depression Mother        suicide   Alcohol abuse Father    Coronary artery disease Father    Heart disease Sister    Heart attack Brother    Lung cancer Brother    Colon cancer Maternal Grandfather     Current Outpatient Medications:    cholecalciferol (VITAMIN D3) 25 MCG (1000 UNIT) tablet, Take 1,000 Units by mouth daily., Disp: , Rfl:    conjugated estrogens (PREMARIN) vaginal cream, Place 1 Applicatorful vaginally daily. Use 1 gram nightly, Disp: 30 g, Rfl: 12   cyanocobalamin (VITAMIN B12) 1000 MCG/ML injection, Inject into the muscle., Disp: , Rfl:    fluticasone (FLONASE) 50 MCG/ACT nasal spray, Place 2 sprays into both nostrils  daily., Disp: 16 g, Rfl: 6   gabapentin (NEURONTIN) 300 MG capsule, Take 300 mg by mouth 3 (three) times daily., Disp: , Rfl:    melatonin 5 MG TABS, Take 5 mg by mouth at bedtime., Disp: , Rfl:    oxyCODONE-acetaminophen (PERCOCET) 10-325 MG tablet, Take 1 tablet by mouth every 8 (eight) hours as needed for pain., Disp: , Rfl:    pantoprazole (PROTONIX) 40 MG tablet, Take 1 tablet (40 mg total) by mouth daily., Disp: 30 tablet, Rfl: 11   polyethylene glycol powder (GLYCOLAX/MIRALAX) 17 GM/SCOOP powder, 1 scoop daily or as needed, Disp: 255 g, Rfl: 11  tiZANidine (ZANAFLEX) 4 MG tablet, Take 4 mg by mouth at bedtime., Disp: , Rfl:    XTAMPZA ER 13.5 MG C12A, Take 13.5 mg by mouth 2 (two) times daily., Disp: , Rfl:    amphetamine-dextroamphetamine (ADDERALL) 10 MG tablet, Take 1 tablet (10 mg total) by mouth 2 (two) times daily., Disp: 60 tablet, Rfl: 0   levothyroxine (SYNTHROID) 75 MCG tablet, Take 1 tablet (75 mcg total) by mouth daily., Disp: 90 tablet, Rfl: 3   losartan (COZAAR) 50 MG tablet, Take 1 tablet (50 mg total) by mouth in the morning and at bedtime. For blood pressure, Disp: 180 tablet, Rfl: 3   montelukast (SINGULAIR) 10 MG tablet, Take 1 tablet (10 mg total) by mouth at bedtime., Disp: 90 tablet, Rfl: 3  Allergies  Allergen Reactions   Ceftin Other (See Comments)    Unknown   Codeine Nausea And Vomiting   Morphine Hives and Itching    IV   Adhesive [Tape] Rash   Latex Rash     ROS: Review of Systems A comprehensive review of systems was negative except for: Constitutional: positive for fatigue Integument/breast: positive for breast tenderness and lots of moles but have been evaluated by derm Musculoskeletal: positive for arthralgias, back pain, and myalgias    Physical exam BP (!) 148/98   Pulse 93   Temp 98.5 F (36.9 C)   Ht 5\' 8"  (1.727 m)   Wt 211 lb 9.6 oz (96 kg)   LMP 09/16/1994   SpO2 95%   BMI 32.17 kg/m  General appearance: alert, cooperative,  appears stated age, and no distress Head: Normocephalic, without obvious abnormality, atraumatic Eyes: negative findings: lids and lashes normal, conjunctivae and sclerae normal, corneas clear, and pupils equal, round, reactive to light and accomodation Ears: normal TM's and external ear canals both ears Nose: Nares normal. Septum midline. Mucosa normal. No drainage or sinus tenderness. Throat: lips, mucosa, and tongue normal; teeth and gums normal Neck: no adenopathy, supple, symmetrical, trachea midline, and thyroid not enlarged, symmetric, no tenderness/mass/nodules Back:  Scoliotic curve noted.  She is ambulating independently but gait is stiff Lungs: clear to auscultation bilaterally Breasts: normal appearance, no masses or tenderness, Inspection negative, No nipple retraction or dimpling, No nipple discharge or bleeding, No axillary or supraclavicular adenopathy, fibrocystic changes noted predominantly in the left breast but is present on the right as well Heart: regular rate and rhythm, S1, S2 normal, no murmur, click, rub or gallop Abdomen: soft, non-tender; bowel sounds normal; no masses,  no organomegaly Extremities: extremities normal, atraumatic, no cyanosis or edema Pulses: 2+ and symmetric Skin:  Seborrheic keratoses noted along the right lower leg.  She has multiple nevi and areas of skin sun damage Lymph nodes: Cervical, supraclavicular, and axillary nodes normal. Neurologic: Grossly normal      06/25/2023    3:53 PM 03/12/2023    3:12 PM 10/28/2022    4:07 PM  Depression screen PHQ 2/9  Decreased Interest 2 3 0  Down, Depressed, Hopeless 1 1   PHQ - 2 Score 3 4 0  Altered sleeping 1 1   Tired, decreased energy 1 3 0  Change in appetite 1 3 0  Feeling bad or failure about yourself  0 0 0  Trouble concentrating 0 2 0  Moving slowly or fidgety/restless 0 0 0  Suicidal thoughts 0 0 0  PHQ-9 Score 6 13   Difficult doing work/chores Somewhat difficult  Not difficult at all  06/25/2023    3:53 PM 03/12/2023    3:12 PM 10/28/2022    4:06 PM 06/17/2022    4:04 PM  GAD 7 : Generalized Anxiety Score  Nervous, Anxious, on Edge 0 0 1 1  Control/stop worrying 0 0 0 1  Worry too much - different things 0 0 0 0  Trouble relaxing 0 0 0 0  Restless 0 0 0 0  Easily annoyed or irritable 0 0 0 1  Afraid - awful might happen 0 0 1 0  Total GAD 7 Score 0 0 2 3  Anxiety Difficulty Not difficult at all  Somewhat difficult Somewhat difficult     Assessment/ Plan: Donald Pore here for annual physical exam.   Annual physical exam  Screen for colon cancer  Stage 3a chronic kidney disease (HCC) - Plan: CMP14+EGFR, VITAMIN D 25 Hydroxy (Vit-D Deficiency, Fractures)  Essential hypertension - Plan: CMP14+EGFR, Ambulatory referral to Cardiology, losartan (COZAAR) 50 MG tablet  Acquired hypothyroidism - Plan: TSH, T4, free, levothyroxine (SYNTHROID) 75 MCG tablet  Multiple sclerosis (HCC) - Plan: Ambulatory referral to Cardiology, amphetamine-dextroamphetamine (ADDERALL) 10 MG tablet  Pure hypercholesterolemia - Plan: Lipid panel, Ambulatory referral to Cardiology  Family history of ischemic heart disease - Plan: Ambulatory referral to Cardiology  Narcolepsy due to underlying condition without cataplexy - Plan: amphetamine-dextroamphetamine (ADDERALL) 10 MG tablet  Seasonal allergic rhinitis due to other allergic trigger - Plan: montelukast (SINGULAIR) 10 MG tablet  She will schedule colonoscopy/ mammogram.  Tetanus administered. She declined flu shot.  Fasting labs have been ordered. She will schedule lab appt.   Suffering from chronic fatigue. Difficult to tell if due to multiple sedating meds vs narcolepsy vs undiagnosed OSA?  Adderall renewed. Further fills pending eval with neuro. The Narcotic Database has been reviewed.  There were no red flags.    Referral to cardiology given strong fam hx of CAD and known HTN and HLD.  BP is NOT at goal today. Will have  rechecked again when she comes in for labs. If persistently elevated plan for norvasc 2.5mg   Did not discuss allergies. Singulair renewed.  Counseled on healthy lifestyle choices, including diet (rich in fruits, vegetables and lean meats and low in salt and simple carbohydrates) and exercise (at least 30 minutes of moderate physical activity daily).  Patient to follow up 72m for thyroid/ renal check  Sruti Ayllon M. Nadine Counts, DO

## 2023-06-25 NOTE — Patient Instructions (Addendum)
Schedule colonoscopy Schedule Mammogram I've referred you to the heart doctor Call the neurologist back.  This is the last refill I can provide on the adderall. Further fills will have to come from them. I placed orders for you to come in for cholesterol

## 2023-06-26 ENCOUNTER — Other Ambulatory Visit: Payer: Medicaid Other

## 2023-07-10 ENCOUNTER — Other Ambulatory Visit: Payer: Self-pay | Admitting: Family Medicine

## 2023-07-10 DIAGNOSIS — J301 Allergic rhinitis due to pollen: Secondary | ICD-10-CM

## 2023-07-15 ENCOUNTER — Other Ambulatory Visit: Payer: Self-pay | Admitting: Family Medicine

## 2023-07-15 DIAGNOSIS — Z1231 Encounter for screening mammogram for malignant neoplasm of breast: Secondary | ICD-10-CM

## 2023-07-21 ENCOUNTER — Ambulatory Visit
Admission: RE | Admit: 2023-07-21 | Discharge: 2023-07-21 | Disposition: A | Discharge status: Home / Self Care | Payer: Medicaid Other | Source: Ambulatory Visit | Attending: Family Medicine | Admitting: Family Medicine

## 2023-07-21 DIAGNOSIS — Z1231 Encounter for screening mammogram for malignant neoplasm of breast: Secondary | ICD-10-CM

## 2023-07-25 ENCOUNTER — Ambulatory Visit: Payer: Self-pay | Admitting: Family Medicine

## 2023-07-25 ENCOUNTER — Ambulatory Visit: Payer: Medicaid Other | Admitting: Nurse Practitioner

## 2023-07-25 ENCOUNTER — Encounter: Payer: Self-pay | Admitting: Nurse Practitioner

## 2023-07-25 VITALS — BP 158/78 | HR 72 | Temp 98.2°F | Resp 20 | Ht 68.0 in | Wt 211.0 lb

## 2023-07-25 DIAGNOSIS — I10 Essential (primary) hypertension: Secondary | ICD-10-CM

## 2023-07-25 DIAGNOSIS — R319 Hematuria, unspecified: Secondary | ICD-10-CM | POA: Diagnosis not present

## 2023-07-25 MED ORDER — LOSARTAN POTASSIUM 100 MG PO TABS: 100.0000 mg | ORAL_TABLET | Freq: Every day | ORAL | 1 refills | Status: DC

## 2023-07-25 MED ORDER — AMLODIPINE BESYLATE 5 MG PO TABS: 5.0000 mg | ORAL_TABLET | Freq: Every day | ORAL | 11 refills | Status: DC

## 2023-07-25 NOTE — Telephone Encounter (Signed)
  Reason for Disposition . Blood in urine (red, pink, or tea-colored)  Protocols used: Urination Pain - Female-A-AH

## 2023-07-25 NOTE — Addendum Note (Signed)
Addended by: Bennie Pierini on: 07/25/2023 04:36 PM   Modules accepted: Orders

## 2023-07-25 NOTE — Patient Instructions (Signed)

## 2023-07-25 NOTE — Progress Notes (Addendum)
Subjective:    Patient ID: Colleen Sellers, female    DOB: 1965-10-22, 57 y.o.   MRN: [id number]   Chief Complaint: blood in urine and Hypertension   Hypertension Pertinent negatives include no chest pain, headaches, palpitations or shortness of breath.  Hematuria Pertinent negatives include no abdominal pain. Her past medical history is significant for hypertension.   Patient in with 2 complaints: - blood in urine- occurred yesterday 1 x. Dribbling urine with pain.  - hypertension- blood pressure has been over 160 systolic for several weeks. She is currently on cozaar.  BP Readings from Last 3 Encounters:  06/25/23 (!) 148/98  05/06/23 (!) 146/84  03/12/23 139/89    Patient Active Problem List   Diagnosis Date Noted   Cystocele with rectocele 03/12/2023   S/P hysterectomy 03/12/2023   Dysphagia 07/24/2022   Stage 3a chronic kidney disease (HCC) 06/17/2022   Urge incontinence 06/04/2021   Overactive bladder 05/31/2019   Acquired hypothyroidism 10/16/2018   Essential hypertension 10/16/2018   Lumbar degenerative disc disease 01/09/2018   Spondylosis of lumbar spine 01/09/2018   Thoracic spondylosis without myelopathy 01/09/2018   H/O TIA (transient ischemic attack) and stroke 07/23/2015   Poor concentration 07/23/2015   B12 deficiency 07/23/2015   Hyperlipidemia 07/23/2015   Insomnia 07/23/2015   Multiple sclerosis (HCC) 07/20/2015   Cephalalgia    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  Eyes:  Negative for pain.  Respiratory:  Negative for shortness of breath.   Cardiovascular:  Negative for chest pain, palpitations and leg swelling.  Gastrointestinal:  Negative for abdominal pain.  Endocrine: Negative for polydipsia.  Genitourinary:  Positive for hematuria.  Skin:   Negative for rash.  Neurological:  Negative for dizziness, weakness and headaches.  Hematological:  Does not bruise/bleed easily.  All other systems reviewed and are negative.      Objective:   Physical Exam Constitutional:      Appearance: Normal appearance.  Cardiovascular:     Rate and Rhythm: Normal rate and regular rhythm.     Heart sounds: Normal heart sounds.  Pulmonary:     Effort: Pulmonary effort is normal.     Breath sounds: Normal breath sounds.  Skin:    General: Skin is warm.  Neurological:     General: No focal deficit present.     Mental Status: She is alert and oriented to person, place, and time.  Psychiatric:        Mood and Affect: Mood normal.        Behavior: Behavior normal.       Urine clear      Assessment & Plan:   Colleen Sellers in today with chief complaint of Hypertension and Hematuria   1. Hematuria, unspecified type Force fluids- cranberry juice Urine culture pending - Urinalysis, Complete - Urine Culture  2. Essential hypertension Changed cozaar to 100mg  1 tablet daily instead of cozaar 50mg  bid Added amlodipine 5mg  daily Need to keep diary of blood pressure Meds ordered this encounter  Medications   losartan (COZAAR) 100 MG tablet    Sig: Take 1 tablet (100 mg total) by mouth daily.    Dispense:  90 tablet    Refill:  1  Order Specific Question:   Supervising Provider    Answer:   Arville Care A [[id number]]   amLODipine (NORVASC) 5 MG tablet    Sig: Take 1 tablet (5 mg total) by mouth daily.    Dispense:  30 tablet    Refill:  11    Order Specific Question:   Supervising Provider    Answer:   Arville Care A [[id number]]     The above assessment and management plan was discussed with the patient. The patient verbalized understanding of and has agreed to the management plan. Patient is aware to call the clinic if symptoms persist or worsen. Patient is aware when to return to the clinic for a follow-up visit.  Patient educated on when it is appropriate to go to the emergency department.   Mary-Margaret Daphine Deutscher, FNP

## 2023-07-27 LAB — URINE CULTURE: Organism ID, Bacteria: NO GROWTH

## 2023-07-28 LAB — URINALYSIS, COMPLETE
Bilirubin, UA: NEGATIVE
Glucose, UA: NEGATIVE
Ketones, UA: NEGATIVE
Leukocytes,UA: NEGATIVE
Nitrite, UA: NEGATIVE
Specific Gravity, UA: >1.03 — ABNORMAL HIGH (ref 1.005–1.030)
Urobilinogen, Ur: 0.2 mg/dL (ref 0.2–1.0)
pH, UA: 5.5 (ref 5.0–7.5)

## 2023-07-30 ENCOUNTER — Other Ambulatory Visit (HOSPITAL_COMMUNITY)
Admission: RE | Admit: 2023-07-30 | Discharge: 2023-07-30 | Disposition: A | Discharge status: Home / Self Care | Payer: Medicaid Other | Source: Ambulatory Visit | Attending: Internal Medicine | Admitting: Internal Medicine

## 2023-07-30 ENCOUNTER — Encounter: Payer: Self-pay | Admitting: Internal Medicine

## 2023-07-30 ENCOUNTER — Ambulatory Visit: Payer: Medicaid Other | Attending: Internal Medicine | Admitting: Internal Medicine

## 2023-07-30 VITALS — BP 130/82 | HR 84 | Ht 68.5 in | Wt 211.6 lb

## 2023-07-30 DIAGNOSIS — I10 Essential (primary) hypertension: Secondary | ICD-10-CM | POA: Diagnosis not present

## 2023-07-30 DIAGNOSIS — R079 Chest pain, unspecified: Secondary | ICD-10-CM

## 2023-07-30 DIAGNOSIS — E785 Hyperlipidemia, unspecified: Secondary | ICD-10-CM

## 2023-07-30 LAB — BASIC METABOLIC PANEL
Anion gap: 10 (ref 5–15)
BUN: 15 mg/dL (ref 6–20)
CO2: 26 mmol/L (ref 22–32)
Calcium: 9.2 mg/dL (ref 8.9–10.3)
Chloride: 100 mmol/L (ref 98–111)
Creatinine, Ser: 1.08 mg/dL — ABNORMAL HIGH (ref 0.44–1.00)
GFR, Estimated: 59 mL/min — ABNORMAL LOW (ref >60)
Glucose, Bld: 116 mg/dL — ABNORMAL HIGH (ref 70–99)
Potassium: 3.9 mmol/L (ref 3.5–5.1)
Sodium: 136 mmol/L (ref 135–145)

## 2023-07-30 MED ORDER — ROSUVASTATIN CALCIUM 20 MG PO TABS
20.0000 mg | ORAL_TABLET | Freq: Every day | ORAL | 3 refills | Status: DC
Start: 2023-07-30 — End: 2024-07-12

## 2023-07-30 MED ORDER — METOPROLOL TARTRATE 100 MG PO TABS: 100.0000 mg | ORAL_TABLET | ORAL | 0 refills | Status: AC

## 2023-07-30 NOTE — Progress Notes (Signed)
Cardiology Office Note   Date:  07/30/2023   ID:  Colleen Sellers, DOB 1966/08/28, MRN [id number]  PCP:  Raliegh Ip, DO  Cardiologist:   Dietrich Pates, MD   Pt referred for cardiac risk assessment     History of Present Illness: Colleen Sellers is a 57 y.o. female with a history of HTN and CP   The pt says she has CP when her BP his high  Will feel increased pressure in chest    Like has to burp     Pt notes occasional heart racing, sometimes associated with dizziness  No syncope   Spells last about 30 seconds    Pt notes spells of diaphoresis that are new  Overall patient not too active  Strong FHx of CAD   Brother had MI at 21  Sister had intervention.   Father died of MI at 31         Current Meds  Medication Sig   amphetamine-dextroamphetamine (ADDERALL) 10 MG tablet Take 1 tablet (10 mg total) by mouth 2 (two) times daily. (Patient taking differently: Take 10 mg by mouth daily with breakfast.)   conjugated estrogens (PREMARIN) vaginal cream Place 1 Applicatorful vaginally daily. Use 1 gram nightly   cyanocobalamin (VITAMIN B12) 1000 MCG/ML injection Inject into the muscle.   gabapentin (NEURONTIN) 300 MG capsule Take 300 mg by mouth 3 (three) times daily.   levothyroxine (SYNTHROID) 75 MCG tablet Take 1 tablet (75 mcg total) by mouth daily.   melatonin 5 MG TABS Take 5 mg by mouth at bedtime.   montelukast (SINGULAIR) 10 MG tablet Take 1 tablet (10 mg total) by mouth at bedtime.   oxyCODONE-acetaminophen (PERCOCET) 10-325 MG tablet Take 1 tablet by mouth every 8 (eight) hours as needed for pain.   polyethylene glycol powder (GLYCOLAX/MIRALAX) 17 GM/SCOOP powder 1 scoop daily or as needed   tiZANidine (ZANAFLEX) 4 MG tablet Take 4 mg by mouth at bedtime.   XTAMPZA ER 13.5 MG C12A Take 13.5 mg by mouth 2 (two) times daily.     Allergies:   Ceftin, Codeine, Morphine, Adhesive [tape], and Latex   Past Medical History:  Diagnosis Date   Acid reflux    Anxiety     Arthritis    Asthma    Chest pain    Dyspnea, diaphoresis   CHF (congestive heart failure) (HCC)    Chronic back pain    Degenerative joint disease    Also scoliosis and pectus deformity   Depression    Fibromyalgia    Hyperlipidemia    IBS (irritable bowel syndrome)    Mild hematochezia; negative for celiac disease; mild Schatzki's ring; small hiatal hernia; minor gastric erosions; few diverticula   MS (multiple sclerosis) (HCC)    Overweight(278.02)    Negative sleep study-2010, but treated for narcolepsy   Seizure disorder [facility])    Surgical menopause 09/16/2000   age 81    Past Surgical History:  Procedure Laterality Date   BRAIN SURGERY     CHOLECYSTECTOMY     COLONOSCOPY  2001, 2010   negative/friable anal canal scattered pancolonic diverticula   ESOPHAGOGASTRODUODENOSCOPY  11/22/08   noncritical Schatzi ring/small hiatal herina/tiny antral reosions   ESOPHAGOGASTRODUODENOSCOPY (EGD) WITH PROPOFOL N/A 08/22/2022   Procedure: ESOPHAGOGASTRODUODENOSCOPY (EGD) WITH PROPOFOL;  Surgeon: Colleen Ade, MD;  [facility];  Service: Endoscopy;  Laterality: N/A;  9:00am, asa 2   MALONEY DILATION N/A 08/22/2022   Procedure: MALONEY DILATION;  Surgeon: Colleen Ade, MD;  [facility];  Service: Endoscopy;  Laterality: N/A;   RHINOPLASTY     Deviated septum   SALPINGOOPHORECTOMY  2002   Bilateral   TUBAL LIGATION     VAGINAL HYSTERECTOMY  1996     Social History:  The patient  reports that she has never smoked. She has never used smokeless tobacco. She reports that she does not drink alcohol and does not use drugs.   Family History:  The patient's family history includes Alcohol abuse in her father; Colon cancer in her maternal grandfather; Coronary artery disease in her father; Depression in her mother; Heart attack in her brother; Heart disease in her sister; Lung cancer in her brother.    ROS:  Please see the history of present illness. All other  systems are reviewed and  Negative to the above problem except as noted.    PHYSICAL EXAM: VS:  BP 130/82   Pulse 84   Ht 5' 8.5" (1.74 m)   Wt 211 lb 9.6 oz (96 kg)   LMP 09/16/1994   SpO2 97%   BMI 31.71 kg/m   BP on my check 150/100 GEN: Well nourished, well developed, in no acute distress  HEENT: normal  Neck: no JVD, no bruits Cardiac: RRR; no murmurs,  NO LE edema  Respiratory:  clear to auscultation  GI: soft, nontender,  No hepatomegaly  MS: no deformity Moving all extremities    EKG:  EKG is ordered today.  SR 89 bpm   Lipid Panel    Component Value Date/Time   CHOL 241 (H) 06/17/2022 1607   TRIG 126 06/17/2022 1607   HDL 59 06/17/2022 1607   CHOLHDL 4.1 06/17/2022 1607   LDLCALC 160 (H) 06/17/2022 1607      Wt Readings from Last 3 Encounters:  07/30/23 211 lb 9.6 oz (96 kg)  07/25/23 211 lb (95.7 kg)  06/25/23 211 lb 9.6 oz (96 kg)      ASSESSMENT AND PLAN:  1  Chest pressure   PT with spells of chest pressure, mainly when BP is high  May be related to this  But she has a bad family hx of CAD  I would recomm CCTA to evaluate anatomy Take 81 mg ecASA for now   2 HTN  PT is high  She was given Rx for amlodipine but did not start it yet  I would recomm starting at 2.5 mg   Increase to 5 mg   Follow   3   Lipids   LDL in 2023  Would start Crestor 20 mg  Follow up lipomed and liver panel in 8 wks    Follow up based on test results    Current medicines are reviewed at length with the patient today.  The patient does not have concerns regarding medicines.  Signed, Dietrich Pates, MD  07/30/2023 2:34 PM    [facility] [address] [place], [place], [place]  [address] Phone: [phone]; Fax: [phone]

## 2023-07-30 NOTE — Patient Instructions (Signed)
Medication Instructions:  Your physician has recommended you make the following change in your medication:   Start Norvasc prescribed by PCP  Start Crestor 20 mg Daily   *If you need a refill on your cardiac medications before your next appointment, please call your pharmacy*   Lab Work: Your physician recommends that you return for lab work in: 8 Weeks at [employer]   If you have labs (blood work) drawn today and your tests are completely normal, you will receive your results only by: [employer] (if you have MyChart) OR A paper copy in the mail If you have any lab test that is abnormal or we need to change your treatment, we will call you to review the results.   Testing/Procedures:   Your cardiac CT will be scheduled at one of the below locations:   [facility] [address] [place], [place] [address] [phone]  OR  [facility] [address] [place], [place] [address] [phone]  OR   [facility] [address] [place], [place] [address] [phone]  If scheduled at [facility], please arrive at the [facility] and Children's Entrance (Entrance C2) of [facility] 30 minutes prior to test start time. You can use the FREE valet parking offered at entrance C (encouraged to control the heart rate for the test)  Proceed to the [facility] Radiology Department (first floor) to check-in and test prep.  All radiology patients and guests should use entrance C2 at [facility], accessed from [facility], even though the hospital's physical address listed is [address]    If scheduled at [facility] or [facility], please arrive 15 mins early for check-in and test prep.  There is spacious parking and easy access to the radiology department from the [facility] V[facility] entrance. Please  enter here and check-in with the desk attendant.   Please follow these instructions carefully (unless otherwise directed):  An IV will be required for this test and Nitroglycerin will be given.  Hold all erectile dysfunction medications at least 3 days (72 hrs) prior to test. (Ie viagra, cialis, sildenafil, tadalafil, etc)   On the Night Before the Test: Be sure to Drink plenty of water. Do not consume any caffeinated/decaffeinated beverages or chocolate 12 hours prior to your test. Do not take any antihistamines 12 hours prior to your test.  On the Day of the Test: Drink plenty of water until 1 hour prior to the test. Do not eat any food 1 hour prior to test. You may take your regular medications prior to the test.  Take metoprolol (Lopressor) two hours prior to test. If you take Furosemide/Hydrochlorothiazide/Spironolactone, please HOLD on the morning of the test. FEMALES- please wear underwire-free bra if available, avoid dresses & tight clothing  *For Clinical Staff only. Please instruct patient the following:* Heart Rate Medication Recommendations for Cardiac CT  Resting HR < 50 bpm  No medication  Resting HR 50-60 bpm and BP >110/50 mmHG   Consider Metoprolol tartrate 25 mg PO 90-120 min prior to scan  Resting HR 60-65 bpm and BP >110/50 mmHG  Metoprolol tartrate 50 mg PO 90-120 minutes prior to scan   Resting HR > 65 bpm and BP >110/50 mmHG  Metoprolol tartrate 100 mg PO 90-120 minutes prior to scan  Consider Ivabradine 10-15 mg PO or a calcium channel blocker for resting HR >60  bpm and contraindication to metoprolol tartrate  Consider Ivabradine 10-15 mg PO in combination with metoprolol tartrate for HR >80 bpm        After the Test: Drink plenty of water. After receiving IV contrast, you may experience a mild flushed feeling. This is normal. On occasion, you may experience a mild rash up to 24 hours after the test. This is not dangerous. If this occurs, you can take  Benadryl 25 mg and increase your fluid intake. If you experience trouble breathing, this can be serious. If it is severe call 911 IMMEDIATELY. If it is mild, please call our office. If you take any of these medications: Glipizide/Metformin, Avandament, Glucavance, please do not take 48 hours after completing test unless otherwise instructed.  We will call to schedule your test 2-4 weeks out understanding that some insurance companies will need an authorization prior to the service being performed.   For more information and frequently asked questions, please visit our website : [email or web address]  For non-scheduling related questions, please contact the cardiac imaging nurse navigator should you have any questions/concerns: Cardiac Imaging Nurse Navigators Direct Office Dial: [phone]   For scheduling needs, including cancellations and rescheduling, please call [place], [phone].    Follow-Up: At [facility], you and your health needs are our priority.  As part of our continuing mission to provide you with exceptional heart care, we have created designated Provider Care Teams.  These Care Teams include your primary Cardiologist (physician) and Advanced Practice Providers (APPs -  Physician Assistants and Nurse Practitioners) who all work together to provide you with the care you need, when you need it.  We recommend signing up for the patient portal called "MyChart".  Sign up information is provided on this After Visit Summary.  MyChart is used to connect with patients for Virtual Visits (Telemedicine).  Patients are able to view lab/test results, encounter notes, upcoming appointments, etc.  Non-urgent messages can be sent to your provider as well.   To learn more about what you can do with MyChart, go to [email or web address].    Your next appointment:    Pending Test Results   Provider:   You may see Dietrich Pates, MD or one of the following Advanced  Practice Providers on your designated Care Team:   Randall An, PA-C  Jacolyn Reedy, PA-C     Other Instructions Thank you for choosing [facility]!

## 2023-08-15 ENCOUNTER — Telehealth (HOSPITAL_COMMUNITY): Payer: Self-pay | Admitting: Emergency Medicine

## 2023-08-15 NOTE — Telephone Encounter (Signed)
Reaching out to patient to offer assistance regarding upcoming cardiac imaging study; pt verbalizes understanding of appt date/time, parking situation and where to check in, pre-test NPO status and medications ordered, and verified current allergies; name and call back number provided for further questions should they arise Cayne Yom RN Navigator Cardiac Imaging [facility] [phone] office [phone] cell 

## 2023-08-18 ENCOUNTER — Ambulatory Visit (HOSPITAL_COMMUNITY)
Admission: RE | Admit: 2023-08-18 | Discharge: 2023-08-18 | Disposition: A | Discharge status: Home / Self Care | Payer: Medicaid Other | Source: Ambulatory Visit | Attending: Internal Medicine | Admitting: Internal Medicine

## 2023-08-18 DIAGNOSIS — R079 Chest pain, unspecified: Secondary | ICD-10-CM | POA: Diagnosis present

## 2023-08-18 DIAGNOSIS — R072 Precordial pain: Secondary | ICD-10-CM | POA: Diagnosis not present

## 2023-08-18 MED ORDER — NITROGLYCERIN 0.4 MG SL SUBL
0.8000 mg | SUBLINGUAL_TABLET | Freq: Once | SUBLINGUAL | Status: AC
  Administered 2023-08-18: 0.8 mg via SUBLINGUAL

## 2023-08-18 MED ORDER — IOHEXOL 350 MG/ML SOLN
100.0000 mL | Freq: Once | INTRAVENOUS | Status: AC | PRN
  Administered 2023-08-18: 100 mL via INTRAVENOUS

## 2023-08-18 MED ORDER — NITROGLYCERIN 0.4 MG SL SUBL
SUBLINGUAL_TABLET | SUBLINGUAL | Status: AC
  Filled 2023-08-18: qty 2

## 2023-08-18 MED ORDER — METOPROLOL TARTRATE 5 MG/5ML IV SOLN
INTRAVENOUS | Status: AC
  Filled 2023-08-18: qty 20

## 2023-08-22 DIAGNOSIS — G894 Chronic pain syndrome: Secondary | ICD-10-CM | POA: Diagnosis not present

## 2023-08-22 DIAGNOSIS — M51362 Other intervertebral disc degeneration, lumbar region with discogenic back pain and lower extremity pain: Secondary | ICD-10-CM | POA: Diagnosis not present

## 2023-08-22 DIAGNOSIS — M47816 Spondylosis without myelopathy or radiculopathy, lumbar region: Secondary | ICD-10-CM | POA: Diagnosis not present

## 2023-08-22 DIAGNOSIS — M7918 Myalgia, other site: Secondary | ICD-10-CM | POA: Diagnosis not present

## 2023-11-04 ENCOUNTER — Ambulatory Visit: Payer: Self-pay | Admitting: Family Medicine

## 2023-11-04 DIAGNOSIS — K047 Periapical abscess without sinus: Secondary | ICD-10-CM | POA: Diagnosis not present

## 2023-11-04 NOTE — Telephone Encounter (Signed)
Copied from CRM [id number]. Topic: Clinical - Red Word Triage >> Nov 04, 2023 10:59 AM Gildardo Pounds wrote: Red Word that prompted transfer to Nurse Triage: needs a prescription for abscess for tooth. my left jaw and neck all the way down is swollen. pain level 11. callback number is [phone]  Chief Complaint: tooth abscess, fever Symptoms: see above Frequency: started this weekend Pertinent Negatives: Patient denies cp, sob, rash Disposition: [] ED /[x] Urgent Care (no appt availability in office) / [] Appointment(In office/virtual)/ []  [facility]/ [] Home Care/ [] Refused Recommended Disposition /[] [facility]/ []  Follow-up with PCP Additional Notes: No apt available for today; instructed to go to UC.  PCP office updated; care advice given, denies questions.  Reason for Disposition  Fever  Answer Assessment - Initial Assessment Questions 1. LOCATION: "Which tooth is hurting?"  (e.g., right-side/left-side, upper/lower, front/back)     Abscess bottom left back jaw and tooth 2. ONSET: "When did the toothache start?"  (e.g., hours, days)      Over the weekend can called dentist first thing Monday and they told her to call her PCP 3. SEVERITY: "How bad is the toothache?"  (Scale 1-10; mild, moderate or severe)   - MILD (1-3): doesn't interfere with chewing    - MODERATE (4-7): interferes with chewing, interferes with normal activities, awakens from sleep     - SEVERE (8-10): unable to eat, unable to do any normal activities, excruciating pain        11/10 4. SWELLING: "Is there any visible swelling of your face?"     yes 5. OTHER SYMPTOMS: "Do you have any other symptoms?" (e.g., fever)     Yes, did not take it, but has chills.  6. PREGNANCY: "Is there any chance you are pregnant?" "When was your last menstrual period?"     na  Protocols used: [facility]

## 2023-11-04 NOTE — Telephone Encounter (Signed)
Pt going to UC. LS

## 2023-11-20 DIAGNOSIS — M51362 Other intervertebral disc degeneration, lumbar region with discogenic back pain and lower extremity pain: Secondary | ICD-10-CM | POA: Diagnosis not present

## 2023-11-20 DIAGNOSIS — G894 Chronic pain syndrome: Secondary | ICD-10-CM | POA: Diagnosis not present

## 2023-11-20 DIAGNOSIS — Z79891 Long term (current) use of opiate analgesic: Secondary | ICD-10-CM | POA: Diagnosis not present

## 2023-11-20 DIAGNOSIS — M47816 Spondylosis without myelopathy or radiculopathy, lumbar region: Secondary | ICD-10-CM | POA: Diagnosis not present

## 2023-11-20 DIAGNOSIS — M797 Fibromyalgia: Secondary | ICD-10-CM | POA: Diagnosis not present

## 2023-11-20 DIAGNOSIS — Z5181 Encounter for therapeutic drug level monitoring: Secondary | ICD-10-CM | POA: Diagnosis not present

## 2023-11-25 ENCOUNTER — Other Ambulatory Visit: Payer: Self-pay | Admitting: Family Medicine

## 2023-11-28 ENCOUNTER — Other Ambulatory Visit: Payer: Self-pay | Admitting: Nurse Practitioner

## 2023-11-28 DIAGNOSIS — I10 Essential (primary) hypertension: Secondary | ICD-10-CM

## 2023-12-24 ENCOUNTER — Ambulatory Visit: Payer: Medicaid Other | Admitting: Family Medicine

## 2023-12-24 ENCOUNTER — Encounter: Payer: Self-pay | Admitting: Family Medicine

## 2023-12-24 VITALS — BP 125/76 | HR 82 | Temp 98.7°F | Ht 68.0 in | Wt 205.6 lb

## 2023-12-24 DIAGNOSIS — N1831 Chronic kidney disease, stage 3a: Secondary | ICD-10-CM

## 2023-12-24 DIAGNOSIS — G47429 Narcolepsy in conditions classified elsewhere without cataplexy: Secondary | ICD-10-CM

## 2023-12-24 DIAGNOSIS — E7800 Pure hypercholesterolemia, unspecified: Secondary | ICD-10-CM

## 2023-12-24 DIAGNOSIS — I10 Essential (primary) hypertension: Secondary | ICD-10-CM | POA: Diagnosis not present

## 2023-12-24 DIAGNOSIS — J3089 Other allergic rhinitis: Secondary | ICD-10-CM | POA: Diagnosis not present

## 2023-12-24 DIAGNOSIS — E039 Hypothyroidism, unspecified: Secondary | ICD-10-CM

## 2023-12-24 DIAGNOSIS — J309 Allergic rhinitis, unspecified: Secondary | ICD-10-CM | POA: Insufficient documentation

## 2023-12-24 MED ORDER — DESLORATADINE 5 MG PO TABS: 5.0000 mg | ORAL_TABLET | Freq: Every day | ORAL | 3 refills | Status: AC

## 2023-12-24 MED ORDER — LOSARTAN POTASSIUM 100 MG PO TABS: 100.0000 mg | ORAL_TABLET | Freq: Every day | ORAL | 3 refills | Status: AC

## 2023-12-24 MED ORDER — FLUTICASONE PROPIONATE 50 MCG/ACT NA SUSP: 2.0000 | Freq: Every day | NASAL | 6 refills | Status: AC

## 2023-12-24 MED ORDER — MONTELUKAST SODIUM 10 MG PO TABS: 10.0000 mg | ORAL_TABLET | Freq: Every day | ORAL | 3 refills | Status: AC

## 2023-12-24 MED ORDER — AMLODIPINE BESYLATE 5 MG PO TABS: 5.0000 mg | ORAL_TABLET | Freq: Every day | ORAL | 3 refills | Status: AC

## 2023-12-24 MED ORDER — LEVOTHYROXINE SODIUM 75 MCG PO TABS: 75.0000 ug | ORAL_TABLET | Freq: Every day | ORAL | 3 refills | Status: DC

## 2023-12-24 NOTE — Progress Notes (Signed)
 Subjective: CC: Follow-up hypothyroidism PCP: Raliegh Ip, DO Colleen Sellers is a 58 y.o. female presenting to clinic today for:  1.  Hypothyroidism Compliant with Synthroid.  No reports of tremor, heart palpitations or changes in bowel habits.  She has had some intermittent swelling in her feet and abdomen but admits that she continues to consume a lot of salt  2.  Hypertension associate with hyperlipidemia Compliant with medications.  No reports of chest pain, shortness of breath.  She reports some intermittent edema as above.  Not following a low-salt diet.  3.  Narcolepsy Patient with history of narcolepsy, previously treated by neurology.  She never did go and established with neurology in [place] but asks if she can be referred to [place] since her pain management centers there.  4.  Allergic rhinitis Compliant with Singulair but this is not controlling her allergies.  Asking for renewal on Flonase.  Does not take any oral antihistamines OTC but would be willing to do so.  She reports eye irritation that is not relieved by eyedrops   ROS: Per HPI  Allergies  Allergen Reactions   Ceftin Other (See Comments)    Unknown   Codeine Nausea And Vomiting   Morphine Hives and Itching    IV   Adhesive [Tape] Rash   Latex Rash   Past Medical History:  Diagnosis Date   Acid reflux    Anxiety    Arthritis    Asthma    Chest pain    Dyspnea, diaphoresis   CHF (congestive heart failure) (HCC)    Chronic back pain    Degenerative joint disease    Also scoliosis and pectus deformity   Depression    Fibromyalgia    Hyperlipidemia    IBS (irritable bowel syndrome)    Mild hematochezia; negative for celiac disease; mild Schatzki's ring; small hiatal hernia; minor gastric erosions; few diverticula   MS (multiple sclerosis) (HCC)    Overweight(278.02)    Negative sleep study-2010, but treated for narcolepsy   Seizure disorder (HCC)    Surgical menopause  09/16/2000   age 58    Current Outpatient Medications:    amLODipine (NORVASC) 5 MG tablet, Take 1 tablet (5 mg total) by mouth daily. (Patient not taking: Reported on 07/30/2023), Disp: 30 tablet, Rfl: 11   amphetamine-dextroamphetamine (ADDERALL) 10 MG tablet, Take 1 tablet (10 mg total) by mouth 2 (two) times daily. (Patient taking differently: Take 10 mg by mouth daily with breakfast.), Disp: 60 tablet, Rfl: 0   cholecalciferol (VITAMIN D3) 25 MCG (1000 UNIT) tablet, Take 1,000 Units by mouth daily. (Patient not taking: Reported on 07/30/2023), Disp: , Rfl:    conjugated estrogens (PREMARIN) vaginal cream, Place 1 Applicatorful vaginally daily. Use 1 gram nightly, Disp: 30 g, Rfl: 12   cyanocobalamin (VITAMIN B12) 1000 MCG/ML injection, Inject into the muscle., Disp: , Rfl:    fluticasone (FLONASE) 50 MCG/ACT nasal spray, Place 2 sprays into both nostrils daily. (Patient not taking: Reported on 07/30/2023), Disp: 16 g, Rfl: 6   gabapentin (NEURONTIN) 300 MG capsule, Take 300 mg by mouth 3 (three) times daily., Disp: , Rfl:    levothyroxine (SYNTHROID) 75 MCG tablet, Take 1 tablet (75 mcg total) by mouth daily., Disp: 90 tablet, Rfl: 3   losartan (COZAAR) 100 MG tablet, TAKE 1 TABLET BY MOUTH EVERY DAY, Disp: 90 tablet, Rfl: 1   melatonin 5 MG TABS, Take 5 mg by mouth at bedtime., Disp: , Rfl:  metoprolol tartrate (LOPRESSOR) 100 MG tablet, Take 1 tablet (100 mg total) by mouth as directed. Take 2 hours prior to CT Scan, Disp: 1 tablet, Rfl: 0   montelukast (SINGULAIR) 10 MG tablet, Take 1 tablet (10 mg total) by mouth at bedtime., Disp: 90 tablet, Rfl: 3   oxyCODONE-acetaminophen (PERCOCET) 10-325 MG tablet, Take 1 tablet by mouth every 8 (eight) hours as needed for pain., Disp: , Rfl:    pantoprazole (PROTONIX) 40 MG tablet, Take 1 tablet (40 mg total) by mouth daily. (Patient not taking: Reported on 07/30/2023), Disp: 30 tablet, Rfl: 11   polyethylene glycol powder (GLYCOLAX/MIRALAX) 17  GM/SCOOP powder, 1 scoop daily or as needed, Disp: 255 g, Rfl: 11   rosuvastatin (CRESTOR) 20 MG tablet, Take 1 tablet (20 mg total) by mouth daily., Disp: 90 tablet, Rfl: 3   tiZANidine (ZANAFLEX) 4 MG tablet, Take 4 mg by mouth at bedtime., Disp: , Rfl:    XTAMPZA ER 13.5 MG C12A, Take 13.5 mg by mouth 2 (two) times daily., Disp: , Rfl:  Social History   Socioeconomic History   Marital status: Divorced    Spouse name: Not on file   Number of children: 4   Years of education: Not on file   Highest education level: Not on file  Occupational History   Occupation: Disabled    Comment: Fibromyalgia, arthritis  Tobacco Use   Smoking status: Never   Smokeless tobacco: Never  Vaping Use   Vaping status: Never Used  Substance and Sexual Activity   Alcohol use: No    Alcohol/week: 0.0 standard drinks of alcohol    Comment: minimal use   Drug use: No   Sexual activity: Not Currently    Birth control/protection: Post-menopausal, Surgical    Comment: hyst  Other Topics Concern   Not on file  Social History Narrative   Expecting a granddaughter in march 2024.   Social Drivers of Corporate investment banker Strain: Low Risk  (04/10/2023)   Received from [facility]   Overall Financial Resource Strain (CARDIA)    Difficulty of Paying Living Expenses: Not very hard  Food Insecurity: Patient Declined (04/10/2023)   Received from [facility]   Hunger Vital Sign    Worried About Running Out of Food in the Last Year: Patient declined    Ran Out of Food in the Last Year: Patient declined  Transportation Needs: No Transportation Needs (04/10/2023)   Received from [facility] - Transportation    Lack of Transportation (Medical): No    Lack of Transportation (Non-Medical): No  Physical Activity: Unknown (04/10/2023)   Received from [facility]   Exercise Vital Sign    Days of Exercise per Week: 0 days    Minutes of Exercise per Session: Not on file  Recent Concern:  Physical Activity - Inactive (03/12/2023)   Exercise Vital Sign    Days of Exercise per Week: 0 days    Minutes of Exercise per Session: 0 min  Stress: Stress Concern Present (04/10/2023)   Received from [facility] - Occupational Stress Questionnaire    Feeling of Stress : Rather much  Social Connections: Socially Isolated (04/10/2023)   Received from [facility]   Social Network    How would you rate your social network (family, work, friends)?: Little participation, lonely and socially isolated  Intimate Partner Violence: Not At Risk (04/10/2023)   Received from [facility]   HITS  Over the last 12 months how often did your partner physically hurt you?: Never    Over the last 12 months how often did your partner insult you or talk down to you?: Never    Over the last 12 months how often did your partner threaten you with physical harm?: Never    Over the last 12 months how often did your partner scream or curse at you?: Never   Family History  Problem Relation Age of Onset   Depression Mother        suicide   Alcohol abuse Father    Coronary artery disease Father    Heart disease Sister    Colon cancer Maternal Grandfather    Heart attack Brother    Lung cancer Brother    Breast cancer Neg Hx     Objective: Office vital signs reviewed. BP 125/76   Pulse 82   Temp 98.7 F (37.1 C)   Ht 5\' 8"  (1.727 m)   Wt 205 lb 9.6 oz (93.3 kg)   LMP 09/16/1994   BMI 31.26 kg/m   Physical Examination:  General: Awake, alert, well nourished, No acute distress HEENT: Normal    Neck: No masses palpated. No lymphadenopathy    Ears: Tympanic membranes intact, normal light reflex, no erythema, no bulging    Eyes: PERRLA, extraocular membranes intact, sclera white    Nose: nasal turbinates moist, clear nasal discharge    Throat: moist mucus membranes, no erythema, no tonsillar exudate.  Airway is patent Cardio: regular rate and rhythm,  S1S2 heard, no murmurs appreciated Pulm: clear to auscultation bilaterally, no wheezes, rhonchi or rales; normal work of breathing on room air Neuro: Follows commands.  Ambulating independently.     12/24/2023    4:09 PM 06/25/2023    3:53 PM 03/12/2023    3:12 PM  Depression screen PHQ 2/9  Decreased Interest 1 2 3   Down, Depressed, Hopeless 1 1 1   PHQ - 2 Score 2 3 4   Altered sleeping 3 1 1   Tired, decreased energy 3 1 3   Change in appetite 1 1 3   Feeling bad or failure about yourself  1 0 0  Trouble concentrating 1 0 2  Moving slowly or fidgety/restless 0 0 0  Suicidal thoughts 0 0 0  PHQ-9 Score 11 6 13   Difficult doing work/chores Not difficult at all Somewhat difficult       12/24/2023    4:12 PM 12/24/2023    4:09 PM 06/25/2023    3:53 PM 03/12/2023    3:12 PM  GAD 7 : Generalized Anxiety Score  Nervous, Anxious, on Edge  0 0 0  Control/stop worrying  0 0 0  Worry too much - different things 1 0 0 0  Trouble relaxing 1 0 0 0  Restless  0 0 0  Easily annoyed or irritable 1 0 0 0  Afraid - awful might happen  0 0 0  Total GAD 7 Score  0 0 0  Anxiety Difficulty  Not difficult at all Not difficult at all     Assessment/ Plan: 58 y.o. female   Acquired hypothyroidism - Plan: levothyroxine (SYNTHROID) 75 MCG tablet, TSH, T4, free  Stage 3a chronic kidney disease (HCC) - Plan: VITAMIN D 25 Hydroxy (Vit-D Deficiency, Fractures), CMP14+EGFR  Essential hypertension - Plan: CMP14+EGFR, losartan (COZAAR) 100 MG tablet  Pure hypercholesterolemia - Plan: Lipid panel  Seasonal allergic rhinitis due to other allergic trigger - Plan: fluticasone (FLONASE) 50 MCG/ACT nasal  spray, montelukast (SINGULAIR) 10 MG tablet, desloratadine (CLARINEX) 5 MG tablet  Narcolepsy due to underlying condition without cataplexy - Plan: Ambulatory referral to Neurology  Clinically euthymic.  Check thyroid levels, nonfasting labs.  Check renal panel.  Check vitamin D  She is to continue all  medications as prescribed.  I have added Clarinex for allergies and renewed her Flonase  Referral to neurology placed per her request in [facility] Hulen Skains, DO [facility] [phone]

## 2023-12-25 ENCOUNTER — Other Ambulatory Visit (HOSPITAL_COMMUNITY): Payer: Self-pay

## 2023-12-25 ENCOUNTER — Other Ambulatory Visit: Payer: Self-pay | Admitting: Family Medicine

## 2023-12-25 ENCOUNTER — Telehealth: Payer: Self-pay

## 2023-12-25 ENCOUNTER — Encounter: Payer: Self-pay | Admitting: Family Medicine

## 2023-12-25 DIAGNOSIS — E559 Vitamin D deficiency, unspecified: Secondary | ICD-10-CM

## 2023-12-25 DIAGNOSIS — E039 Hypothyroidism, unspecified: Secondary | ICD-10-CM

## 2023-12-25 LAB — CMP14+EGFR
ALT: 32 IU/L (ref 0–32)
AST: 54 IU/L — ABNORMAL HIGH (ref 0–40)
Albumin: 4.6 g/dL (ref 3.8–4.9)
Alkaline Phosphatase: 139 IU/L — ABNORMAL HIGH (ref 44–121)
BUN/Creatinine Ratio: 13 (ref 9–23)
BUN: 12 mg/dL (ref 6–24)
Bilirubin Total: 0.3 mg/dL (ref 0.0–1.2)
CO2: 22 mmol/L (ref 20–29)
Calcium: 9.5 mg/dL (ref 8.7–10.2)
Chloride: 101 mmol/L (ref 96–106)
Creatinine, Ser: 0.96 mg/dL (ref 0.57–1.00)
Globulin, Total: 2.9 g/dL (ref 1.5–4.5)
Glucose: 100 mg/dL — ABNORMAL HIGH (ref 70–99)
Potassium: 4.4 mmol/L (ref 3.5–5.2)
Sodium: 139 mmol/L (ref 134–144)
Total Protein: 7.5 g/dL (ref 6.0–8.5)
eGFR: 68 mL/min/{1.73_m2} (ref >59)

## 2023-12-25 LAB — LIPID PANEL
Chol/HDL Ratio: 2.1 ratio (ref 0.0–4.4)
Cholesterol, Total: 146 mg/dL (ref 100–199)
HDL: 69 mg/dL (ref >39)
LDL Chol Calc (NIH): 59 mg/dL (ref 0–99)
Triglycerides: 97 mg/dL (ref 0–149)
VLDL Cholesterol Cal: 18 mg/dL (ref 5–40)

## 2023-12-25 LAB — T4, FREE: Free T4: 0.75 ng/dL — ABNORMAL LOW (ref 0.82–1.77)

## 2023-12-25 LAB — TSH: TSH: 5.68 u[IU]/mL — ABNORMAL HIGH (ref 0.450–4.500)

## 2023-12-25 LAB — VITAMIN D 25 HYDROXY (VIT D DEFICIENCY, FRACTURES): Vit D, 25-Hydroxy: 18.6 ng/mL — ABNORMAL LOW (ref 30.0–100.0)

## 2023-12-25 MED ORDER — LEVOTHYROXINE SODIUM 88 MCG PO TABS: 88.0000 ug | ORAL_TABLET | Freq: Every day | ORAL | 3 refills | Status: AC

## 2023-12-25 MED ORDER — VITAMIN D (ERGOCALCIFEROL) 1.25 MG (50000 UNIT) PO CAPS: 50000.0000 [IU] | ORAL_CAPSULE | ORAL | 1 refills | Status: AC

## 2023-12-25 NOTE — Telephone Encounter (Signed)
See result encounter

## 2023-12-25 NOTE — Telephone Encounter (Signed)
 She has narcolepsy so I was trying to get her something that does not have a risk of drowsiness please

## 2023-12-25 NOTE — Telephone Encounter (Signed)
 Copied from CRM [id number]. Topic: Clinical - Lab/Test Results >> Dec 25, 2023  9:07 AM Izetta Dakin wrote: Reason for CRM: Patient returning missed call about lab results. Relayed results per provider's note, verbatim. Patient has additional questions, request a callback.

## 2023-12-25 NOTE — Telephone Encounter (Signed)
 Pharmacy Patient Advocate Encounter   Received notification from CoverMyMeds that prior authorization for Desloratadine 5MG  tablets is required/requested.   Insurance verification completed.   The patient is insured through [facility] .   Per test claim:Levocetirizine (XYZAL) 5 MG tablet is PREFERRED by the insurance test claim $4.00 per 34 day supply.  If suggested medication is appropriate, Please send in a new RX and discontinue Desloratadine 5MG  tablets.  Please be advised

## 2023-12-26 ENCOUNTER — Telehealth: Payer: Self-pay

## 2023-12-26 ENCOUNTER — Other Ambulatory Visit (HOSPITAL_COMMUNITY): Payer: Self-pay

## 2023-12-26 NOTE — Telephone Encounter (Signed)
 Pharmacy Patient Advocate Encounter  Received notification from [facility] that Prior Authorization for Desloratadine 5mg  TABLETS  has been APPROVED from 12/26/2023 to 12/25/2024. Ran test claim, Copay is $4.00. This test claim was processed through [facility]- copay amounts may vary at other pharmacies due to pharmacy/plan contracts, or as the patient moves through the different stages of their insurance plan.   PA #/Case ID/Reference #: [id number]  PLEASE BE ADVISED

## 2023-12-26 NOTE — Telephone Encounter (Signed)
 Pharmacy Patient Advocate Encounter   Received notification from CoverMyMeds that prior authorization for Desloratadine 5MG  tablets  is required/requested.   Insurance verification completed.   The patient is insured through [facility] .   Per test claim: PA required; PA submitted to above mentioned insurance via CoverMyMeds Key/confirmation #/EOC [id number] Status is pending

## 2023-12-26 NOTE — Telephone Encounter (Signed)
 Pharmacy Patient Advocate Encounter  Received notification from [facility] that Prior Authorization for Desloratadine 5MG  TABLETS has been APPROVED from 12/26/2023 to 12/26/2023   PA #/Case ID/Reference #: [id number]  SPOKE TO PHARMACY AND THEY WILL CONTACT PT WHEN PRESCRIPTION IS READY.

## 2024-01-29 NOTE — Progress Notes (Signed)
 All records are in [facility] and you can review there

## 2024-02-24 ENCOUNTER — Other Ambulatory Visit

## 2024-02-24 DIAGNOSIS — E559 Vitamin D deficiency, unspecified: Secondary | ICD-10-CM

## 2024-02-24 DIAGNOSIS — E039 Hypothyroidism, unspecified: Secondary | ICD-10-CM

## 2024-02-24 HISTORY — PX: TOOTH EXTRACTION: SUR596

## 2024-02-25 ENCOUNTER — Ambulatory Visit: Payer: Self-pay | Admitting: Family Medicine

## 2024-02-25 LAB — TSH+FREE T4
Free T4: 1.19 ng/dL (ref 0.82–1.77)
TSH: 1.36 u[IU]/mL (ref 0.450–4.500)

## 2024-02-25 LAB — VITAMIN D 25 HYDROXY (VIT D DEFICIENCY, FRACTURES): Vit D, 25-Hydroxy: 40.9 ng/mL (ref 30.0–100.0)

## 2024-03-17 ENCOUNTER — Ambulatory Visit: Payer: Self-pay | Admitting: Family Medicine

## 2024-03-17 ENCOUNTER — Ambulatory Visit (INDEPENDENT_AMBULATORY_CARE_PROVIDER_SITE_OTHER)

## 2024-03-17 ENCOUNTER — Ambulatory Visit: Admitting: Family Medicine

## 2024-03-17 VITALS — BP 146/84 | HR 76 | Temp 97.8°F | Ht 68.0 in | Wt 211.0 lb

## 2024-03-17 DIAGNOSIS — M25531 Pain in right wrist: Secondary | ICD-10-CM

## 2024-03-17 DIAGNOSIS — M25521 Pain in right elbow: Secondary | ICD-10-CM

## 2024-03-17 MED ORDER — MELOXICAM 15 MG PO TABS
15.0000 mg | ORAL_TABLET | Freq: Every day | ORAL | 0 refills | Status: DC | PRN
Start: 2024-03-17 — End: 2024-04-21

## 2024-03-17 NOTE — Progress Notes (Unsigned)
   Acute Office Visit  Subjective:     Patient ID: Colleen Sellers, female    DOB: 15-Aug-1966, 58 y.o.   MRN: [id number]  Chief Complaint  Patient presents with   Fall    Fall The accident occurred 12 to 24 hours ago. The fall occurred while standing (tripped over her dog). She landed on Hard floor. There was no blood loss. The pain is present in the right wrist and right elbow. The symptoms are aggravated by flexion, movement, rotation, extension and cold. Pertinent negatives include no numbness or tingling. She has tried ice (gabapentin and opiates for chronic pain) for the symptoms. The treatment provided no relief.  Achy pain along lateral right wrist and under wrist. Feels cold. Pain in elbow with flexion or extension.     ROS As per HPI.       Objective:    BP (!) 146/84   Pulse 76   Temp 97.8 F (36.6 C) (Temporal)   Ht 5' 8 (1.727 m)   Wt 211 lb (95.7 kg)   LMP 09/16/1994   SpO2 100%   BMI 32.08 kg/m  {Vitals History (Optional):23777}  Physical Exam Vitals and nursing note reviewed.  Constitutional:      General: She is not in acute distress.    Appearance: She is not ill-appearing, toxic-appearing or diaphoretic.  Cardiovascular:     Heart sounds: Normal heart sounds. No murmur heard. Pulmonary:     Effort: Pulmonary effort is normal. No respiratory distress.  Musculoskeletal:     Right elbow: Normal.     Right wrist: Swelling (mild) and tenderness (generalized) present. No deformity, effusion, snuff box tenderness or crepitus. Normal pulse.     Right hand: No swelling, deformity or tenderness. Normal range of motion. Normal capillary refill.  Skin:    General: Skin is warm and dry.  Neurological:     General: No focal deficit present.     Mental Status: She is alert.     No results found for any visits on 03/17/24.      Assessment & Plan:     No follow-ups on file.  Annabella CHRISTELLA Search, FNP

## 2024-03-18 ENCOUNTER — Encounter: Payer: Self-pay | Admitting: Family Medicine

## 2024-03-31 DIAGNOSIS — G4719 Other hypersomnia: Secondary | ICD-10-CM | POA: Diagnosis not present

## 2024-03-31 DIAGNOSIS — G40909 Epilepsy, unspecified, not intractable, without status epilepticus: Secondary | ICD-10-CM | POA: Diagnosis not present

## 2024-04-14 DIAGNOSIS — M25531 Pain in right wrist: Secondary | ICD-10-CM | POA: Diagnosis not present

## 2024-04-14 DIAGNOSIS — Z5181 Encounter for therapeutic drug level monitoring: Secondary | ICD-10-CM | POA: Diagnosis not present

## 2024-04-14 DIAGNOSIS — M797 Fibromyalgia: Secondary | ICD-10-CM | POA: Diagnosis not present

## 2024-04-14 DIAGNOSIS — Z79891 Long term (current) use of opiate analgesic: Secondary | ICD-10-CM | POA: Diagnosis not present

## 2024-04-14 DIAGNOSIS — M47816 Spondylosis without myelopathy or radiculopathy, lumbar region: Secondary | ICD-10-CM | POA: Diagnosis not present

## 2024-04-14 DIAGNOSIS — M51362 Other intervertebral disc degeneration, lumbar region with discogenic back pain and lower extremity pain: Secondary | ICD-10-CM | POA: Diagnosis not present

## 2024-04-14 DIAGNOSIS — G894 Chronic pain syndrome: Secondary | ICD-10-CM | POA: Diagnosis not present

## 2024-04-21 ENCOUNTER — Other Ambulatory Visit: Payer: Self-pay | Admitting: Family Medicine

## 2024-04-21 ENCOUNTER — Other Ambulatory Visit: Payer: Self-pay | Admitting: Obstetrics & Gynecology

## 2024-04-21 ENCOUNTER — Telehealth: Payer: Self-pay

## 2024-04-21 DIAGNOSIS — K5909 Other constipation: Secondary | ICD-10-CM

## 2024-04-21 DIAGNOSIS — M25521 Pain in right elbow: Secondary | ICD-10-CM

## 2024-04-21 DIAGNOSIS — M25531 Pain in right wrist: Secondary | ICD-10-CM

## 2024-04-21 NOTE — Telephone Encounter (Signed)
 Patient is taking a capful once a day

## 2024-04-21 NOTE — Telephone Encounter (Signed)
 Copied from CRM #[id number]. Topic: Clinical - Medication Question >> Apr 21, 2024 12:13 PM Susanna ORN wrote: Reason for CRM: Patient called wanting to know if Dr. Jolinda can write a prescription for two polyethylene glycol powder (GLYCOLAX /MIRALAX ) 17 GM/SCOOP powder. She states one bottle is only lasting her for 15 days and she's having to pay double. Please give patient a call to advise if this can be written for the requested amount. CB #: [id number]. Pharmacy is the correct pharmacy on file.

## 2024-04-21 NOTE — Telephone Encounter (Signed)
 Looks like this was prescribed by provider at [facility].  Can you verify how she is taking the medication?  Is she using it once or twice a day and 1 capful per dose?

## 2024-04-22 MED ORDER — POLYETHYLENE GLYCOL 3350 17 GM/SCOOP PO POWD: ORAL | 11 refills | Status: AC

## 2024-04-22 NOTE — Telephone Encounter (Signed)
 done

## 2024-04-22 NOTE — Addendum Note (Signed)
 Addended by: JOLINDA NORENE HERO on: 04/22/2024 09:06 AM   Modules accepted: Orders

## 2024-04-22 NOTE — Telephone Encounter (Signed)
 Left message advising requested medication sent to pharmacy and to call back with any further questions or concerns.

## 2024-06-25 ENCOUNTER — Telehealth: Payer: Self-pay

## 2024-06-25 ENCOUNTER — Encounter: Payer: Self-pay | Admitting: Family Medicine

## 2024-06-25 ENCOUNTER — Ambulatory Visit: Admitting: Family Medicine

## 2024-06-25 VITALS — BP 145/84 | HR 63 | Temp 98.0°F | Ht 68.0 in | Wt 219.2 lb

## 2024-06-25 DIAGNOSIS — R319 Hematuria, unspecified: Secondary | ICD-10-CM | POA: Diagnosis not present

## 2024-06-25 DIAGNOSIS — N1831 Chronic kidney disease, stage 3a: Secondary | ICD-10-CM

## 2024-06-25 DIAGNOSIS — E7800 Pure hypercholesterolemia, unspecified: Secondary | ICD-10-CM

## 2024-06-25 DIAGNOSIS — I10 Essential (primary) hypertension: Secondary | ICD-10-CM

## 2024-06-25 DIAGNOSIS — E039 Hypothyroidism, unspecified: Secondary | ICD-10-CM | POA: Diagnosis not present

## 2024-06-25 DIAGNOSIS — R609 Edema, unspecified: Secondary | ICD-10-CM

## 2024-06-25 DIAGNOSIS — Z0001 Encounter for general adult medical examination with abnormal findings: Secondary | ICD-10-CM

## 2024-06-25 DIAGNOSIS — R0683 Snoring: Secondary | ICD-10-CM

## 2024-06-25 DIAGNOSIS — Z0000 Encounter for general adult medical examination without abnormal findings: Secondary | ICD-10-CM

## 2024-06-25 DIAGNOSIS — Z8249 Family history of ischemic heart disease and other diseases of the circulatory system: Secondary | ICD-10-CM

## 2024-06-25 DIAGNOSIS — G35D Multiple sclerosis, unspecified: Secondary | ICD-10-CM

## 2024-06-25 DIAGNOSIS — G4719 Other hypersomnia: Secondary | ICD-10-CM | POA: Diagnosis not present

## 2024-06-25 LAB — URINALYSIS, ROUTINE W REFLEX MICROSCOPIC
Bilirubin, UA: NEGATIVE
Glucose, UA: NEGATIVE
Ketones, UA: NEGATIVE
Nitrite, UA: NEGATIVE
Protein,UA: NEGATIVE
RBC, UA: NEGATIVE
Specific Gravity, UA: >=1.03 — AB (ref 1.005–1.030)
Urobilinogen, Ur: 0.2 mg/dL (ref 0.2–1.0)
pH, UA: 5.5 (ref 5.0–7.5)

## 2024-06-25 LAB — MICROSCOPIC EXAMINATION
RBC, Urine: NONE SEEN /HPF (ref 0–2)
Renal Epithel, UA: NONE SEEN /HPF

## 2024-06-25 MED ORDER — FUROSEMIDE 20 MG PO TABS: 20.0000 mg | ORAL_TABLET | Freq: Every day | ORAL | 3 refills | Status: AC | PRN

## 2024-06-25 NOTE — Patient Instructions (Signed)

## 2024-06-25 NOTE — Telephone Encounter (Signed)
 Left voicemail for patient to call back, patient has appointment today at 3:30 but Dr. Jolinda has an opening at 3 today and wanted to know if patient could come in at that time.

## 2024-06-25 NOTE — Progress Notes (Signed)
 Colleen Sellers is a 58 y.o. female presents to office today for annual physical exam examination.    She reports weight gain with gabapentin and therefore she discontinued it.  She continues to be managed with her other pain medications and those have not changed.  She reports snoring, poor energy during the daytime.  She still was never started back on stimulant medication for her narcolepsy but also has not called to follow-up with neurology about this.  She reports frequent drooling in the middle of night.  Family history is significant for both arrhythmia and coronary artery disease.  Her brother actually died from a massive heart attack and her grandmother had a pacemaker and also had obstructive sleep apnea.  Cardiology before and it looks like they ordered lipoprotein a but she never had it done.  She is never had a sleep study done.  She does report occasional fluid retention which kind of that collects in her abdomen area  She has history of hematuria and still sees intermittent scant hematuria when she wipes.  She has had a total hysterectomy so that does not think it is coming vaginally.  Used to see Dr. Roseann in [place] for urology.  Denies any dysuria.  No fevers   Occupation: doesn't work, Substance use: none Health Maintenance Due  Topic Date Due   Pneumococcal Vaccine: 50+ Years (1 of 2 - PCV) Never done   Zoster Vaccines- Shingrix (1 of 2) Never done   Influenza Vaccine  04/16/2024    Immunization History  Administered Date(s) Administered   Influenza,inj,Quad PF,6+ Mos 08/20/2018   Tdap 03/15/2012, 06/25/2023   Past Medical History:  Diagnosis Date   Acid reflux    Anxiety    Arthritis    Asthma    Chest pain    Dyspnea, diaphoresis   CHF (congestive heart failure) (HCC)    Chronic back pain    Degenerative joint disease    Also scoliosis and pectus deformity   Depression    Fibromyalgia    Hyperlipidemia    IBS (irritable bowel syndrome)    Mild  hematochezia; negative for celiac disease; mild Schatzki's ring; small hiatal hernia; minor gastric erosions; few diverticula   MS (multiple sclerosis)    Overweight(278.02)    Negative sleep study-2010, but treated for narcolepsy   Seizure disorder [facility])    Surgical menopause 09/16/2000   age 37   Social History   Socioeconomic History   Marital status: Divorced    Spouse name: Not on file   Number of children: 4   Years of education: Not on file   Highest education level: Not on file  Occupational History   Occupation: Disabled    Comment: Fibromyalgia, arthritis  Tobacco Use   Smoking status: Never   Smokeless tobacco: Never  Vaping Use   Vaping status: Never Used  Substance and Sexual Activity   Alcohol use: No    Alcohol/week: 0.0 standard drinks of alcohol    Comment: minimal use   Drug use: No   Sexual activity: Not Currently    Birth control/protection: Post-menopausal, Surgical    Comment: hyst  Other Topics Concern   Not on file  Social History Narrative   Expecting a granddaughter in march 2024.   Social Drivers of Health   Financial Resource Strain: Medium Risk (04/13/2024)   Received from [facility]   Overall Financial Resource Strain (CARDIA)    How hard is it for you to pay for the  very basics like food, housing, medical care, and heating?: Somewhat hard  Food Insecurity: No Food Insecurity (04/13/2024)   Received from [facility]   Hunger Vital Sign    Within the past 12 months, you worried that your food would run out before you got the money to buy more.: Never true    Within the past 12 months, the food you bought just didn't last and you didn't have money to get more.: Never true  Transportation Needs: No Transportation Needs (04/13/2024)   Received from [facility] - Transportation    In the past 12 months, has lack of transportation kept you from medical appointments or from getting medications?: No    In the past 12 months, has  lack of transportation kept you from meetings, work, or from getting things needed for daily living?: No  Physical Activity: Inactive (04/13/2024)   Received from [facility]   Exercise Vital Sign    On average, how many days per week do you engage in moderate to strenuous exercise (like a brisk walk)?: 0 days    Minutes of Exercise per Session: Not on file  Stress: No Stress Concern Present (04/13/2024)   Received from [facility] - Occupational Stress Questionnaire    Do you feel stress - tense, restless, nervous, or anxious, or unable to sleep at night because your mind is troubled all the time - these days?: Only a little  Social Connections: Somewhat Isolated (04/13/2024)   Received from [facility]   Social Network    How would you rate your social network (family, work, friends)?: Restricted participation with some degree of social isolation  Intimate Partner Violence: Not At Risk (04/13/2024)   Received from [facility]   HITS    Over the last 12 months how often did your partner physically hurt you?: Never    Over the last 12 months how often did your partner insult you or talk down to you?: Never    Over the last 12 months how often did your partner threaten you with physical harm?: Never    Over the last 12 months how often did your partner scream or curse at you?: Never   Past Surgical History:  Procedure Laterality Date   BRAIN SURGERY     CHOLECYSTECTOMY     COLONOSCOPY  2001, 2010   negative/friable anal canal scattered pancolonic diverticula   ESOPHAGOGASTRODUODENOSCOPY  11/22/08   noncritical Schatzi ring/small hiatal herina/tiny antral reosions   ESOPHAGOGASTRODUODENOSCOPY (EGD) WITH PROPOFOL  N/A 08/22/2022   Procedure: ESOPHAGOGASTRODUODENOSCOPY (EGD) WITH PROPOFOL ;  Surgeon: Shaaron Lamar HERO, MD;  [facility]: [facility];  Service: Endoscopy;  Laterality: N/A;  9:00am, asa 2   MALONEY DILATION N/A 08/22/2022   Procedure:  MALONEY DILATION;  Surgeon: Shaaron Lamar HERO, MD;  Location: [facility];  Service: Endoscopy;  Laterality: N/A;   RHINOPLASTY     Deviated septum   SALPINGOOPHORECTOMY  2002   Bilateral   TUBAL LIGATION     VAGINAL HYSTERECTOMY  1996   Family History  Problem Relation Age of Onset   Depression Mother        suicide   Alcohol abuse Father    Coronary artery disease Father    Heart disease Sister    Colon cancer Maternal Grandfather    Heart attack Brother    Lung cancer Brother    Breast cancer Neg Hx     Current Outpatient Medications:  amLODipine  (NORVASC ) 5 MG tablet, Take 1 tablet (5 mg total) by mouth daily., Disp: 90 tablet, Rfl: 3   cholecalciferol (VITAMIN D3) 25 MCG (1000 UNIT) tablet, Take 1,000 Units by mouth daily., Disp: , Rfl:    conjugated estrogens  (PREMARIN ) vaginal cream, Place 1 Applicatorful vaginally daily. Use 1 gram nightly (Patient not taking: Reported on 03/17/2024), Disp: 30 g, Rfl: 12   cyanocobalamin (VITAMIN B12) 1000 MCG/ML injection, Inject into the muscle. (Patient not taking: Reported on 03/17/2024), Disp: , Rfl:    desloratadine  (CLARINEX ) 5 MG tablet, Take 1 tablet (5 mg total) by mouth daily., Disp: 90 tablet, Rfl: 3   fluticasone  (FLONASE ) 50 MCG/ACT nasal spray, Place 2 sprays into both nostrils daily., Disp: 16 g, Rfl: 6   gabapentin (NEURONTIN) 300 MG capsule, Take 300 mg by mouth 3 (three) times daily., Disp: , Rfl:    levothyroxine  (SYNTHROID ) 88 MCG tablet, Take 1 tablet (88 mcg total) by mouth daily. **dose change, Disp: 90 tablet, Rfl: 3   losartan  (COZAAR ) 100 MG tablet, Take 1 tablet (100 mg total) by mouth daily., Disp: 90 tablet, Rfl: 3   melatonin 5 MG TABS, Take 5 mg by mouth at bedtime., Disp: , Rfl:    meloxicam  (MOBIC ) 15 MG tablet, TAKE 1 TABLET BY MOUTH EVERY DAY AS NEEDED FOR PAIN, Disp: 30 tablet, Rfl: 1   metoprolol  tartrate (LOPRESSOR ) 100 MG tablet, Take 1 tablet (100 mg total) by mouth as directed. Take 2 hours prior to CT  Scan, Disp: 1 tablet, Rfl: 0   montelukast  (SINGULAIR ) 10 MG tablet, Take 1 tablet (10 mg total) by mouth at bedtime., Disp: 90 tablet, Rfl: 3   oxyCODONE -acetaminophen  (PERCOCET) 10-325 MG tablet, Take 1 tablet by mouth every 8 (eight) hours as needed for pain., Disp: , Rfl:    OXYCONTIN  10 MG 12 hr tablet, Take 10 mg by mouth 2 (two) times daily., Disp: , Rfl:    pantoprazole  (PROTONIX ) 40 MG tablet, Take 1 tablet (40 mg total) by mouth daily. (Patient not taking: Reported on 03/17/2024), Disp: 30 tablet, Rfl: 11   polyethylene glycol powder (GLYCOLAX /MIRALAX ) 17 GM/SCOOP powder, Mix 1 capful in 8 ounces of water and drink daily or as needed for constipation, Disp: 578 g, Rfl: 11   rosuvastatin  (CRESTOR ) 20 MG tablet, Take 1 tablet (20 mg total) by mouth daily., Disp: 90 tablet, Rfl: 3   tiZANidine (ZANAFLEX) 4 MG tablet, Take 4 mg by mouth at bedtime., Disp: , Rfl:    Vitamin D , Ergocalciferol , (DRISDOL ) 1.25 MG (50000 UNIT) CAPS capsule, Take 1 capsule (50,000 Units total) by mouth every 7 (seven) days. X6 months. Then switch to OTC Vit D 800 international units dialy., Disp: 12 capsule, Rfl: 1  Allergies  Allergen Reactions   Ceftin Other (See Comments)    Unknown   Codeine Nausea And Vomiting   Morphine Hives and Itching    IV   Adhesive [Tape] Rash   Latex Rash     ROS: Review of Systems Pertinent items noted in HPI and remainder of comprehensive ROS otherwise negative.    Physical exam BP (!) 145/84   Pulse 63   Temp 98 F (36.7 C)   Ht 5' 8 (1.727 m)   Wt 219 lb 4 oz (99.5 kg)   LMP 09/16/1994   SpO2 92%   BMI 33.34 kg/m  General appearance: alert, cooperative, appears stated age, no distress, and moderately obese Head: Normocephalic, without obvious abnormality, atraumatic Eyes: negative findings: lids and  lashes normal, conjunctivae and sclerae normal, corneas clear, and pupils equal, round, reactive to light and accomodation Ears: normal TM's and external ear canals  both ears Nose: Nares normal. Septum midline. Mucosa normal. No drainage or sinus tenderness. Throat: lips, mucosa, and tongue normal; teeth and gums normal Neck: no adenopathy, no carotid bruit, supple, symmetrical, trachea midline, and thyroid  not enlarged, symmetric, no tenderness/mass/nodules Back: Increased kyphosis of the thoracic spine with limited extension of C-spine as well.  She has hunched station but ambulates independently Lungs: clear to auscultation bilaterally Heart: regular rate and rhythm, S1, S2 normal, no murmur, click, rub or gallop Abdomen: soft, non-tender; bowel sounds normal; no masses,  no organomegaly Extremities: extremities normal, atraumatic, no cyanosis or edema Pulses: 2+ and symmetric Skin: Skin color, texture, turgor normal. No rashes or lesions Lymph nodes: Cervical, supraclavicular, and axillary nodes normal. Neurologic: Alert and oriented X 3, normal strength and tone. Normal symmetric reflexes. Normal coordination and gait      12/24/2023    4:09 PM 06/25/2023    3:53 PM 03/12/2023    3:12 PM  Depression screen PHQ 2/9  Decreased Interest 1 2 3   Down, Depressed, Hopeless 1 1 1   PHQ - 2 Score 2 3 4   Altered sleeping 3 1 1   Tired, decreased energy 3 1 3   Change in appetite 1 1 3   Feeling bad or failure about yourself  1 0 0  Trouble concentrating 1 0 2  Moving slowly or fidgety/restless 0 0 0  Suicidal thoughts 0 0 0  PHQ-9 Score 11 6 13   Difficult doing work/chores Not difficult at all Somewhat difficult       12/24/2023    4:12 PM 12/24/2023    4:09 PM 06/25/2023    3:53 PM 03/12/2023    3:12 PM  GAD 7 : Generalized Anxiety Score  Nervous, Anxious, on Edge  0 0 0  Control/stop worrying  0 0 0  Worry too much - different things 1 0 0 0  Trouble relaxing 1 0 0 0  Restless  0 0 0  Easily annoyed or irritable 1 0 0 0  Afraid - awful might happen  0 0 0  Total GAD 7 Score  0 0 0  Anxiety Difficulty  Not difficult at all Not difficult at all      No results found for this or any previous visit (from the past 2160 hours).   Assessment/ Plan: Doyal JONETTA Poli here for annual physical exam.   Annual physical exam  Snoring - Plan: Ambulatory referral to Sleep Studies  Excessive daytime sleepiness - Plan: Vitamin B12, Ambulatory referral to Sleep Studies  Hematuria, unspecified type - Plan: Urinalysis, Routine w reflex microscopic  Stage 3a chronic kidney disease (HCC) - Plan: CMP14+EGFR, CBC with Differential, furosemide (LASIX) 20 MG tablet  Fluid retention - Plan: furosemide (LASIX) 20 MG tablet  Essential hypertension - Plan: CMP14+EGFR, Lipoprotein A (LPA), furosemide (LASIX) 20 MG tablet  Pure hypercholesterolemia - Plan: CMP14+EGFR, Lipid Panel, Lipoprotein A (LPA)  Family history of early CAD - Plan: Lipoprotein A (LPA)  Acquired hypothyroidism - Plan: TSH + free T4, CMP14+EGFR  Multiple sclerosis - Plan: CMP14+EGFR, CBC with Differential   Stop bang score of 3.  Referral to home sleep study placed.  Possible consideration for Zepbound given cardiovascular risk and obesity  Will check B12, thyroid  levels as well  Has history of hematuria may be related to renal stone.  On rechecking a urinalysis and if  still positive plan to refer back to [facility] for further evaluation  Check renal function, CBC given CKD 3A.  Vitamin D  done earlier this year.  Have given her Lasix for as needed use.  Use as needed  Blood pressure not at goal but I expect that if she is using Lasix we will see a drop in her blood pressure and of course if she is diagnosed with obstructive sleep apnea and this is treated we should see a natural decline in blood pressure as well  Lipoprotein a, lipid panel, nonfasting, placed  MS is stable and treated with chronic pain medication Counseled on healthy lifestyle choices, including diet (rich in fruits, vegetables and lean meats and low in salt and simple carbohydrates) and exercise (at  least 30 minutes of moderate physical activity daily).  Patient to follow up 11m  Kenny Rea M. Jolinda, DO

## 2024-06-28 ENCOUNTER — Ambulatory Visit: Payer: Self-pay | Admitting: Family Medicine

## 2024-06-28 DIAGNOSIS — R7989 Other specified abnormal findings of blood chemistry: Secondary | ICD-10-CM

## 2024-06-28 DIAGNOSIS — B3731 Acute candidiasis of vulva and vagina: Secondary | ICD-10-CM

## 2024-06-28 LAB — CBC WITH DIFFERENTIAL/PLATELET
Basophils Absolute: 0.1 x10E3/uL (ref 0.0–0.2)
Basos: 1 %
EOS (ABSOLUTE): 0.5 x10E3/uL — ABNORMAL HIGH (ref 0.0–0.4)
Eos: 5 %
Hematocrit: 40.8 % (ref 34.0–46.6)
Hemoglobin: 13.1 g/dL (ref 11.1–15.9)
Immature Grans (Abs): 0 x10E3/uL (ref 0.0–0.1)
Immature Granulocytes: 0 %
Lymphocytes Absolute: 3.9 x10E3/uL — ABNORMAL HIGH (ref 0.7–3.1)
Lymphs: 38 %
MCH: 30.3 pg (ref 26.6–33.0)
MCHC: 32.1 g/dL (ref 31.5–35.7)
MCV: 94 fL (ref 79–97)
Monocytes Absolute: 1 x10E3/uL — ABNORMAL HIGH (ref 0.1–0.9)
Monocytes: 10 %
Neutrophils Absolute: 4.9 x10E3/uL (ref 1.4–7.0)
Neutrophils: 46 %
Platelets: 469 x10E3/uL — ABNORMAL HIGH (ref 150–450)
RBC: 4.32 x10E6/uL (ref 3.77–5.28)
RDW: 12 % (ref 11.7–15.4)
WBC: 10.3 x10E3/uL (ref 3.4–10.8)

## 2024-06-28 LAB — CMP14+EGFR
ALT: 15 IU/L (ref 0–32)
AST: 20 IU/L (ref 0–40)
Albumin: 4.7 g/dL (ref 3.8–4.9)
Alkaline Phosphatase: 105 IU/L (ref 49–135)
BUN/Creatinine Ratio: 18 (ref 12–28)
BUN: 20 mg/dL (ref 8–27)
Bilirubin Total: 0.3 mg/dL (ref 0.0–1.2)
CO2: 20 mmol/L (ref 20–29)
Calcium: 9.8 mg/dL (ref 8.7–10.3)
Chloride: 100 mmol/L (ref 96–106)
Creatinine, Ser: 1.11 mg/dL — ABNORMAL HIGH (ref 0.57–1.00)
Globulin, Total: 2.9 g/dL (ref 1.5–4.5)
Glucose: 90 mg/dL (ref 70–99)
Potassium: 4.2 mmol/L (ref 3.5–5.2)
Sodium: 139 mmol/L (ref 134–144)
Total Protein: 7.6 g/dL (ref 6.0–8.5)
eGFR: 57 mL/min/1.73 — ABNORMAL LOW (ref >59)

## 2024-06-28 LAB — LIPID PANEL
Chol/HDL Ratio: 2.1 ratio (ref 0.0–4.4)
Cholesterol, Total: 147 mg/dL (ref 100–199)
HDL: 70 mg/dL (ref >39)
LDL Chol Calc (NIH): 62 mg/dL (ref 0–99)
Triglycerides: 81 mg/dL (ref 0–149)
VLDL Cholesterol Cal: 15 mg/dL (ref 5–40)

## 2024-06-28 LAB — TSH+FREE T4
Free T4: 1.14 ng/dL (ref 0.82–1.77)
TSH: 4.08 u[IU]/mL (ref 0.450–4.500)

## 2024-06-28 LAB — LIPOPROTEIN A (LPA): Lipoprotein (a): 10.5 nmol/L (ref <75.0)

## 2024-06-28 LAB — VITAMIN B12: Vitamin B-12: >2000 pg/mL — ABNORMAL HIGH (ref 232–1245)

## 2024-06-28 MED ORDER — FLUCONAZOLE 150 MG PO TABS: 150.0000 mg | ORAL_TABLET | Freq: Once | ORAL | 0 refills | Status: AC

## 2024-06-28 NOTE — Telephone Encounter (Signed)
 Patient aware and verbalized understanding. Lab appointment scheduled for follow up bloodwork.

## 2024-07-12 ENCOUNTER — Telehealth: Payer: Self-pay | Admitting: Internal Medicine

## 2024-07-12 DIAGNOSIS — Z79899 Other long term (current) drug therapy: Secondary | ICD-10-CM

## 2024-07-12 DIAGNOSIS — E785 Hyperlipidemia, unspecified: Secondary | ICD-10-CM

## 2024-07-12 MED ORDER — ROSUVASTATIN CALCIUM 10 MG PO TABS: 10.0000 mg | ORAL_TABLET | Freq: Every day | ORAL | 3 refills | Status: AC

## 2024-07-12 NOTE — Telephone Encounter (Signed)
 Colleen Vina GAILS, MD to [name], RN    06/29/24 10:12 AM Result Note IT has been almost 1 year since I saw patient  Lpa is low   That is good  Given normal CCTA, I would cut back on Crstor to 10 mg     Follow NMR panel in 13 wks       The patient has been notified of the result and verbalized understanding.  All questions (if any) were answered. Bernett Dorothyann LABOR, RN 07/12/2024 10:54 AM    Patient will have f/u labs at [facility]

## 2024-07-12 NOTE — Telephone Encounter (Signed)
 Patient states she was returning call. Please advise  ?

## 2024-07-13 ENCOUNTER — Other Ambulatory Visit

## 2024-07-13 DIAGNOSIS — R7989 Other specified abnormal findings of blood chemistry: Secondary | ICD-10-CM | POA: Diagnosis not present

## 2024-07-14 ENCOUNTER — Ambulatory Visit: Payer: Self-pay | Admitting: Family Medicine

## 2024-07-14 LAB — CBC WITH DIFFERENTIAL/PLATELET
Basophils Absolute: 0.1 x10E3/uL (ref 0.0–0.2)
Basos: 1 %
EOS (ABSOLUTE): 0.5 x10E3/uL — ABNORMAL HIGH (ref 0.0–0.4)
Eos: 5 %
Hematocrit: 37.3 % (ref 34.0–46.6)
Hemoglobin: 12.2 g/dL (ref 11.1–15.9)
Immature Grans (Abs): 0 x10E3/uL (ref 0.0–0.1)
Immature Granulocytes: 0 %
Lymphocytes Absolute: 3.4 x10E3/uL — ABNORMAL HIGH (ref 0.7–3.1)
Lymphs: 36 %
MCH: 31 pg (ref 26.6–33.0)
MCHC: 32.7 g/dL (ref 31.5–35.7)
MCV: 95 fL (ref 79–97)
Monocytes Absolute: 0.9 x10E3/uL (ref 0.1–0.9)
Monocytes: 9 %
Neutrophils Absolute: 4.6 x10E3/uL (ref 1.4–7.0)
Neutrophils: 49 %
Platelets: 371 x10E3/uL (ref 150–450)
RBC: 3.93 x10E6/uL (ref 3.77–5.28)
RDW: 11.9 % (ref 11.7–15.4)
WBC: 9.4 x10E3/uL (ref 3.4–10.8)

## 2024-07-15 ENCOUNTER — Other Ambulatory Visit: Payer: Self-pay | Admitting: Family Medicine

## 2024-07-15 DIAGNOSIS — Z1231 Encounter for screening mammogram for malignant neoplasm of breast: Secondary | ICD-10-CM

## 2024-07-20 DIAGNOSIS — M797 Fibromyalgia: Secondary | ICD-10-CM | POA: Diagnosis not present

## 2024-07-20 DIAGNOSIS — G894 Chronic pain syndrome: Secondary | ICD-10-CM | POA: Diagnosis not present

## 2024-07-20 DIAGNOSIS — M47816 Spondylosis without myelopathy or radiculopathy, lumbar region: Secondary | ICD-10-CM | POA: Diagnosis not present

## 2024-07-20 DIAGNOSIS — M51362 Other intervertebral disc degeneration, lumbar region with discogenic back pain and lower extremity pain: Secondary | ICD-10-CM | POA: Diagnosis not present

## 2024-07-20 DIAGNOSIS — Z79891 Long term (current) use of opiate analgesic: Secondary | ICD-10-CM | POA: Diagnosis not present

## 2024-07-20 DIAGNOSIS — Z0283 Encounter for blood-alcohol and blood-drug test: Secondary | ICD-10-CM | POA: Diagnosis not present

## 2024-07-20 DIAGNOSIS — Z5181 Encounter for therapeutic drug level monitoring: Secondary | ICD-10-CM | POA: Diagnosis not present

## 2024-07-26 ENCOUNTER — Ambulatory Visit
Admission: RE | Admit: 2024-07-26 | Discharge: 2024-07-26 | Disposition: A | Discharge status: Home / Self Care | Payer: Medicaid Other | Source: Ambulatory Visit | Attending: Family Medicine | Admitting: Family Medicine

## 2024-07-26 DIAGNOSIS — Z1231 Encounter for screening mammogram for malignant neoplasm of breast: Secondary | ICD-10-CM

## 2024-08-06 ENCOUNTER — Encounter: Payer: Self-pay | Admitting: Family Medicine

## 2024-08-06 DIAGNOSIS — J9801 Acute bronchospasm: Secondary | ICD-10-CM

## 2024-08-06 MED ORDER — ALBUTEROL SULFATE HFA 108 (90 BASE) MCG/ACT IN AERS: 2.0000 | INHALATION_SPRAY | Freq: Four times a day (QID) | RESPIRATORY_TRACT | 0 refills | Status: DC | PRN

## 2024-09-07 ENCOUNTER — Other Ambulatory Visit: Payer: Self-pay | Admitting: Family Medicine

## 2024-09-07 DIAGNOSIS — J9801 Acute bronchospasm: Secondary | ICD-10-CM

## 2024-09-24 ENCOUNTER — Telehealth: Payer: Self-pay | Admitting: *Deleted

## 2024-09-24 DIAGNOSIS — N1831 Chronic kidney disease, stage 3a: Secondary | ICD-10-CM

## 2024-09-24 NOTE — Progress Notes (Unsigned)
 Complex Care Management Note Care Guide Note  09/24/2024 Name: Colleen Sellers MRN: [id number] DOB: 31-Dec-1965   Complex Care Management Outreach Attempts: An unsuccessful telephone outreach was attempted today to offer the patient information about available complex care management services.  Follow Up Plan:  Additional outreach attempts will be made to offer the patient complex care management information and services.   Encounter Outcome:  No Answer  Harlene Satterfield  [facility]  [facility], [facility] Guide  Direct Dial: [phone]  Fax [phone]

## 2024-09-27 NOTE — Progress Notes (Unsigned)
 Complex Care Management Note Care Guide Note  09/27/2024 Name: Colleen Sellers MRN: [id number] DOB: [birth year]   Complex Care Management Outreach Attempts: An unsuccessful telephone outreach was attempted today to offer the patient information about available complex care management services.  Follow Up Plan:  Additional outreach attempts will be made to offer the patient complex care management information and services.   Encounter Outcome:  No Answer Harlene Satterfield  [facility]  [facility], [facility] Guide  Direct Dial: [phone]  Fax [phone]

## 2024-09-28 NOTE — Progress Notes (Signed)
 Complex Care Management Note Care Guide Note  09/28/2024 Name: Colleen Sellers MRN: [id number] DOB: [birth year]   Complex Care Management Outreach Attempts: A third unsuccessful outreach was attempted today to offer the patient with information about available complex care management services.  Follow Up Plan:  No further outreach attempts will be made at this time. We have been unable to contact the patient to offer or enroll patient in complex care management services.  Encounter Outcome:  No Answer  Harlene Satterfield  [facility]  [facility], [facility] Guide  Direct Dial: [phone]  Fax [phone]

## 2024-12-24 ENCOUNTER — Ambulatory Visit: Payer: Self-pay | Admitting: Family Medicine
